# Patient Record
Sex: Male | Born: 1950 | Race: White | Hispanic: No | Marital: Married | Smoking: Former smoker
Health system: Southern US, Community
[De-identification: ages and names within clinical notes are randomized; demographics above are authoritative.]

## PROBLEM LIST (undated history)

## (undated) DIAGNOSIS — Z9289 Personal history of other medical treatment: Secondary | ICD-10-CM

## (undated) DIAGNOSIS — I739 Peripheral vascular disease, unspecified: Secondary | ICD-10-CM

## (undated) DIAGNOSIS — E785 Hyperlipidemia, unspecified: Secondary | ICD-10-CM

## (undated) DIAGNOSIS — R0602 Shortness of breath: Secondary | ICD-10-CM

## (undated) DIAGNOSIS — L89519 Pressure ulcer of right ankle, unspecified stage: Secondary | ICD-10-CM

## (undated) DIAGNOSIS — N189 Chronic kidney disease, unspecified: Secondary | ICD-10-CM

## (undated) DIAGNOSIS — I2510 Atherosclerotic heart disease of native coronary artery without angina pectoris: Secondary | ICD-10-CM

## (undated) DIAGNOSIS — M79606 Pain in leg, unspecified: Secondary | ICD-10-CM

## (undated) DIAGNOSIS — I509 Heart failure, unspecified: Secondary | ICD-10-CM

## (undated) DIAGNOSIS — M069 Rheumatoid arthritis, unspecified: Secondary | ICD-10-CM

## (undated) DIAGNOSIS — I10 Essential (primary) hypertension: Secondary | ICD-10-CM

## (undated) DIAGNOSIS — A419 Sepsis, unspecified organism: Secondary | ICD-10-CM

## (undated) DIAGNOSIS — K219 Gastro-esophageal reflux disease without esophagitis: Secondary | ICD-10-CM

## (undated) DIAGNOSIS — I219 Acute myocardial infarction, unspecified: Secondary | ICD-10-CM

## (undated) DIAGNOSIS — Z95810 Presence of automatic (implantable) cardiac defibrillator: Secondary | ICD-10-CM

## (undated) HISTORY — DX: Acute myocardial infarction, unspecified: I21.9

## (undated) HISTORY — PX: KNEE SURGERY: SHX244

## (undated) HISTORY — PX: CARPAL TUNNEL RELEASE: SHX101

## (undated) HISTORY — DX: Heart failure, unspecified: I50.9

## (undated) HISTORY — DX: Pain in leg, unspecified: M79.606

## (undated) HISTORY — PX: HERNIA REPAIR: SHX51

## (undated) HISTORY — DX: Hyperlipidemia, unspecified: E78.5

## (undated) HISTORY — PX: COLONOSCOPY: SHX174

## (undated) HISTORY — DX: Chronic kidney disease, unspecified: N18.9

## (undated) HISTORY — DX: Essential (primary) hypertension: I10

## (undated) HISTORY — DX: Gastro-esophageal reflux disease without esophagitis: K21.9

## (undated) HISTORY — DX: Atherosclerotic heart disease of native coronary artery without angina pectoris: I25.10

## (undated) HISTORY — PX: OTHER SURGICAL HISTORY: SHX169

---

## 1998-12-17 ENCOUNTER — Encounter (HOSPITAL_COMMUNITY): Admission: RE | Admit: 1998-12-17 | Discharge: 1999-03-17 | Payer: Self-pay

## 1999-03-27 ENCOUNTER — Encounter (HOSPITAL_COMMUNITY): Admission: RE | Admit: 1999-03-27 | Discharge: 1999-06-25 | Payer: Self-pay

## 1999-07-21 ENCOUNTER — Encounter (HOSPITAL_COMMUNITY): Admission: RE | Admit: 1999-07-21 | Discharge: 1999-10-19 | Payer: Self-pay

## 1999-11-07 ENCOUNTER — Encounter (HOSPITAL_COMMUNITY): Admission: RE | Admit: 1999-11-07 | Discharge: 2000-02-05 | Payer: Self-pay

## 2000-02-27 ENCOUNTER — Encounter (HOSPITAL_COMMUNITY): Admission: RE | Admit: 2000-02-27 | Discharge: 2000-05-27 | Payer: Self-pay

## 2000-07-02 ENCOUNTER — Encounter (HOSPITAL_COMMUNITY): Admission: RE | Admit: 2000-07-02 | Discharge: 2000-09-30 | Payer: Self-pay

## 2000-10-22 ENCOUNTER — Encounter (HOSPITAL_COMMUNITY): Admission: RE | Admit: 2000-10-22 | Discharge: 2001-01-20 | Payer: Self-pay

## 2001-02-18 ENCOUNTER — Encounter (HOSPITAL_COMMUNITY): Admission: RE | Admit: 2001-02-18 | Discharge: 2001-05-19 | Payer: Self-pay

## 2001-06-10 ENCOUNTER — Encounter (HOSPITAL_COMMUNITY): Admission: RE | Admit: 2001-06-10 | Discharge: 2001-09-08 | Payer: Self-pay

## 2002-03-30 HISTORY — PX: OTHER SURGICAL HISTORY: SHX169

## 2004-03-30 HISTORY — PX: CORONARY ARTERY BYPASS GRAFT: SHX141

## 2006-11-10 ENCOUNTER — Ambulatory Visit (HOSPITAL_COMMUNITY): Admission: RE | Admit: 2006-11-10 | Discharge: 2006-11-10 | Payer: Self-pay | Admitting: *Deleted

## 2009-03-30 HISTORY — PX: INSERTION OF ILIAC STENT: SHX6256

## 2010-01-15 ENCOUNTER — Ambulatory Visit: Payer: Self-pay | Admitting: Vascular Surgery

## 2010-01-20 ENCOUNTER — Ambulatory Visit: Payer: Self-pay | Admitting: Vascular Surgery

## 2010-01-20 ENCOUNTER — Ambulatory Visit (HOSPITAL_COMMUNITY): Admission: RE | Admit: 2010-01-20 | Discharge: 2010-01-20 | Payer: Self-pay | Admitting: Vascular Surgery

## 2010-02-26 ENCOUNTER — Ambulatory Visit: Payer: Self-pay | Admitting: Vascular Surgery

## 2010-06-11 LAB — POCT I-STAT, CHEM 8
BUN: 19 mg/dL (ref 6–23)
Calcium, Ion: 1.19 mmol/L (ref 1.12–1.32)
HCT: 40 % (ref 39.0–52.0)
Sodium: 137 mEq/L (ref 135–145)
TCO2: 25 mmol/L (ref 0–100)

## 2010-06-11 LAB — GLUCOSE, CAPILLARY
Glucose-Capillary: 106 mg/dL — ABNORMAL HIGH (ref 70–99)
Glucose-Capillary: 119 mg/dL — ABNORMAL HIGH (ref 70–99)

## 2010-08-12 NOTE — Procedures (Signed)
OPERATIVE REPORT   Dionisio, Kipton E  DOB:  [birth year]                                       01/15/2010  [id number]#:[id number]   Operative report on Julienne Kass.  Medical record number [id number].  Date of procedure, date of dictation is 01/20/2010.   PREOPERATIVE DIAGNOSIS:  Multilevel arterial occlusive disease.   POSTOPERATIVE DIAGNOSIS:  Multilevel arterial occlusive disease.   PROCEDURE:  1. Ultrasound guided access to the right common femoral artery.  2. Aortogram with bilateral iliac arteriogram.  3. Ultrasound guided access to the left common femoral artery.  4. PTA of the left external iliac artery with a Powerflex 5 x 4      balloon.  5. Placement of SMART stent 7 x 100 mm in the left external iliac      artery.  6. PTA of the left external iliac artery with a 7 x 3 Powerflex      balloon and PTA and stent of the left external iliac arm with a 5 x      4 Powerflex balloon.  7. Right lower extremity runoff.  8. Left lower extremity runoff.   I need to see him in 2-3 week or 3-4 weeks with ABIs either way; 95%  stenosis of the left external iliac artery  which was successfully  ballooned and stented.  Next, short segment left superficial femoral  artery occlusion.   INDICATIONS:  This is a 61 year old gentleman who I saw in consultation  on 01/15/2010 with progressive ischemic pain in the left foot.  He had  developed rest pain.  He had evidence of multilevel arterial occlusive  disease in the left and was brought in for arteriography.   TECHNIQUE:  The patient was taken to the [facility] lab.    DICTATION ENDS HERE   [name], M.D.  Electronically Signed   CSD/MEDQ  D:  01/20/2010  T:  01/20/2010  Job:  [id number]   cc:   Earvin Hansen, MD

## 2010-08-12 NOTE — Assessment & Plan Note (Signed)
OFFICE VISIT   [name], [name]  DOB:  17-Oct-1950                                       02/26/2010  [id number]#:[id number]   I saw the patient in the office today for follow-up after his recent  left external iliac artery PTA and stent.  I had seen him in  consultation on 01/15/2010 with left leg pain.  He had been referred by  Dr. Dierdre Forth.  He was having claudication bilaterally but at a very short  distance in the left leg and also was having rest pain in the left foot.  He had evidence of multilevel arterial occlusive disease.  He underwent  an arteriogram on January 20, 2010, and had successful PTA and stenting  of a 95% left external iliac artery stenosis.  He had placement of a 7  mm x 10-cm SMART Stent with postdilatation with a 7-mm Powerflex  balloon.  He had an excellent result.  Below this he does have a  superficial femoral artery occlusion with reconstitution of the  popliteal artery and 3-vessel runoff on the left.   He states that his rest pain in the left foot has resolved.  He does  continue to have some calf claudication bilaterally but this has  improved on the left side since his intervention.  Symptoms are brought  on by ambulation and relieved with rest.  There are no other aggravating  or alleviating factors.  He has had no history of nonhealing wounds.   REVIEW OF SYSTEMS:  CARDIAC:  He has had no chest pain, chest pressure,  palpitations or arrhythmias.   PHYSICAL EXAMINATION:  This is a pleasant 60 year old gentleman who  appears his stated age.  Blood pressure is 146/84, heart rate is 75,  temperature is 98.1.  Lungs are clear bilaterally to auscultation.  He  has palpable femoral pulses bilaterally.  I cannot palpate pedal pulses;  however, both feet are warm and well-perfused without ischemic ulcers.  The groins look fine without evidence of hematoma.   I did independently interpret his arterial Doppler study today which  shows an ABI of 58% on the left, with his increased from 32% compared to  prior to his angioplasty.  On the right side he has an ABI of 77%.  He  has monophasic Doppler signals in both feet.   Overall, I am pleased his progress.  I have ordered a follow-up duplex  of his stent in 6 months.  I will plan on seeing him back in 1 year  unless there is any problem noted on his return follow-up for his  ultrasound.  I have encouraged him to try to do as much walking as  possible.  Fortunately, he is not a smoker.  Of note, he is on Plavix,  which he had been on prior to the procedure.     [name], M.D.  Electronically Signed   CSD/MEDQ  D:  02/26/2010  T:  02/27/2010  Job:  [id number]   cc:   [name] Hansen, MD

## 2010-08-12 NOTE — Op Note (Signed)
NAMESUHEYB, RAUCCI           ACCOUNT NO.:  [id number]   MEDICAL RECORD NO.:  [id number]          PATIENT TYPE:  AMB   LOCATION:  DAY                          FACILITY:  [facility]   PHYSICIAN:  Alfonse Ras, MD   DATE OF BIRTH:  [birth year]   DATE OF PROCEDURE:  11/10/2006  DATE OF DISCHARGE:                               OPERATIVE REPORT   PREOPERATIVE DIAGNOSIS:  Umbilical hernia.   POSTOPERATIVE DIAGNOSIS:  Umbilical hernia.   PROCEDURE:  Umbilical hernia repair.   SURGEON:  Baruch Merl, M.D.   ANESTHESIA:  General.   DESCRIPTION:  The patient was taken to the operating room, placed in a  supine position.  After adequate general anesthesia was induced using  laryngeal mask, abdomen was prepped and draped in normal sterile  fashion.  Using a transverse supraumbilical incision, I dissected down  onto the umbilical sac.  The hernia sac was mobilized off the fascia and  reduced into the abdominal cavity.  It had a mostly fat and omental  contents.  The defect itself was very small measuring only about 1 cm.  This was closed with two interrupted 0 Surgilon sutures in a figure-of-  eight fashion.  Adequate hemostasis was ensured and the skin was closed  with subcuticular 3-0 Monocryl.  Steri-Strips and dressings were  applied.  The patient tolerated the procedure well went to PACU in good  condition.      Alfonse Ras, MD  Electronically Signed     KRE/MEDQ  D:  11/10/2006  T:  11/11/2006  Job:  [id number]

## 2010-08-12 NOTE — Consult Note (Signed)
NEW PATIENT CONSULTATION   [name] [name], [name] [name]  DOB:  Mar 16, 1951                                       01/15/2010  EAVWU#:[id number]   I saw the patient in the office today in consultation concerning his  left leg pain.  He is referred by Dr. Dierdre Forth.  This is a pleasant 60-  year-old gentleman who states that in August he noted the gradual onset  of pain in the left leg that was brought on by ambulation and relieved  with rest.  He has calf claudication at short distance on the left side.  There are no other aggravating or alleviating factors.  He has minimal  claudication symptoms on the right in right calf.  The pain progressed  to the point where he is developing rest pain on the left which began  gradually in August.  These symptoms have persisted.  He had no history  of nonhealing wounds.   PAST MEDICAL HISTORY:  Significant for type 2 diabetes, hypertension,  hypercholesterolemia, and rheumatoid arthritis.  He also has a history  of coronary artery disease and had a myocardial infarction in 2000, also  2006.  He underwent coronary revascularization in [place], [place],  with vein taken from the left leg.  He denies any history of congestive  heart failure or history of emphysema.   SOCIAL HISTORY:  Married, he has 3 children.  He is on disability.  He  quit tobacco in 1999.   FAMILY HISTORY:  He is unaware of any history of premature  cardiovascular disease.   REVIEW OF SYSTEMS:  GENERAL:  He is 5 feet 9 inches, weight 123 pounds.  He had no recent weight loss, weight gain about his appetite.  CARDIOVASCULAR:  He had no chest pain, chest pressure, palpitations or  arrhythmias.  He does admit to dyspnea on exertion.  He denies any  history of stroke, TIAs, expressive or receptive aphasia or amaurosis  fugax.  He has had no history of DVT or phlebitis.  GI:  He does have a history of reflux.  PULMONARY:  He has a history of wheezing.  MUSCULOSKELETAL:  Does have a history of rheumatoid arthritis and has  significant joint pain and muscle pain.  NEUROLOGIC, HEMATOLOGIC, GU,  ENT, PSYCHIATRIC, INTEGUMENTARY:  Unremarkable as documented on the  medical history form in his chart.   PHYSICAL EXAMINATION:  This is a pleasant 60 year old gentleman who  appears his stated age.  Blood pressure 157/82, heart rate is 76,  respiratory rate 20.  I calculated his BMI at 33.  He is moderately  obese.  HEENT:  Unremarkable.  Lungs:  Clear bilaterally to auscultation  without rales, rhonchi or wheezing.  Cardiovascular:  I do not detect  any carotid bruits.  He has a regular rate and rhythm.  He has a  palpable right femoral pulse.  I cannot palpate a left femoral pulse.  I  cannot palpate popliteal or pedal pulses on either side.  Abdomen:  Soft  and nontender with normal pitched bowel sounds.  It is somewhat  difficult to assess for aneurysm due to his size.  Musculoskeletal:  No  major deformities or cyanosis.  Neurologic:  He has no focal weakness or  paresthesias.  Skin:  There are no ulcers or rashes.   I have reviewed his arterial Doppler study  from [facility]  in [place].  This showed an ABI of 32% on the right and 64% on the  left.  This was on December 20, 2009.  Wave form analysis suggests  iliac disease on the left but I suspect he has multilevel arterial  occlusive disease.   I have also reviewed his labs which would done on 12/16/2009, which  shows a white blood cell count 9, H and H 13 and 40,  creatinine is 1.0.   I did perform an arterial Doppler study myself and obtained brisk  monophasic Doppler signals in dorsalis pedis and posterior tibial  positions on the right with dampened monophasic signals in the dorsalis  pedis and posterior tibial positions on the left.   Based on his exam, he has evidence of multilevel arterial occlusive  disease on the left and now has rest pain.  This could  clearly become a  limb threatening problem.  I have recommend we proceed with  arteriography to evaluate his options for revascularization.  If he had  an iliac lesion amenable to angioplasty, this could potentially be  addressed at the same time.  We have discussed the indications for  arteriography and the potential complications including, but not limited  to, bleeding, arterial injury and renal insufficiency.  In addition, we  have discussed potential complications of iliac angioplasty including  but not limited to bleeding, arterial injury or arterial thrombosis.  All his questions were answered and he is agreeable to proceed.  We will  make further recommendations pending the results.  Currently he is on  Plavix.   Of note, if he does require revascularization, his options may be  limited as he had vein taken from the left leg for his previous open  heart surgery.  In addition, he would need preoperative cardiac  evaluation given his heart history.  He does not have a cardiologist  currently.     [name], M.D.  Electronically Signed   CSD/MEDQ  D:  01/15/2010  T:  01/16/2010  Job:  [id number]   cc:   Earvin Hansen, MD

## 2010-08-27 ENCOUNTER — Encounter (INDEPENDENT_AMBULATORY_CARE_PROVIDER_SITE_OTHER): Payer: Medicare Other

## 2010-08-27 DIAGNOSIS — I739 Peripheral vascular disease, unspecified: Secondary | ICD-10-CM

## 2010-08-27 DIAGNOSIS — Z48812 Encounter for surgical aftercare following surgery on the circulatory system: Secondary | ICD-10-CM

## 2010-09-01 NOTE — Procedures (Unsigned)
LOWER EXTREMITY ARTERIAL DUPLEX  INDICATION:  Left EIA stent placed 01/20/2010, now with left lower extremity ulceration.  HISTORY: Diabetes:  Yes. Cardiac:  Yes. Hypertension:  Yes. Smoking:  Previous. Previous Surgery:  See above.  SINGLE LEVEL ARTERIAL EXAM                         RIGHT                LEFT Brachial:               151                  156 Anterior tibial:        120                  88 Posterior tibial:       112                  73 Peroneal:                    Ankle/Brachial Index:   0.77  0.56  Previous ABI date 02/26/2010:  Right 0.77, left 0.58.  LOWER EXTREMITY ARTERIAL DUPLEX EXAM  DUPLEX:  Stable ABIs compared to prior exam.  Widely patent left EIA stent with triphasic waveforms observed in the left common iliac, the EIA stent, and the native EIA outflow.  IMPRESSION:  Patent left EIA stent without evidence of hyperplasia or restenosis.  ___________________________________________ [name], M.D.  LT/MEDQ  D:  08/27/2010  T:  08/27/2010  Job:  [id number]

## 2011-01-12 LAB — URINALYSIS, ROUTINE W REFLEX MICROSCOPIC
Bilirubin Urine: NEGATIVE
Hgb urine dipstick: NEGATIVE
Ketones, ur: NEGATIVE
Nitrite: NEGATIVE
Protein, ur: NEGATIVE
Specific Gravity, Urine: 1.014
Urobilinogen, UA: 0.2

## 2011-01-12 LAB — BASIC METABOLIC PANEL
BUN: 13
CO2: 26
Calcium: 9.9
Creatinine, Ser: 0.85
GFR calc non Af Amer: >60
Glucose, Bld: 207 — ABNORMAL HIGH

## 2011-02-10 ENCOUNTER — Encounter: Payer: Self-pay | Admitting: Vascular Surgery

## 2011-03-03 ENCOUNTER — Encounter: Payer: Self-pay | Admitting: Vascular Surgery

## 2011-03-04 ENCOUNTER — Encounter: Payer: Self-pay | Admitting: Vascular Surgery

## 2011-03-04 ENCOUNTER — Encounter (INDEPENDENT_AMBULATORY_CARE_PROVIDER_SITE_OTHER): Payer: Medicare Other | Admitting: *Deleted

## 2011-03-04 ENCOUNTER — Ambulatory Visit (INDEPENDENT_AMBULATORY_CARE_PROVIDER_SITE_OTHER): Payer: Medicare Other | Admitting: Vascular Surgery

## 2011-03-04 VITALS — BP 151/94 | HR 70 | Resp 16 | Ht 69.0 in | Wt 222.0 lb

## 2011-03-04 DIAGNOSIS — I70229 Atherosclerosis of native arteries of extremities with rest pain, unspecified extremity: Secondary | ICD-10-CM

## 2011-03-04 DIAGNOSIS — Z48812 Encounter for surgical aftercare following surgery on the circulatory system: Secondary | ICD-10-CM

## 2011-03-04 DIAGNOSIS — I745 Embolism and thrombosis of iliac artery: Secondary | ICD-10-CM

## 2011-03-04 NOTE — Progress Notes (Signed)
[facility] [place]  Patient name: [name] [name] MRN: [id number] DOB: Sep 05, 1950 Sex: male  CC: Follow up of peripheral vascular disease.  HPI: [name] [name] is a 60 y.o. male with a history of multilevel arterial occlusive disease. He underwent PTA and stenting of a left external iliac artery stenosis on 01/20/2010. He comes in for a routine follow up visit. He continues to have some claudication in both calves which is brought on by ambulation and relieved with rest. He symptoms have remained stable over the last 6 months. There are no other aggravating or alleviating factors. He occasionally gets some paresthesias in his left foot but has had no rest pain.  The patient does complain of some left arm pain which is aggravated when he sleeps on it. Sounds like he could potentially have a rotator cuff problem.  Past Medical History  Diagnosis Date  . Diabetes mellitus   . Hypertension   . Hyperlipidemia   . Arthritis   . CAD (coronary artery disease)   . Myocardial infarction 2000 & 2006  . GERD (gastroesophageal reflux disease)   . Leg pain     Family History  Problem Relation Age of Onset  . Hyperlipidemia Mother   . Diabetes Father   . Hypertension Father   . Heart disease Father     SOCIAL HISTORY: History  Substance Use Topics  . Smoking status: Former Smoker    Types: Cigarettes    Quit date: 03/30/1997  . Smokeless tobacco: Not on file  . Alcohol Use: 4.2 oz/week    7 Glasses of wine per week    Allergies  Allergen Reactions  . Methotrexate Derivatives   . Remicade (Infliximab)   . Zantac     Current Outpatient Prescriptions  Medication Sig Dispense Refill  . adalimumab (HUMIRA) 40 MG/0.8ML injection Inject 40 mg into the skin once.        Marland Kitchen aspirin EC 81 MG tablet Take 81 mg by mouth daily.        . Calcium Carb-Cholecalciferol (CALCIUM 1000 + D PO) Take 1 tablet by mouth daily.        . calcium carbonate (TUMS - DOSED IN  MG ELEMENTAL CALCIUM) 500 MG chewable tablet Chew 1 tablet by mouth as needed.        . clopidogrel (PLAVIX) 75 MG tablet Take 75 mg by mouth daily.        Marland Kitchen ezetimibe (ZETIA) 10 MG tablet Take 10 mg by mouth daily.        . ferrous gluconate (FERGON) 325 MG tablet Take 325 mg by mouth daily with breakfast.        . fish oil-omega-3 fatty acids 1000 MG capsule Take 2 g by mouth daily.        Marland Kitchen glyBURIDE-metformin (GLUCOVANCE) 1.25-250 MG per tablet Take 1 tablet by mouth 2 (two) times daily with a meal.        . glyBURIDE-metformin (GLUCOVANCE) 5-500 MG per tablet Take 1 tablet by mouth 2 (two) times daily with a meal.        . HYDROcodone-acetaminophen (LORCET) 10-650 MG per tablet Take 1 tablet by mouth every 6 (six) hours as needed.        . Ibuprofen (ADVIL PO) Take 6 tablets by mouth daily.        . metoprolol (TOPROL-XL) 50 MG 24 hr tablet Take 50 mg by mouth 2 (two) times daily.        Marland Kitchen  pantoprazole (PROTONIX) 40 MG tablet Take 80 mg by mouth daily.        . potassium chloride SA (K-DUR,KLOR-CON) 20 MEQ tablet Take 20 mEq by mouth 2 (two) times daily.        . sitaGLIPtin (JANUVIA) 100 MG tablet Take 100 mg by mouth daily.        . valsartan-hydrochlorothiazide (DIOVAN-HCT) 320-12.5 MG per tablet Take 1 tablet by mouth daily.          REVIEW OF SYSTEMS: Arly.Keller ] denotes positive finding; [  ] denotes negative finding CARDIOVASCULAR:  [ ]  chest pain   [ ]  chest pressure   [ ]  palpitations   [ ]  orthopnea   [ ]  dyspnea on exertion   Arly.Keller ] claudication   [ ]  rest pain   [ ]  DVT   [ ]  phlebitis PULMONARY:   [ ]  productive cough   [ ]  asthma   [ ]  wheezing NEUROLOGIC:   [ ]  weakness  [ ]  paresthesias  [ ]  aphasia  [ ]  amaurosis  [ ]  dizziness HEMATOLOGIC:   [ ]  bleeding problems   [ ]  clotting disorders MUSCULOSKELETAL:  Arly.Keller ] joint pain left shoulder  [ ]  joint swelling [ ]  leg swelling GASTROINTESTINAL: [ ]   blood in stool  [ ]   hematemesis GENITOURINARY:  [ ]   dysuria  [ ]    hematuria PSYCHIATRIC:  [ ]  history of major depression INTEGUMENTARY:  [ ]  rashes  [ ]  ulcers CONSTITUTIONAL:  [ ]  fever   [ ]  chills  PHYSICAL EXAM: Filed Vitals:   03/04/11 1049  BP: 151/94  Pulse: 70  Resp: 16  Height: 5\' 9"  (1.753 m)  Weight: 222 lb (100.699 kg)  SpO2: 97%   Body mass index is 32.78 kg/(m^2). GENERAL: The patient is a well-nourished male, in no acute distress. The vital signs are documented above. CARDIOVASCULAR: There is a regular rate and rhythm without significant murmur appreciated. I do not detect any carotid bruits. He has palpable femoral pulses bilaterally. I cannot palpate popliteal or pedal pulses. PULMONARY: There is good air exchange bilaterally without wheezing or rales. ABDOMEN: Soft and non-tender with normal pitched bowel sounds. I cannot palpate an abdominal aortic aneurysm. MUSCULOSKELETAL: There are no major deformities or cyanosis. NEUROLOGIC: No focal weakness or paresthesias are detected. SKIN: There are no ulcers or rashes noted. PSYCHIATRIC: The patient has a normal affect.  DATA:  Have independently interpreted his arterial Doppler study today. This shows an ABI of 76% on the right and 62% on the left. These are stable compared with study from back in may.  Also independently interpreted his duplex of his stent which shows that the stent is patent without evidence of increased velocities within the stent.  I also reviewed his previous arteriogram which shows some mild disease in his right distal extrailiac artery. He has a short segment superficial femoral artery occlusion on the left also.  MEDICAL ISSUES: His vascular status remained stable. Fortunately he is not a smoker. I have encouraged him to stay as active as possible. He remains on aspirin and Plavix. I've ordered a follow up Doppler study in 6 months. I'll plan on seeing him back in one year and last is been some changes Doppler study in 6 months. With respect to his left arm  pain he has seen in orthopedic surgeon before in [place] and I've encouraged him to figure out to that was arrange follow up to evaluate his left shoulder.  Dvante Hands S [facility] [place] Office: [phone]

## 2011-03-11 NOTE — Procedures (Unsigned)
AORTA-ILIAC DUPLEX EVALUATION  INDICATION:  Left external iliac artery stent.  HISTORY: Diabetes:  Yes. Cardiac:  CAD. Hypertension:  No. Smoking:  Previous. Previous Surgery:  Left external iliac artery PTA/stent on 01/20/2010.              SINGLE LEVEL ARTERIAL EXAM                             RIGHT                  LEFT Brachial: Anterior tibial: Posterior tibial: Peroneal: Ankle/brachial index:        0.76                   0.62 Previous ABI/date: 08/27/2010                        0.77 0.56  AORTA-ILIAC DUPLEX EXAM Aorta - Proximal     Not visualized Aorta - Mid          Not visualized Aorta - Distal       Not visualized  RIGHT                                          LEFT                      CIA-PROXIMAL              Not visualized                      CIA-DISTAL                Not visualized                      HYPOGASTRIC               Not visualized                      EIA-PROXIMAL              159 cm/s                      EIA-MID                   149 cm/s                      EIA-DISTAL                131 cm/s  IMPRESSION: 1. Biphasic Doppler waveforms noted throughout the left external iliac     artery stent with no increased velocities. 2. The abdominal aorta and left common iliac arteries were not     adequately visualized due to overlying bowel gas patterns and     patient body habitus. 3. Bilateral ankle brachial indices are noted on a separate report.  ___________________________________________ [name], M.D.  CH/MEDQ  D:  03/04/2011  T:  03/04/2011  Job:  [id number]

## 2011-09-02 ENCOUNTER — Encounter: Payer: Self-pay | Admitting: Neurosurgery

## 2011-09-03 ENCOUNTER — Encounter: Payer: Self-pay | Admitting: Neurosurgery

## 2011-09-03 ENCOUNTER — Ambulatory Visit (INDEPENDENT_AMBULATORY_CARE_PROVIDER_SITE_OTHER): Payer: Medicare Other | Admitting: Vascular Surgery

## 2011-09-03 ENCOUNTER — Ambulatory Visit (INDEPENDENT_AMBULATORY_CARE_PROVIDER_SITE_OTHER): Payer: Medicare Other | Admitting: Neurosurgery

## 2011-09-03 VITALS — BP 162/92 | HR 72 | Resp 16 | Ht 69.0 in | Wt 220.3 lb

## 2011-09-03 DIAGNOSIS — I745 Embolism and thrombosis of iliac artery: Secondary | ICD-10-CM

## 2011-09-03 DIAGNOSIS — I739 Peripheral vascular disease, unspecified: Secondary | ICD-10-CM

## 2011-09-03 DIAGNOSIS — Z48812 Encounter for surgical aftercare following surgery on the circulatory system: Secondary | ICD-10-CM

## 2011-09-03 NOTE — Progress Notes (Signed)
[facility]  HISTORY AND PHYSICAL   CC: Annual followup for aortoiliac duplex history of left EIA stent October 2011 Referring Physician: Edilia Bo  History of Present Illness: 61 year old patient of Dr. Edilia Bo followed status post left TIA stent October 2011. Patient denies claudication. He has no rest pain. The patient does state that he is having radicular type symptoms right lower extremity after having been in a bent position for some time and upon arising had exquisite back pain and since that time has had this right posterior radicular pain.  Past Medical History  Diagnosis Date  . Diabetes mellitus   . Hypertension   . Hyperlipidemia   . Arthritis   . CAD (coronary artery disease)   . Myocardial infarction 2000 & 2006  . GERD (gastroesophageal reflux disease)   . Leg pain     ROS: [x]  Positive   [ ]  Denies    General: [ ]  Weight loss, [ ]  Fever, [ ]  chills Neurologic: [ ]  Dizziness, [ ]  Blackouts, [ ]  Seizure [ ]  Stroke, [ ]  "Mini stroke", [ ]  Slurred speech, [ ]  Temporary blindness; [x ] weakness in arms or legs, [ ]  Hoarseness Cardiac: [ ]  Chest pain/pressure, [ ]  Shortness of breath at rest [x ] Shortness of breath with exertion, [ ]  Atrial fibrillation or irregular heartbeat Vascular: [ ]  Pain in legs with walking, [ ]  Pain in legs at rest, [ ]  Pain in legs at night,  [ ]  Non-healing ulcer, [ ]  Blood clot in vein/DVT,   Pulmonary: [ ]  Home oxygen, [ ]  Productive cough, [ ]  Coughing up blood, [ ]  Asthma,  [ ]  Wheezing Musculoskeletal:  [ ]  Arthritis, [x ] Low back pain, with posterior radicular symptoms on the right [ ]  Joint pain Hematologic: [ ]  Easy Bruising, [ ]  Anemia; [ ]  Hepatitis Gastrointestinal: [ ]  Blood in stool, [ ]  Gastroesophageal Reflux/heartburn, [ ]  Trouble swallowing Urinary: [ ]  chronic Kidney disease, [ ]  on HD - [ ]  MWF or [ ]  TTHS, [ ]  Burning with urination, [ ]  Difficulty urinating Skin: [ ]  Rashes, [ ]   Wounds Psychological: [ ]  Anxiety, [ ]  Depression   Social History History  Substance Use Topics  . Smoking status: Former Smoker    Types: Cigarettes    Quit date: 03/30/1997  . Smokeless tobacco: Not on file  . Alcohol Use: 4.2 oz/week    7 Glasses of wine per week    Family History Family History  Problem Relation Age of Onset  . Hyperlipidemia Mother   . Diabetes Father   . Hypertension Father   . Heart disease Father     Allergies  Allergen Reactions  . Methotrexate Derivatives   . Ranitidine Hcl   . Remicade (Infliximab)     Current Outpatient Prescriptions  Medication Sig Dispense Refill  . adalimumab (HUMIRA) 40 MG/0.8ML injection Inject 40 mg into the skin once.        Marland Kitchen aspirin EC 81 MG tablet Take 81 mg by mouth daily.        . Calcium Carb-Cholecalciferol (CALCIUM 1000 + D PO) Take 1 tablet by mouth daily.        . calcium carbonate (TUMS - DOSED IN MG ELEMENTAL CALCIUM) 500 MG chewable tablet Chew 1 tablet by mouth as needed.        . clopidogrel (PLAVIX) 75 MG tablet Take 75 mg by mouth daily.        Marland Kitchen ezetimibe (  ZETIA) 10 MG tablet Take 10 mg by mouth daily.        . ferrous gluconate (FERGON) 325 MG tablet Take 325 mg by mouth daily with breakfast.        . fish oil-omega-3 fatty acids 1000 MG capsule Take 2 g by mouth daily.        Marland Kitchen glyBURIDE-metformin (GLUCOVANCE) 1.25-250 MG per tablet Take 1 tablet by mouth 2 (two) times daily with a meal.        . glyBURIDE-metformin (GLUCOVANCE) 5-500 MG per tablet Take 1 tablet by mouth 2 (two) times daily with a meal.        . HYDROcodone-acetaminophen (LORCET) 10-650 MG per tablet Take 1 tablet by mouth every 6 (six) hours as needed.        . Ibuprofen (ADVIL PO) Take 6 tablets by mouth daily.        . metoprolol (LOPRESSOR) 50 MG tablet Take 50 mg by mouth Twice daily.      . metoprolol (TOPROL-XL) 50 MG 24 hr tablet Take 50 mg by mouth 2 (two) times daily.        . pantoprazole (PROTONIX) 40 MG tablet Take 80  mg by mouth daily.        . potassium chloride SA (K-DUR,KLOR-CON) 20 MEQ tablet Take 20 mEq by mouth 2 (two) times daily.        . predniSONE (DELTASONE) 5 MG tablet Take 5 mg by mouth Daily.      . sitaGLIPtin (JANUVIA) 100 MG tablet Take 100 mg by mouth daily.        . valsartan-hydrochlorothiazide (DIOVAN-HCT) 320-12.5 MG per tablet Take 1 tablet by mouth daily.          Physical Examination  Filed Vitals:   09/03/11 1116  BP: 162/92  Pulse: 72  Resp: 16    Body mass index is 32.53 kg/(m^2).  General:  WDWN in NAD Gait: Normal HEENT: WNL Eyes: Pupils equal Pulmonary: normal non-labored breathing , without Rales, rhonchi,  wheezing Cardiac: RRR, without  Murmurs, rubs or gallops; No carotid bruits Abdomen: soft, NT, no masses Skin: no rashes, ulcers noted Vascular Exam/Pulses: Patient has palpable femoral pulses bilaterally dampened PT and DP pulses, 2+ radial pulses bilaterally  Extremities without ischemic changes, no Gangrene , no cellulitis; no open wounds;  Musculoskeletal: no muscle wasting or atrophy  Neurologic: A&O X 3; Appropriate Affect ; SENSATION: normal; MOTOR FUNCTION:  moving all extremities equally. Speech is fluent/normal  Non-Invasive Vascular Imaging: ABIs today show 0.82 on the right 0.67 on the left which is a slight increase from previous exam. Toe pressures 0.39 on the right 0.22 on the left, proximal aorta is 108 mid aorta is 104 distal aorta is 0.74 CIA proximal is 72 on the right 113 on the left, CIA distal 116 on the right 170 left, CIA proximal to 45 over left 241 on the right, TIA mid 565 on the right 303 on the left and EIA distal is 109 on the right 195 on the left  ASSESSMENT/PLAN: I discussed the above findings with Dr. Darrick Penna who states since the patient does not have signs of claudication he can return in 6 months for repeat studies and to be seen by Dr. Edilia Bo. The patient and his wife are in agreement with this, their questions were  encouraged and answered. The patient and his wife did ask about referral to a neurosurgeon about his back and deferred that to his primary care.  [facility] ANP  Clinic M.D.: [place]

## 2011-09-03 NOTE — Progress Notes (Signed)
ABI performed @ [facility] 09/03/2011 

## 2011-09-10 NOTE — Procedures (Unsigned)
AORTA-ILIAC DUPLEX EVALUATION  INDICATION:  Peripheral vascular disease.  HISTORY: Diabetes:  Yes. Cardiac:  MI, CABG, CAD. Hypertension:  Yes. Smoking:  Previous. Previous Surgery:  Left external iliac artery stent, 01/20/2010.              SINGLE LEVEL ARTERIAL EXAM                             RIGHT                  LEFT Brachial:                  0.82                   0.67 Anterior tibial: Posterior tibial: Peroneal: Ankle/brachial index: Previous ABI/date: 03/04/2011                     0.76 0.62  TOE BRACHIAL INDEX:  Right: 0.39,  Left: 0.22  AORTA-ILIAC DUPLEX EXAM Aorta - Proximal     108 cm/s Aorta - Mid          104 cm/s Aorta - Distal       74 cm/s  RIGHT                                   LEFT 72 cm/s           CIA-PROXIMAL          113 cm/s 116 cm/s          CIA-DISTAL            170 cm/s Not visualized    HYPOGASTRIC           Not visualized 241 cm/s          EIA-PROXIMAL          245 cm/s 565 cm/s          EIA-MID               303 cm/s 109 cm/s          EIA-DISTAL            195 cm/s   IMPRESSION: 1. Technically difficult and limited study due to excessive bowel gas     and body habitus, study performed in left and right lateral     decubitus. 2. Patent abdominal aorta when and where visualized. 3. Bilateral common iliac arteries appear patent. 4. Mid right external iliac artery presents with elevated velocities,     suggesting greater than 75% stenosis. 5. Left external iliac artery presents with elevated velocities which     may be normal post stent placement, no post-stenotic turbulence is     identified. Stent could not be visualized due to above mentioned. 6. Stable ankle-brachial indices since previous study on 03/04/2011. 7. Abnormal bilateral toe-brachial indices.  ___________________________________________ [name], M.D.  SH/MEDQ  D:  09/03/2011  T:  09/03/2011  Job:  [id number]

## 2012-03-08 ENCOUNTER — Encounter: Payer: Self-pay | Admitting: Vascular Surgery

## 2012-03-09 ENCOUNTER — Encounter: Payer: Self-pay | Admitting: Vascular Surgery

## 2012-03-09 ENCOUNTER — Encounter (INDEPENDENT_AMBULATORY_CARE_PROVIDER_SITE_OTHER): Payer: Medicare Other | Admitting: *Deleted

## 2012-03-09 ENCOUNTER — Ambulatory Visit (INDEPENDENT_AMBULATORY_CARE_PROVIDER_SITE_OTHER): Payer: Medicare Other | Admitting: Vascular Surgery

## 2012-03-09 ENCOUNTER — Other Ambulatory Visit (INDEPENDENT_AMBULATORY_CARE_PROVIDER_SITE_OTHER): Payer: Medicare Other | Admitting: *Deleted

## 2012-03-09 VITALS — BP 156/68 | HR 85 | Resp 16 | Ht 69.0 in | Wt 216.0 lb

## 2012-03-09 DIAGNOSIS — Z48812 Encounter for surgical aftercare following surgery on the circulatory system: Secondary | ICD-10-CM

## 2012-03-09 DIAGNOSIS — I739 Peripheral vascular disease, unspecified: Secondary | ICD-10-CM

## 2012-03-09 NOTE — Assessment & Plan Note (Addendum)
This patient's left external iliac artery stent is patent. He does have some mildly elevated velocities in the mid external iliac artery. However his ABIs and actually improved from his previous study in June. These may be falsely elevated however because of his diabetes and calcific disease. However I do not get any history of rest pain or nonhealing ulcers. He does have stable claudication. I have recommended a follow up duplex in 6 months with ABIs and I'll see him back at that time. He knows to call sooner if he has problems. If his velocities in the left external iliac artery stent progressed significantly we could consider arteriography to further evaluate this.

## 2012-03-09 NOTE — Progress Notes (Signed)
[facility] [place]  Patient name: [name] [name] MRN: [id number] DOB: 1950-11-18 Sex: male  REASON FOR VISIT: follow up of peripheral vascular disease  HPI: [name] [name] is a 61 y.o. male who had a left external iliac artery stent performed on 01/20/2010. He was seen back in June with stable claudication and some mildly elevated velocities in his external iliac artery with an ABI of 76% on the right and 62% on the left. He comes in for a follow up visit. His main complaint has been pain in his right hip which seems to be related to certain positions. I do not get any history to suggest hip claudication on the right. He does have claudication of both calves at 1-2 blocks. His symptoms are brought on by ambulation and relieved with rest. He denies rest pain or nonhealing ulcers. Does have some pain in his toes likely consistent with neuropathy.  Past Medical History  Diagnosis Date  . Diabetes mellitus   . Hypertension   . Hyperlipidemia   . Arthritis   . CAD (coronary artery disease)   . Myocardial infarction 2000 & 2006  . GERD (gastroesophageal reflux disease)   . Leg pain     Family History  Problem Relation Age of Onset  . Hyperlipidemia Mother   . Diabetes Father   . Hypertension Father   . Heart disease Father     SOCIAL HISTORY: History  Substance Use Topics  . Smoking status: Former Smoker    Types: Cigarettes    Quit date: 03/30/1997  . Smokeless tobacco: Not on file  . Alcohol Use: 4.2 oz/week    7 Glasses of wine per week    Allergies  Allergen Reactions  . Methotrexate Derivatives   . Ranitidine Hcl   . Remicade (Infliximab)     Current Outpatient Prescriptions  Medication Sig Dispense Refill  . aspirin EC 81 MG tablet Take 81 mg by mouth daily.        . Calcium Carb-Cholecalciferol (CALCIUM 1000 + D PO) Take 1 tablet by mouth daily.        . calcium carbonate (TUMS - DOSED IN MG ELEMENTAL CALCIUM) 500 MG chewable tablet  Chew 1 tablet by mouth as needed.        . clopidogrel (PLAVIX) 75 MG tablet Take 75 mg by mouth daily.        Elgie Collard SURECLICK 50 MG/ML injection Inject 50 mg into the skin once a week.      . ezetimibe (ZETIA) 10 MG tablet Take 10 mg by mouth daily.        . ferrous gluconate (FERGON) 325 MG tablet Take 325 mg by mouth daily with breakfast.        . fish oil-omega-3 fatty acids 1000 MG capsule Take 2 g by mouth daily.        Marland Kitchen glyBURIDE-metformin (GLUCOVANCE) 1.25-250 MG per tablet Take 1 tablet by mouth 2 (two) times daily with a meal.        . glyBURIDE-metformin (GLUCOVANCE) 5-500 MG per tablet Take 1 tablet by mouth 2 (two) times daily with a meal.        . HYDROcodone-acetaminophen (LORCET) 10-650 MG per tablet Take 1 tablet by mouth every 6 (six) hours as needed.        . Ibuprofen (ADVIL PO) Take 6 tablets by mouth daily.        Marland Kitchen leflunomide (ARAVA) 20 MG tablet Take 1 tablet by mouth daily.      Marland Kitchen  metoprolol (LOPRESSOR) 50 MG tablet Take 50 mg by mouth Twice daily.      . metoprolol (TOPROL-XL) 50 MG 24 hr tablet Take 50 mg by mouth 2 (two) times daily.        . pantoprazole (PROTONIX) 40 MG tablet Take 80 mg by mouth daily.        . potassium chloride SA (K-DUR,KLOR-CON) 20 MEQ tablet Take 20 mEq by mouth 2 (two) times daily.        . predniSONE (DELTASONE) 5 MG tablet Take 5 mg by mouth Daily.      . sitaGLIPtin (JANUVIA) 100 MG tablet Take 100 mg by mouth daily.        . valsartan-hydrochlorothiazide (DIOVAN-HCT) 320-12.5 MG per tablet Take 1 tablet by mouth daily.        Marland Kitchen adalimumab (HUMIRA) 40 MG/0.8ML injection Inject 40 mg into the skin once.          REVIEW OF SYSTEMS: Arly.Keller ] denotes positive finding; [  ] denotes negative finding  CARDIOVASCULAR:  [X]  chest pain   [ ]  chest pressure   [ ]  palpitations   [ ]  orthopnea   [ ]  dyspnea on exertion   Arly.Keller ] claudication   [ ]  rest pain   Arly.Keller ] DVT   [ ]  phlebitis PULMONARY:   [ ]  productive cough   [ ]  asthma   [ ]   wheezing NEUROLOGIC:   Arly.Keller ] weakness  Arly.Keller ] paresthesias  [ ]  aphasia  [ ]  amaurosis  [ ]  dizziness HEMATOLOGIC:   [ ]  bleeding problems   [ ]  clotting disorders MUSCULOSKELETAL:  [ ]  joint pain   [ ]  joint swelling [ ]  leg swelling GASTROINTESTINAL: [ ]   blood in stool  [ ]   hematemesis GENITOURINARY:  [ ]   dysuria  [ ]   hematuria PSYCHIATRIC:  [ ]  history of major depression INTEGUMENTARY:  [ ]  rashes  [ ]  ulcers CONSTITUTIONAL:  [ ]  fever   [ ]  chills  PHYSICAL EXAM: Filed Vitals:   03/09/12 1027  BP: 156/68  Pulse: 85  Resp: 16  Height: 5\' 9"  (1.753 m)  Weight: 216 lb (97.977 kg)  SpO2: 99%   Body mass index is 31.90 kg/(m^2). GENERAL: The patient is a well-nourished male, in no acute distress. The vital signs are documented above. CARDIOVASCULAR: There is a regular rate and rhythm. I do not detect carotid bruits. He has palpable femoral pulses. The right femoral pulse is slightly diminished. I cannot palpate pedal pulses. There is no significant lower extremity swelling. PULMONARY: There is good air exchange bilaterally without wheezing or rales. ABDOMEN: Soft and non-tender with normal pitched bowel sounds.  MUSCULOSKELETAL: There are no major deformities or cyanosis. NEUROLOGIC: No focal weakness or paresthesias are detected. SKIN: There are no ulcers or rashes noted. PSYCHIATRIC: The patient has a normal affect.  DATA:  I have independently interpreted his arterial Doppler study which shows some mildly elevated velocities in the mid left external iliac artery with a peak systolic velocity of 273 cm/s.  ABIs greater than 100% bilaterally with a toe brachial index of 0.34 on the right and 0.31 on the left. He has biphasic Doppler signals in both common iliac arteries with biphasic signals in the left external iliac artery and monophasic signals in the right external  Iliac artery.  MEDICAL ISSUES: Peripheral vascular disease, unspecified This patient's left external iliac  artery stent is patent. He does have some mildly elevated velocities in  the mid external iliac artery. However his ABIs and actually improved from his previous study in June. These may be falsely elevated however because of his diabetes and calcific disease. However I do not get any history of rest pain or nonhealing ulcers. He does have stable claudication. I have recommended a follow up duplex in 6 months with ABIs and I'll see him back at that time. He knows to call sooner if he has problems. If his velocities in the left external iliac artery stent progressed significantly we could consider arteriography to further evaluate this.   DICKSON,CHRISTOPHER S [facility] [place] [facility]: [phone]

## 2012-03-09 NOTE — Addendum Note (Signed)
Addended by: Sharee Pimple on: 03/09/2012 03:11 PM   Modules accepted: Orders

## 2012-04-06 DIAGNOSIS — Z95810 Presence of automatic (implantable) cardiac defibrillator: Secondary | ICD-10-CM

## 2012-04-06 HISTORY — DX: Presence of automatic (implantable) cardiac defibrillator: Z95.810

## 2012-04-08 HISTORY — PX: CARDIAC DEFIBRILLATOR PLACEMENT: SHX171

## 2012-09-06 ENCOUNTER — Encounter: Payer: Self-pay | Admitting: Vascular Surgery

## 2012-09-07 ENCOUNTER — Encounter: Payer: Self-pay | Admitting: Vascular Surgery

## 2012-09-07 ENCOUNTER — Other Ambulatory Visit: Payer: Medicare Other

## 2012-09-07 ENCOUNTER — Ambulatory Visit: Payer: Medicare Other | Admitting: Vascular Surgery

## 2012-09-07 ENCOUNTER — Other Ambulatory Visit (INDEPENDENT_AMBULATORY_CARE_PROVIDER_SITE_OTHER): Payer: Medicare Other | Admitting: *Deleted

## 2012-09-07 ENCOUNTER — Encounter (INDEPENDENT_AMBULATORY_CARE_PROVIDER_SITE_OTHER): Payer: Medicare Other | Admitting: *Deleted

## 2012-09-07 ENCOUNTER — Ambulatory Visit (INDEPENDENT_AMBULATORY_CARE_PROVIDER_SITE_OTHER): Payer: Medicare Other | Admitting: Vascular Surgery

## 2012-09-07 VITALS — BP 188/79 | HR 75 | Ht 69.0 in | Wt 206.0 lb

## 2012-09-07 DIAGNOSIS — I739 Peripheral vascular disease, unspecified: Secondary | ICD-10-CM

## 2012-09-07 DIAGNOSIS — Z48812 Encounter for surgical aftercare following surgery on the circulatory system: Secondary | ICD-10-CM

## 2012-09-07 NOTE — Addendum Note (Signed)
Addended by: Adria Dill L on: 09/07/2012 03:41 PM   Modules accepted: Orders

## 2012-09-07 NOTE — Progress Notes (Signed)
[facility] [place]  Patient name: [name] [name] MRN: [id number] DOB: Jan 14, 1951 Sex: male  REASON FOR VISIT: follow up of peripheral vascular disease.  HPI: [name] [name] is a 62 y.o. male with a history of multilevel arterial occlusive disease. In 2011 he underwent arteriogram and underwent PTA and stenting of the left external iliac artery. A 7 mm x 100 mm Smart stent was placed in ballooned up to 7 mm. He also has a known short segment SFA occlusion on the left. He had moderate disease of the distal extraoral iliac artery on the right at that time. He comes in for a routine follow up visit.  He has been having burning pain and paresthesias in both feet. He does have a history of diabetes and neuropathy. In addition, he has degenerative disc disease of his back and has been having paresthesias down his left leg when he is sitting area is trying to make arrangements to be evaluated for this. He does also describes some bilateral calf claudication which is more significant on the right side. I do not get any history of rest pain or history of nonhealing ulcers. He is not a smoker. He is on aspirin and Plavix.  Past Medical History  Diagnosis Date  . Diabetes mellitus   . Hypertension   . Hyperlipidemia   . Arthritis   . CAD (coronary artery disease)   . Myocardial infarction 2000 & 2006  . GERD (gastroesophageal reflux disease)   . Leg pain   . CHF (congestive heart failure)   . Chronic kidney disease    Family History  Problem Relation Age of Onset  . Hyperlipidemia Mother   . Diabetes Father   . Hypertension Father   . Heart disease Father    SOCIAL HISTORY: History  Substance Use Topics  . Smoking status: Former Smoker    Types: Cigarettes    Quit date: 03/30/1997  . Smokeless tobacco: Not on file  . Alcohol Use: 0.6 oz/week    1 Glasses of wine per week   Allergies  Allergen Reactions  . Methotrexate Derivatives   . Ranitidine Hcl   .  Remicade (Infliximab)    Current Outpatient Prescriptions  Medication Sig Dispense Refill  . Abatacept (ORENCIA [place]) Inject into the skin once a week.      . carvedilol (COREG) 25 MG tablet Take 1 tablet by mouth 2 (two) times daily.      . Cholecalciferol (VITAMIN D-3 PO) Take 1 tablet by mouth daily.      . clopidogrel (PLAVIX) 75 MG tablet Take 75 mg by mouth daily.        Marland Kitchen ezetimibe (ZETIA) 10 MG tablet Take 10 mg by mouth daily.        . ferrous gluconate (FERGON) 325 MG tablet Take 325 mg by mouth daily with breakfast.        . fish oil-omega-3 fatty acids 1000 MG capsule Take 2 g by mouth daily.        Marland Kitchen gabapentin (NEURONTIN) 600 MG tablet Take 1 tablet by mouth 2 (two) times daily.      Marland Kitchen glyBURIDE-metformin (GLUCOVANCE) 2.5-500 MG per tablet Take by mouth. 2 tablets q am, 1 tablet q hs      . hydrALAZINE (APRESOLINE) 25 MG tablet Take 1 tablet by mouth 2 (two) times daily.      Marland Kitchen HYDROcodone-acetaminophen (LORCET) 10-650 MG per tablet Take 1 tablet by mouth every 6 (six) hours as needed.        Marland Kitchen  Ibuprofen (ADVIL PO) Take 6 tablets by mouth daily.        . isosorbide mononitrate (IMDUR) 60 MG 24 hr tablet Take 1 tablet by mouth as directed.      . leflunomide (ARAVA) 20 MG tablet Take 1 tablet by mouth daily.      Marland Kitchen ORENCIA 125 MG/ML SOLN Inject into the skin once a week.      . pantoprazole (PROTONIX) 40 MG tablet Take 80 mg by mouth 2 (two) times daily.       . predniSONE (DELTASONE) 5 MG tablet Take 5 mg by mouth 2 (two) times daily.       . sitaGLIPtin (JANUVIA) 100 MG tablet Take 100 mg by mouth daily.        Marland Kitchen adalimumab (HUMIRA) 40 MG/0.8ML injection Inject 40 mg into the skin once.        Marland Kitchen aspirin EC 81 MG tablet Take 81 mg by mouth daily.        . Calcium Carb-Cholecalciferol (CALCIUM 1000 + D PO) Take 1 tablet by mouth daily.        . calcium carbonate (TUMS - DOSED IN MG ELEMENTAL CALCIUM) 500 MG chewable tablet Chew 1 tablet by mouth as needed.        Elgie Collard  SURECLICK 50 MG/ML injection Inject 50 mg into the skin once a week.      . glyBURIDE-metformin (GLUCOVANCE) 1.25-250 MG per tablet Take 1 tablet by mouth 2 (two) times daily with a meal.        . glyBURIDE-metformin (GLUCOVANCE) 5-500 MG per tablet Take 1 tablet by mouth 2 (two) times daily with a meal.        . metoprolol (LOPRESSOR) 50 MG tablet Take 50 mg by mouth Twice daily.      . metoprolol (TOPROL-XL) 50 MG 24 hr tablet Take 50 mg by mouth 2 (two) times daily.        . potassium chloride SA (K-DUR,KLOR-CON) 20 MEQ tablet Take 20 mEq by mouth 2 (two) times daily.        . valsartan-hydrochlorothiazide (DIOVAN-HCT) 320-12.5 MG per tablet Take 1 tablet by mouth daily.         No current facility-administered medications for this visit.   REVIEW OF SYSTEMS: Arly.Keller ] denotes positive finding; [  ] denotes negative finding  CARDIOVASCULAR:  [ ]  chest pain   [ ]  chest pressure   [ ]  palpitations   [ ]  orthopnea   [ ]  dyspnea on exertion   Arly.Keller ] claudication   [ ]  rest pain   [ ]  DVT   [ ]  phlebitis PULMONARY:   [ ]  productive cough   [ ]  asthma   [ ]  wheezing NEUROLOGIC:   Arly.Keller ] weakness  Arly.Keller ] paresthesias  [ ]  aphasia  [ ]  amaurosis  [ ]  dizziness HEMATOLOGIC:   [ ]  bleeding problems   [ ]  clotting disorders MUSCULOSKELETAL:  [ ]  joint pain   [ ]  joint swelling [ ]  leg swelling GASTROINTESTINAL: [ ]   blood in stool  [ ]   hematemesis GENITOURINARY:  [ ]   dysuria  [ ]   hematuria PSYCHIATRIC:  [ ]  history of major depression INTEGUMENTARY:  [ ]  rashes  [ ]  ulcers CONSTITUTIONAL:  [ ]  fever   [ ]  chills  PHYSICAL EXAM: Filed Vitals:   09/07/12 1018  BP: 188/79  Pulse: 75  Height: 5\' 9"  (1.753 m)  Weight: 206 lb (93.441 kg)  SpO2: 100%   Body mass index is 30.41 kg/(m^2). GENERAL: The patient is a well-nourished male, in no acute distress. The vital signs are documented above. CARDIOVASCULAR: There is a regular rate and rhythm. I do not detect carotid bruits. He has a diminished right  femoral pulse with a normal left femoral pulse. I cannot palpate popliteal or pedal pulses. Both feet appear adequately perfused. He has mild bilateral lower extremity swelling. PULMONARY: There is good air exchange bilaterally without wheezing or rales. ABDOMEN: Soft and non-tender with normal pitched bowel sounds.  MUSCULOSKELETAL: There are no major deformities or cyanosis. NEUROLOGIC: No focal weakness or paresthesias are detected. SKIN: There are no ulcers or rashes noted. PSYCHIATRIC: The patient has a normal affect.  DATA:  I have independently interpreted duplex of the iliac system on the left which shows some elevated velocities at the distal stent with a peak systolic velocity of 337 cm/s. ABIs could not be obtained because his arteries were noncompressible but the toe brachial index has dropped from 0.31 on 03/09/2012 to 0.24 today.  Arterial Doppler study shows monophasic Doppler signals in the dorsalis pedis and posterior tibial positions bilaterally.  MEDICAL ISSUES: This patient has multilevel arterial occlusive disease. He has likely develop some progression of his iliac artery disease on the right and also some intimal hyperplasia the distal stent on the left. We have discussed proceeding with arteriography to further evaluate this however he feels that he would prefer to wait and follow up in 6 months to determine if this has progressed. I think his symptoms in his legs may largely be related to his diabetic neuropathy and degenerative disc disease of his back, however I do think we need to follow his iliac disease closely as continued progression could result in an iliac occlusion and acute onset of worsening symptoms. However currently he does not wish to proceed with arteriography. I'll see him back in 6 months and have ordered a follow duplex and ABIs at that time. Past the duplex both his right and left iliac systems at that time. He does know to continue taking his aspirin and  Plavix.  Idona Stach S [facility] [place] [facility]: [phone]

## 2013-03-07 ENCOUNTER — Encounter: Payer: Self-pay | Admitting: Vascular Surgery

## 2013-03-08 ENCOUNTER — Ambulatory Visit (INDEPENDENT_AMBULATORY_CARE_PROVIDER_SITE_OTHER)
Admission: RE | Admit: 2013-03-08 | Discharge: 2013-03-08 | Disposition: A | Discharge status: Home / Self Care | Payer: Medicare Other | Source: Ambulatory Visit | Attending: Vascular Surgery | Admitting: Vascular Surgery

## 2013-03-08 ENCOUNTER — Ambulatory Visit (INDEPENDENT_AMBULATORY_CARE_PROVIDER_SITE_OTHER): Payer: Medicare Other | Admitting: Vascular Surgery

## 2013-03-08 ENCOUNTER — Other Ambulatory Visit (HOSPITAL_COMMUNITY): Payer: Medicare Other

## 2013-03-08 ENCOUNTER — Encounter: Payer: Self-pay | Admitting: Vascular Surgery

## 2013-03-08 ENCOUNTER — Other Ambulatory Visit: Payer: Self-pay | Admitting: *Deleted

## 2013-03-08 ENCOUNTER — Encounter (HOSPITAL_COMMUNITY): Payer: Medicare Other

## 2013-03-08 ENCOUNTER — Other Ambulatory Visit: Payer: Self-pay

## 2013-03-08 ENCOUNTER — Ambulatory Visit (HOSPITAL_COMMUNITY)
Admission: RE | Admit: 2013-03-08 | Discharge: 2013-03-08 | Disposition: A | Discharge status: Home / Self Care | Payer: Medicare Other | Source: Ambulatory Visit | Attending: Vascular Surgery | Admitting: Vascular Surgery

## 2013-03-08 VITALS — BP 137/78 | HR 136 | Ht 69.0 in | Wt 216.0 lb

## 2013-03-08 DIAGNOSIS — Z48812 Encounter for surgical aftercare following surgery on the circulatory system: Secondary | ICD-10-CM | POA: Insufficient documentation

## 2013-03-08 DIAGNOSIS — M7989 Other specified soft tissue disorders: Secondary | ICD-10-CM

## 2013-03-08 DIAGNOSIS — I739 Peripheral vascular disease, unspecified: Secondary | ICD-10-CM

## 2013-03-08 NOTE — Assessment & Plan Note (Signed)
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 I have recommended that we proceed with arteriography.I have reviewed with the patient the indications for arteriography. In addition, I have reviewed the potential complications of arteriography including but not limited to: Bleeding, arterial injury, arterial thrombosis, dye action, renal insufficiency, or other unpredictable medical problems. I have explained to the patient that if we find disease amenable to angioplasty we could potentially address this at the same time. I have discussed the potential complications of angioplasty and stenting, including but not limited to: Bleeding, arterial thrombosis, arterial injury, dissection, or the need for surgical intervention. I've explained that if we do the dressing any iliac disease with angioplasty and I do not think this will relieve his symptoms completely as there certainly a component of pain related to his degenerative disc disease of his back. He does have small calcified arteries and I've explained that this is associated with increased risk of complications. However, given the wound on the right foot I think this could quickly become a limb threatening situation. I will make further recommendations pending the results of his arteriogram.

## 2013-03-08 NOTE — Progress Notes (Signed)
[facility] [place]  Patient name: [name] [name] MRN: [id number] DOB: [birth year] Sex: male  REASON FOR VISIT: 6 month follow up visit.  HPI: [name] [name] is a 62 y.o. male who I last saw on 09/07/2012. He has a history of multilevel arterial occlusive disease. In 2011, he underwent PT and stenting of the left external iliac artery stenosis. He has a known short segment SFA occlusion on the left. At the time of his last follow up visit, ABIs could not be obtained because his arteries were noncompressible. I felt that he likely develop some progression of his iliac disease on the right and perhaps of intimal hyperplasia in the distal stent on the left. We consider proceeding with arteriography. He elected to continue with conservative treatment and comes in for a 6 month follow up visit. He was on aspirin and Plavix.   saw him last, he's had progressive pain in both lower extrmities, but more significantly on the right side. He experiences pain in his thighs hips and calves bilaterally which is brought on by ambulation and relieved somewhat with sitting. However, he also experiences symptoms with simply standing and has known significant degenerative disc disease of his back. He also has pain at rest. Some of this may be related to his back but I think he does have a component of rest pain on the right. He's also developed a small wound adjacent to his right lateral malleolus.  His only other complaint is bilateral lower extremity swelling. This came on gradually and both lower extremities.  Past Medical History  Diagnosis Date  . Diabetes mellitus   . Hypertension   . Hyperlipidemia   . Arthritis   . CAD (coronary artery disease)   . Myocardial infarction 2000 & 2006  . GERD (gastroesophageal reflux disease)   . Leg pain   . CHF (congestive heart failure)   . Chronic kidney disease    Family History  Problem Relation Age of Onset  . Hyperlipidemia  Mother   . Diabetes Father   . Hypertension Father   . Heart disease Father   . Heart attack Father    SOCIAL HISTORY: History  Substance Use Topics  . Smoking status: Former Smoker    Types: Cigarettes    Quit date: 03/30/1997  . Smokeless tobacco: Not on file  . Alcohol Use: 0.6 oz/week    1 Glasses of wine per week   Allergies  Allergen Reactions  . Methotrexate Derivatives   . Ranitidine Hcl   . Remicade [Infliximab]    Current Outpatient Prescriptions  Medication Sig Dispense Refill  . aspirin EC 81 MG tablet Take 81 mg by mouth daily.        . Calcium Carb-Cholecalciferol (CALCIUM 1000 + D PO) Take 1 tablet by mouth daily.        . carvedilol (COREG) 25 MG tablet Take 1 tablet by mouth 2 (two) times daily.      . clopidogrel (PLAVIX) 75 MG tablet Take 75 mg by mouth daily.        Marland Kitchen ezetimibe (ZETIA) 10 MG tablet Take 10 mg by mouth daily.        . ferrous gluconate (FERGON) 325 MG tablet Take 325 mg by mouth daily with breakfast.        . fish oil-omega-3 fatty acids 1000 MG capsule Take 2 g by mouth daily.        Marland Kitchen gabapentin (NEURONTIN) 600 MG tablet Take 1  tablet by mouth 2 (two) times daily.      Marland Kitchen glyBURIDE-metformin (GLUCOVANCE) 2.5-500 MG per tablet Take by mouth. 2 tablets q am, 1 tablet q hs      . hydrALAZINE (APRESOLINE) 25 MG tablet Take 1 tablet by mouth 2 (two) times daily.      Marland Kitchen HYDROcodone-acetaminophen (LORCET) 10-650 MG per tablet Take 1 tablet by mouth every 6 (six) hours as needed.        . isosorbide mononitrate (IMDUR) 60 MG 24 hr tablet Take 1 tablet by mouth as directed.      . leflunomide (ARAVA) 20 MG tablet Take 1 tablet by mouth daily.      . pantoprazole (PROTONIX) 40 MG tablet Take 80 mg by mouth 2 (two) times daily.       . predniSONE (DELTASONE) 5 MG tablet Take 5 mg by mouth 2 (two) times daily.       . sitaGLIPtin (JANUVIA) 100 MG tablet Take 100 mg by mouth daily.        . Abatacept (ORENCIA [place]) Inject into the skin once a week.        Marland Kitchen adalimumab (HUMIRA) 40 MG/0.8ML injection Inject 40 mg into the skin once.        . calcium carbonate (TUMS - DOSED IN MG ELEMENTAL CALCIUM) 500 MG chewable tablet Chew 1 tablet by mouth as needed.        . Cholecalciferol (VITAMIN D-3 PO) Take 1 tablet by mouth daily.      Elgie Collard SURECLICK 50 MG/ML injection Inject 50 mg into the skin once a week.      . glyBURIDE-metformin (GLUCOVANCE) 1.25-250 MG per tablet Take 1 tablet by mouth 2 (two) times daily with a meal.        . glyBURIDE-metformin (GLUCOVANCE) 5-500 MG per tablet Take 1 tablet by mouth 2 (two) times daily with a meal.        . Ibuprofen (ADVIL PO) Take 6 tablets by mouth daily.        . metoprolol (LOPRESSOR) 50 MG tablet Take 50 mg by mouth Twice daily.      . metoprolol (TOPROL-XL) 50 MG 24 hr tablet Take 50 mg by mouth 2 (two) times daily.        Marland Kitchen ORENCIA 125 MG/ML SOLN Inject into the skin once a week.      . potassium chloride SA (K-DUR,KLOR-CON) 20 MEQ tablet Take 20 mEq by mouth 2 (two) times daily.        . valsartan-hydrochlorothiazide (DIOVAN-HCT) 320-12.5 MG per tablet Take 1 tablet by mouth daily.        . valsartan-hydrochlorothiazide (DIOVAN-HCT) 80-12.5 MG per tablet Take 1 tablet by mouth daily.       No current facility-administered medications for this visit.   REVIEW OF SYSTEMS: Arly.Keller ] denotes positive finding; [  ] denotes negative finding  CARDIOVASCULAR:  [ ]  chest pain   [ ]  chest pressure   [ ]  palpitations   [ ]  orthopnea   [ ]  dyspnea on exertion   Arly.Keller ] claudication   Arly.Keller ] rest pain   [ ]  DVT   [ ]  phlebitis PULMONARY:   [ ]  productive cough   [ ]  asthma   [ ]  wheezing NEUROLOGIC:   [ ]  weakness  [ ]  paresthesias  [ ]  aphasia  [ ]  amaurosis  [ ]  dizziness HEMATOLOGIC:   [ ]  bleeding problems   [ ]  clotting disorders MUSCULOSKELETAL:  [ ]   joint pain   [ ]  joint swelling Arly.Keller ] leg swelling GASTROINTESTINAL: [ ]   blood in stool  [ ]   hematemesis GENITOURINARY:  [ ]   dysuria  [ ]   hematuria PSYCHIATRIC:   [ ]  history of major depression INTEGUMENTARY:  [ ]  rashes  Arly.Keller ] ulcers CONSTITUTIONAL:  [ ]  fever   [ ]  chills  PHYSICAL EXAM: Filed Vitals:   03/08/13 1005  BP: 137/78  Pulse: 136  Height: 5\' 9"  (1.753 m)  Weight: 216 lb (97.977 kg)  SpO2: 99%   Body mass index is 31.88 kg/(m^2). GENERAL: The patient is a well-nourished male, in no acute distress. The vital signs are documented above. CARDIOVASCULAR: There is a regular rate and rhythm. I do not detect carotid bruits. I cannot palpate a right femoral pulse. He has a slightly diminished left femoral pulse. I cannot palpate popliteal or pedal pulses. He has bilateral lower extremity swelling. PULMONARY: There is good air exchange bilaterally without wheezing or rales. ABDOMEN: Soft and non-tender with normal pitched bowel sounds.  MUSCULOSKELETAL: There are no major deformities or cyanosis. NEUROLOGIC: No focal weakness or paresthesias are detected. SKIN: He has a small ulcer adjacent to his right lateral malleolus that is approximately 1 cm in diameter. PSYCHIATRIC: The patient has a normal affect.  DATA:  I have independently interpreted his arterial Doppler study today. His left external iliac artery stent is patent with suggestion of a greater than 50% stenosis at the mid external iliac artery. Visualization was somewhat limited. He also appears to have significant disease of his right common iliac artery.  His arteries were noncompressible and ABIs cannot be obtained. However his toe brachial index on the right is 0.21. His toe brachial index on the left is 0.35. These are relatively stable.  Bilateral lower extremity venous duplex scan: this shows no evidence of DVT in the right lower extremity or left lower extremity.  MEDICAL ISSUES:  Peripheral vascular disease, unspecified This patient has known multilevel arterial occlusive disease bilaterally. 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 I have recommended that we proceed with arteriography.I have reviewed with the patient the indications for arteriography. In addition, I have reviewed the potential complications of arteriography including but not limited to: Bleeding, arterial injury, arterial thrombosis, dye action, renal insufficiency, or other unpredictable medical problems. I have explained to the patient that if we find disease amenable to angioplasty we could potentially address this at the same time. I have discussed the potential complications of angioplasty and stenting, including but not limited to: Bleeding, arterial thrombosis, arterial injury, dissection, or the need for surgical intervention. I've explained that if we do the dressing any iliac disease with angioplasty and I do not think this will relieve his symptoms completely as there certainly a component of pain related to his degenerative disc disease of his back. He does have small calcified arteries and I've explained that this is associated with increased risk of complications. However, given the wound on the right foot I think this could quickly become a limb threatening situation. I will make further recommendations pending the results of his arteriogram.   BILATERAL LOWER EXTREMITY SWELLING: Venous duplex scan showed no evidence of DVT.   DICKSON,CHRISTOPHER S [facility] [place]: [phone]

## 2013-03-13 ENCOUNTER — Encounter (HOSPITAL_COMMUNITY)
Admission: RE | Disposition: A | Discharge status: Home / Self Care | Payer: Self-pay | Source: Ambulatory Visit | Attending: Vascular Surgery

## 2013-03-13 ENCOUNTER — Encounter (HOSPITAL_COMMUNITY): Payer: Self-pay | Admitting: Cardiology

## 2013-03-13 ENCOUNTER — Encounter (HOSPITAL_COMMUNITY): Payer: Self-pay | Admitting: Pharmacy Technician

## 2013-03-13 ENCOUNTER — Inpatient Hospital Stay (HOSPITAL_COMMUNITY)
Admission: RE | Admit: 2013-03-13 | Discharge: 2013-03-25 | DRG: 252 | Disposition: A | Discharge status: Home / Self Care | Payer: Medicare Other | Source: Ambulatory Visit | Attending: Internal Medicine | Admitting: Internal Medicine

## 2013-03-13 ENCOUNTER — Inpatient Hospital Stay (HOSPITAL_COMMUNITY): Payer: Medicare Other

## 2013-03-13 DIAGNOSIS — E1159 Type 2 diabetes mellitus with other circulatory complications: Secondary | ICD-10-CM

## 2013-03-13 DIAGNOSIS — D638 Anemia in other chronic diseases classified elsewhere: Secondary | ICD-10-CM | POA: Diagnosis present

## 2013-03-13 DIAGNOSIS — I4892 Unspecified atrial flutter: Secondary | ICD-10-CM

## 2013-03-13 DIAGNOSIS — K560 Paralytic ileus: Secondary | ICD-10-CM | POA: Diagnosis not present

## 2013-03-13 DIAGNOSIS — N179 Acute kidney failure, unspecified: Secondary | ICD-10-CM | POA: Diagnosis not present

## 2013-03-13 DIAGNOSIS — Z951 Presence of aortocoronary bypass graft: Secondary | ICD-10-CM

## 2013-03-13 DIAGNOSIS — R092 Respiratory arrest: Secondary | ICD-10-CM | POA: Diagnosis not present

## 2013-03-13 DIAGNOSIS — E871 Hypo-osmolality and hyponatremia: Secondary | ICD-10-CM | POA: Diagnosis not present

## 2013-03-13 DIAGNOSIS — I5023 Acute on chronic systolic (congestive) heart failure: Secondary | ICD-10-CM | POA: Diagnosis present

## 2013-03-13 DIAGNOSIS — I252 Old myocardial infarction: Secondary | ICD-10-CM

## 2013-03-13 DIAGNOSIS — J9600 Acute respiratory failure, unspecified whether with hypoxia or hypercapnia: Secondary | ICD-10-CM

## 2013-03-13 DIAGNOSIS — Z95810 Presence of automatic (implantable) cardiac defibrillator: Secondary | ICD-10-CM

## 2013-03-13 DIAGNOSIS — M069 Rheumatoid arthritis, unspecified: Secondary | ICD-10-CM | POA: Diagnosis present

## 2013-03-13 DIAGNOSIS — I2510 Atherosclerotic heart disease of native coronary artery without angina pectoris: Secondary | ICD-10-CM | POA: Diagnosis present

## 2013-03-13 DIAGNOSIS — Z794 Long term (current) use of insulin: Secondary | ICD-10-CM

## 2013-03-13 DIAGNOSIS — Z7901 Long term (current) use of anticoagulants: Secondary | ICD-10-CM

## 2013-03-13 DIAGNOSIS — K219 Gastro-esophageal reflux disease without esophagitis: Secondary | ICD-10-CM | POA: Diagnosis present

## 2013-03-13 DIAGNOSIS — I739 Peripheral vascular disease, unspecified: Secondary | ICD-10-CM | POA: Diagnosis present

## 2013-03-13 DIAGNOSIS — Z87891 Personal history of nicotine dependence: Secondary | ICD-10-CM

## 2013-03-13 DIAGNOSIS — IMO0001 Reserved for inherently not codable concepts without codable children: Secondary | ICD-10-CM | POA: Diagnosis present

## 2013-03-13 DIAGNOSIS — E1151 Type 2 diabetes mellitus with diabetic peripheral angiopathy without gangrene: Secondary | ICD-10-CM | POA: Diagnosis present

## 2013-03-13 DIAGNOSIS — I059 Rheumatic mitral valve disease, unspecified: Secondary | ICD-10-CM

## 2013-03-13 DIAGNOSIS — I745 Embolism and thrombosis of iliac artery: Secondary | ICD-10-CM | POA: Diagnosis present

## 2013-03-13 DIAGNOSIS — R001 Bradycardia, unspecified: Secondary | ICD-10-CM

## 2013-03-13 DIAGNOSIS — I509 Heart failure, unspecified: Secondary | ICD-10-CM | POA: Diagnosis present

## 2013-03-13 DIAGNOSIS — Z9911 Dependence on respirator [ventilator] status: Secondary | ICD-10-CM

## 2013-03-13 DIAGNOSIS — G8929 Other chronic pain: Secondary | ICD-10-CM | POA: Diagnosis present

## 2013-03-13 DIAGNOSIS — I129 Hypertensive chronic kidney disease with stage 1 through stage 4 chronic kidney disease, or unspecified chronic kidney disease: Secondary | ICD-10-CM | POA: Diagnosis present

## 2013-03-13 DIAGNOSIS — E785 Hyperlipidemia, unspecified: Secondary | ICD-10-CM | POA: Diagnosis present

## 2013-03-13 DIAGNOSIS — L97309 Non-pressure chronic ulcer of unspecified ankle with unspecified severity: Secondary | ICD-10-CM | POA: Diagnosis present

## 2013-03-13 DIAGNOSIS — E875 Hyperkalemia: Secondary | ICD-10-CM | POA: Diagnosis not present

## 2013-03-13 DIAGNOSIS — M549 Dorsalgia, unspecified: Secondary | ICD-10-CM | POA: Diagnosis present

## 2013-03-13 DIAGNOSIS — I959 Hypotension, unspecified: Secondary | ICD-10-CM | POA: Diagnosis not present

## 2013-03-13 DIAGNOSIS — Z7982 Long term (current) use of aspirin: Secondary | ICD-10-CM

## 2013-03-13 DIAGNOSIS — I4891 Unspecified atrial fibrillation: Principal | ICD-10-CM | POA: Diagnosis present

## 2013-03-13 DIAGNOSIS — I498 Other specified cardiac arrhythmias: Secondary | ICD-10-CM | POA: Diagnosis present

## 2013-03-13 DIAGNOSIS — IMO0002 Reserved for concepts with insufficient information to code with codable children: Secondary | ICD-10-CM

## 2013-03-13 DIAGNOSIS — I998 Other disorder of circulatory system: Secondary | ICD-10-CM | POA: Diagnosis present

## 2013-03-13 DIAGNOSIS — E876 Hypokalemia: Secondary | ICD-10-CM | POA: Diagnosis not present

## 2013-03-13 DIAGNOSIS — I743 Embolism and thrombosis of arteries of the lower extremities: Secondary | ICD-10-CM | POA: Diagnosis present

## 2013-03-13 DIAGNOSIS — R579 Shock, unspecified: Secondary | ICD-10-CM

## 2013-03-13 DIAGNOSIS — Z79899 Other long term (current) drug therapy: Secondary | ICD-10-CM

## 2013-03-13 DIAGNOSIS — I255 Ischemic cardiomyopathy: Secondary | ICD-10-CM | POA: Diagnosis present

## 2013-03-13 DIAGNOSIS — I2589 Other forms of chronic ischemic heart disease: Secondary | ICD-10-CM | POA: Diagnosis present

## 2013-03-13 DIAGNOSIS — N189 Chronic kidney disease, unspecified: Secondary | ICD-10-CM | POA: Diagnosis present

## 2013-03-13 HISTORY — DX: Presence of automatic (implantable) cardiac defibrillator: Z95.810

## 2013-03-13 HISTORY — DX: Rheumatoid arthritis, unspecified: M06.9

## 2013-03-13 HISTORY — PX: ABDOMINAL AORTAGRAM: SHX5454

## 2013-03-13 HISTORY — DX: Pressure ulcer of right ankle, unspecified stage: L89.519

## 2013-03-13 HISTORY — DX: Peripheral vascular disease, unspecified: I73.9

## 2013-03-13 LAB — COMPREHENSIVE METABOLIC PANEL
ALT: 16 U/L (ref 0–53)
AST: 8 U/L (ref 0–37)
Albumin: 3.2 g/dL — ABNORMAL LOW (ref 3.5–5.2)
Alkaline Phosphatase: 67 U/L (ref 39–117)
CO2: 26 mEq/L (ref 19–32)
Calcium: 9.8 mg/dL (ref 8.4–10.5)
Creatinine, Ser: 0.68 mg/dL (ref 0.50–1.35)
GFR calc Af Amer: >90 mL/min (ref >90)
GFR calc non Af Amer: >90 mL/min (ref >90)
Glucose, Bld: 203 mg/dL — ABNORMAL HIGH (ref 70–99)
Potassium: 3.6 mEq/L (ref 3.5–5.1)
Sodium: 139 mEq/L (ref 135–145)
Total Protein: 6.4 g/dL (ref 6.0–8.3)

## 2013-03-13 LAB — PROTIME-INR
INR: 1.06 (ref 0.00–1.49)
Prothrombin Time: 13.6 seconds (ref 11.6–15.2)

## 2013-03-13 LAB — MRSA PCR SCREENING: MRSA by PCR: NEGATIVE

## 2013-03-13 LAB — POCT I-STAT, CHEM 8
BUN: 15 mg/dL (ref 6–23)
Calcium, Ion: 1.1 mmol/L — ABNORMAL LOW (ref 1.13–1.30)
Creatinine, Ser: 0.9 mg/dL (ref 0.50–1.35)
Hemoglobin: 12.2 g/dL — ABNORMAL LOW (ref 13.0–17.0)
TCO2: 23 mmol/L (ref 0–100)

## 2013-03-13 LAB — CBC
MCH: 26.2 pg (ref 26.0–34.0)
MCHC: 30.8 g/dL (ref 30.0–36.0)
Platelets: 186 10*3/uL (ref 150–400)
RDW: 16.7 % — ABNORMAL HIGH (ref 11.5–15.5)

## 2013-03-13 LAB — GLUCOSE, CAPILLARY
Glucose-Capillary: 239 mg/dL — ABNORMAL HIGH (ref 70–99)
Glucose-Capillary: 178 mg/dL — ABNORMAL HIGH (ref 70–99)
Glucose-Capillary: 293 mg/dL — ABNORMAL HIGH (ref 70–99)
Glucose-Capillary: 336 mg/dL — ABNORMAL HIGH (ref 70–99)

## 2013-03-13 LAB — TSH: TSH: 0.744 u[IU]/mL (ref 0.350–4.500)

## 2013-03-13 LAB — HEMOGLOBIN A1C
Hgb A1c MFr Bld: 9 % — ABNORMAL HIGH (ref <5.7)
Mean Plasma Glucose: 212 mg/dL — ABNORMAL HIGH (ref <117)

## 2013-03-13 LAB — APTT: aPTT: 24 seconds (ref 24–37)

## 2013-03-13 SURGERY — ABDOMINAL AORTAGRAM
Anesthesia: LOCAL

## 2013-03-13 MED ORDER — ACETAMINOPHEN 325 MG PO TABS: 650.0000 mg | ORAL_TABLET | ORAL | Status: DC | PRN

## 2013-03-13 MED ORDER — PREDNISONE 5 MG PO TABS
5.0000 mg | ORAL_TABLET | Freq: Two times a day (BID) | ORAL | Status: DC
  Administered 2013-03-13 – 2013-03-14 (×3): 5 mg via ORAL
  Filled 2013-03-13 (×6): qty 1

## 2013-03-13 MED ORDER — GABAPENTIN 600 MG PO TABS
600.0000 mg | ORAL_TABLET | Freq: Two times a day (BID) | ORAL | Status: DC
  Filled 2013-03-13: qty 2

## 2013-03-13 MED ORDER — ASPIRIN EC 81 MG PO TBEC
81.0000 mg | DELAYED_RELEASE_TABLET | Freq: Every day | ORAL | Status: DC
  Administered 2013-03-14 – 2013-03-16 (×2): 81 mg via ORAL
  Filled 2013-03-13 (×4): qty 1

## 2013-03-13 MED ORDER — HYDROCODONE-ACETAMINOPHEN 10-325 MG PO TABS
1.0000 | ORAL_TABLET | Freq: Every day | ORAL | Status: DC | PRN
  Administered 2013-03-13 – 2013-03-14 (×2): 1 via ORAL
  Filled 2013-03-13 (×2): qty 1

## 2013-03-13 MED ORDER — SODIUM CHLORIDE 0.9 % IJ SOLN
3.0000 mL | Freq: Two times a day (BID) | INTRAMUSCULAR | Status: DC
  Administered 2013-03-13 – 2013-03-14 (×2): via INTRAVENOUS
  Administered 2013-03-15 – 2013-03-23 (×12): 3 mL via INTRAVENOUS

## 2013-03-13 MED ORDER — CLOPIDOGREL BISULFATE 75 MG PO TABS
75.0000 mg | ORAL_TABLET | Freq: Every day | ORAL | Status: DC
  Administered 2013-03-14: 75 mg via ORAL
  Filled 2013-03-13 (×3): qty 1

## 2013-03-13 MED ORDER — OMEGA-3 FATTY ACIDS 1000 MG PO CAPS: 2.0000 g | ORAL_CAPSULE | Freq: Every day | ORAL | Status: DC

## 2013-03-13 MED ORDER — IRBESARTAN 75 MG PO TABS
75.0000 mg | ORAL_TABLET | Freq: Every day | ORAL | Status: DC
  Administered 2013-03-14: 75 mg via ORAL
  Filled 2013-03-13 (×2): qty 1

## 2013-03-13 MED ORDER — SODIUM CHLORIDE 0.9 % IJ SOLN: 3.0000 mL | INTRAMUSCULAR | Status: DC | PRN

## 2013-03-13 MED ORDER — LINAGLIPTIN 5 MG PO TABS
5.0000 mg | ORAL_TABLET | Freq: Every day | ORAL | Status: DC
  Administered 2013-03-13 – 2013-03-16 (×4): 5 mg via ORAL
  Filled 2013-03-13 (×5): qty 1

## 2013-03-13 MED ORDER — ONDANSETRON HCL 4 MG/2ML IJ SOLN
4.0000 mg | Freq: Four times a day (QID) | INTRAMUSCULAR | Status: DC | PRN
  Administered 2013-03-14 – 2013-03-20 (×5): 4 mg via INTRAVENOUS
  Filled 2013-03-13 (×5): qty 2

## 2013-03-13 MED ORDER — INSULIN ASPART 100 UNIT/ML ~~LOC~~ SOLN
0.0000 [IU] | Freq: Every day | SUBCUTANEOUS | Status: DC
Start: 2013-03-13 — End: 2013-03-15
  Administered 2013-03-13: 4 [IU] via SUBCUTANEOUS

## 2013-03-13 MED ORDER — CALCIUM CARBONATE-VITAMIN D 500-200 MG-UNIT PO TABS
2.0000 | ORAL_TABLET | Freq: Every day | ORAL | Status: DC
  Administered 2013-03-14 – 2013-03-21 (×7): 2 via ORAL
  Filled 2013-03-13 (×10): qty 2

## 2013-03-13 MED ORDER — GABAPENTIN 400 MG PO CAPS
1200.0000 mg | ORAL_CAPSULE | Freq: Every day | ORAL | Status: DC
  Administered 2013-03-13 – 2013-03-17 (×4): 1200 mg via ORAL
  Filled 2013-03-13 (×7): qty 3

## 2013-03-13 MED ORDER — SODIUM CHLORIDE 0.9 % IV SOLN
INTRAVENOUS | Status: DC
  Administered 2013-03-13: 07:00:00 via INTRAVENOUS

## 2013-03-13 MED ORDER — HYDROCHLOROTHIAZIDE 12.5 MG PO CAPS: 12.5000 mg | ORAL_CAPSULE | Freq: Every day | ORAL | Status: DC

## 2013-03-13 MED ORDER — FUROSEMIDE 10 MG/ML IJ SOLN
40.0000 mg | Freq: Two times a day (BID) | INTRAMUSCULAR | Status: DC
  Administered 2013-03-13 – 2013-03-14 (×3): 40 mg via INTRAVENOUS
  Filled 2013-03-13 (×4): qty 4

## 2013-03-13 MED ORDER — FERROUS GLUCONATE 324 (38 FE) MG PO TABS
325.0000 mg | ORAL_TABLET | Freq: Every day | ORAL | Status: DC
  Filled 2013-03-13 (×2): qty 1

## 2013-03-13 MED ORDER — HYDRALAZINE HCL 25 MG PO TABS
25.0000 mg | ORAL_TABLET | Freq: Two times a day (BID) | ORAL | Status: DC
  Administered 2013-03-13 – 2013-03-14 (×2): 25 mg via ORAL
  Filled 2013-03-13 (×7): qty 1

## 2013-03-13 MED ORDER — CLOBETASOL PROPIONATE 0.05 % EX CREA
1.0000 "application " | TOPICAL_CREAM | Freq: Two times a day (BID) | CUTANEOUS | Status: DC
  Administered 2013-03-13 – 2013-03-25 (×21): 1 via TOPICAL
  Filled 2013-03-13 (×3): qty 15

## 2013-03-13 MED ORDER — INSULIN ASPART 100 UNIT/ML ~~LOC~~ SOLN
0.0000 [IU] | Freq: Three times a day (TID) | SUBCUTANEOUS | Status: DC
Start: 2013-03-14 — End: 2013-03-15
  Administered 2013-03-14: 3 [IU] via SUBCUTANEOUS
  Administered 2013-03-14 (×2): 5 [IU] via SUBCUTANEOUS

## 2013-03-13 MED ORDER — POTASSIUM CHLORIDE CRYS ER 20 MEQ PO TBCR
40.0000 meq | EXTENDED_RELEASE_TABLET | Freq: Every day | ORAL | Status: DC
  Administered 2013-03-13 – 2013-03-16 (×3): 40 meq via ORAL
  Filled 2013-03-13 (×3): qty 2

## 2013-03-13 MED ORDER — CARVEDILOL 25 MG PO TABS
25.0000 mg | ORAL_TABLET | Freq: Two times a day (BID) | ORAL | Status: DC
  Administered 2013-03-13 – 2013-03-14 (×2): 25 mg via ORAL
  Filled 2013-03-13 (×4): qty 1

## 2013-03-13 MED ORDER — LEFLUNOMIDE 20 MG PO TABS
20.0000 mg | ORAL_TABLET | Freq: Every day | ORAL | Status: DC
  Administered 2013-03-14 – 2013-03-18 (×4): 20 mg via ORAL
  Filled 2013-03-13 (×5): qty 1

## 2013-03-13 MED ORDER — ISOSORBIDE MONONITRATE ER 60 MG PO TB24
60.0000 mg | ORAL_TABLET | Freq: Every day | ORAL | Status: DC
  Administered 2013-03-14: 60 mg via ORAL
  Filled 2013-03-13 (×3): qty 1

## 2013-03-13 MED ORDER — ALPRAZOLAM 0.25 MG PO TABS
0.2500 mg | ORAL_TABLET | Freq: Two times a day (BID) | ORAL | Status: DC | PRN
  Filled 2013-03-13: qty 1

## 2013-03-13 MED ORDER — GABAPENTIN 300 MG PO CAPS
600.0000 mg | ORAL_CAPSULE | Freq: Every day | ORAL | Status: DC
  Administered 2013-03-14 – 2013-03-18 (×4): 600 mg via ORAL
  Filled 2013-03-13 (×5): qty 2

## 2013-03-13 MED ORDER — HEPARIN (PORCINE) IN NACL 100-0.45 UNIT/ML-% IJ SOLN
1250.0000 [IU]/kg/h | INTRAMUSCULAR | Status: DC
  Administered 2013-03-13: 1250 [IU]/kg/h via INTRAVENOUS
  Filled 2013-03-13 (×37): qty 250

## 2013-03-13 MED ORDER — NITROGLYCERIN 0.4 MG SL SUBL: 0.4000 mg | SUBLINGUAL_TABLET | SUBLINGUAL | Status: DC | PRN

## 2013-03-13 MED ORDER — CALCIUM CARB-CHOLECALCIFEROL 1000-800 MG-UNIT PO TABS
1.0000 | ORAL_TABLET | Freq: Every day | ORAL | Status: DC
Start: 2013-03-13 — End: 2013-03-13

## 2013-03-13 MED ORDER — HEPARIN (PORCINE) IN NACL 100-0.45 UNIT/ML-% IJ SOLN
1550.0000 [IU]/h | INTRAMUSCULAR | Status: DC
  Administered 2013-03-14 (×2): 1550 [IU]/h via INTRAVENOUS
  Filled 2013-03-13 (×4): qty 250

## 2013-03-13 MED ORDER — OMEGA-3-ACID ETHYL ESTERS 1 G PO CAPS
2.0000 g | ORAL_CAPSULE | Freq: Every day | ORAL | Status: DC
  Administered 2013-03-14 – 2013-03-18 (×4): 2 g via ORAL
  Filled 2013-03-13 (×5): qty 2

## 2013-03-13 MED ORDER — ATORVASTATIN CALCIUM 10 MG PO TABS
10.0000 mg | ORAL_TABLET | Freq: Every day | ORAL | Status: DC
  Administered 2013-03-13 – 2013-03-14 (×2): 10 mg via ORAL
  Filled 2013-03-13 (×3): qty 1

## 2013-03-13 MED ORDER — ZOLPIDEM TARTRATE 5 MG PO TABS: 5.0000 mg | ORAL_TABLET | Freq: Every evening | ORAL | Status: DC | PRN

## 2013-03-13 MED ORDER — PANTOPRAZOLE SODIUM 40 MG PO TBEC
80.0000 mg | DELAYED_RELEASE_TABLET | Freq: Two times a day (BID) | ORAL | Status: DC
  Administered 2013-03-13 – 2013-03-14 (×3): 80 mg via ORAL
  Filled 2013-03-13 (×3): qty 2

## 2013-03-13 MED ORDER — DILTIAZEM LOAD VIA INFUSION
10.0000 mg | Freq: Once | INTRAVENOUS | Status: AC
Start: 2013-03-13 — End: 2013-03-13
  Filled 2013-03-13: qty 10

## 2013-03-13 MED ORDER — EZETIMIBE 10 MG PO TABS
10.0000 mg | ORAL_TABLET | Freq: Every day | ORAL | Status: DC
  Administered 2013-03-13 – 2013-03-14 (×2): 10 mg via ORAL
  Filled 2013-03-13 (×3): qty 1

## 2013-03-13 MED ORDER — SODIUM CHLORIDE 0.9 % IV SOLN: 250.0000 mL | INTRAVENOUS | Status: DC | PRN

## 2013-03-13 MED ORDER — HEPARIN BOLUS VIA INFUSION
4000.0000 [IU] | Freq: Once | INTRAVENOUS | Status: AC
  Administered 2013-03-13: 4000 [IU] via INTRAVENOUS
  Filled 2013-03-13: qty 4000

## 2013-03-13 MED ORDER — HYDROCODONE-ACETAMINOPHEN 5-325 MG PO TABS: 1.0000 | ORAL_TABLET | Freq: Four times a day (QID) | ORAL | Status: DC | PRN

## 2013-03-13 MED ORDER — VALSARTAN-HYDROCHLOROTHIAZIDE 80-12.5 MG PO TABS: 1.0000 | ORAL_TABLET | Freq: Every day | ORAL | Status: DC

## 2013-03-13 MED ORDER — DILTIAZEM HCL 100 MG IV SOLR
5.0000 mg/h | INTRAVENOUS | Status: DC
  Administered 2013-03-13 (×2): 10 mg/h via INTRAVENOUS
  Administered 2013-03-13 – 2013-03-14 (×2): 5 mg/h via INTRAVENOUS
  Filled 2013-03-13: qty 100

## 2013-03-13 NOTE — Progress Notes (Signed)
ANTICOAGULATION CONSULT NOTE - Initial Consult  Pharmacy Consult for heparin Indication: atrial fibrillation  Allergies  Allergen Reactions  . Lodine [Etodolac] Nausea And Vomiting  . Methotrexate Derivatives Nausea And Vomiting  . Ranitidine Hcl Nausea Only  . Remicade [Infliximab] Other (See Comments)    Chills and shakes     Patient Measurements: Height: 5\' 9"  (175.3 cm) Weight: 216 lb (97.977 kg) IBW/kg (Calculated) : 70.7 Heparin Dosing Weight: 82 kg  Vital Signs: Temp: 97.6 F (36.4 C) (12/15 1346) Temp src: Oral (12/15 1346) BP: 122/73 mmHg (12/15 0701) Pulse Rate: 136 (12/15 0701)  Labs:  Recent Labs  03/13/13 0718 03/13/13 1110  HGB 12.2*  --   HCT 36.0*  --   APTT  --  24  LABPROT  --  13.6  INR  --  1.06  CREATININE 0.90 0.68    Estimated Creatinine Clearance: 113.3 ml/min (by C-G formula based on Cr of 0.68).   Medical History: Past Medical History  Diagnosis Date  . Diabetes mellitus   . Hypertension   . Hyperlipidemia   . Rheumatoid arthritis   . CAD (coronary artery disease)   . Myocardial infarction 2000 & 2006  . GERD (gastroesophageal reflux disease)   . Leg pain   . CHF (congestive heart failure)   . Chronic kidney disease   . PAD (peripheral artery disease)   . Decubitus ulcer of right ankle   . 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 Pacemaker     Medications:  Prescriptions prior to admission  Medication Sig Dispense Refill  . Abatacept (ORENCIA [place]) Inject into the skin once a week.      Marland Kitchen aspirin EC 81 MG tablet Take 81 mg by mouth daily.        . Calcium Carb-Cholecalciferol (CALCIUM 1000 + D PO) Take 1 tablet by mouth daily.        . calcium carbonate (TUMS - DOSED IN MG ELEMENTAL CALCIUM) 500 MG chewable tablet Chew 1 tablet by mouth as needed for indigestion.       . carvedilol (COREG) 25 MG tablet Take 1 tablet by mouth 2 (two) times daily.      . clobetasol cream (TEMOVATE) 0.05 % Apply 1  application topically 2 (two) times daily.      . clopidogrel (PLAVIX) 75 MG tablet Take 75 mg by mouth daily.        Marland Kitchen ezetimibe (ZETIA) 10 MG tablet Take 10 mg by mouth daily.        . ferrous gluconate (FERGON) 325 MG tablet Take 325 mg by mouth daily with breakfast.        . fish oil-omega-3 fatty acids 1000 MG capsule Take 2 g by mouth daily.        Marland Kitchen gabapentin (NEURONTIN) 600 MG tablet Take 1-2 tablets by mouth 2 (two) times daily. 600 mg in am and 1200 mg at night.      . glyBURIDE-metformin (GLUCOVANCE) 5-500 MG per tablet Take 1-2 tablets by mouth 2 (two) times daily with a meal. Takes 2 in the am and 1 tab in the pm.      . hydrALAZINE (APRESOLINE) 25 MG tablet Take 1 tablet by mouth 2 (two) times daily.      Marland Kitchen HYDROcodone-acetaminophen ([place]) 10-325 MG per tablet Take 1 tablet by mouth 5 (five) times daily.      . Ibuprofen (ADVIL PO) Take 600 mg elemental calcium/kg/hr by mouth 2 (two) times daily.       Marland Kitchen  isosorbide mononitrate (IMDUR) 60 MG 24 hr tablet Take 1 tablet by mouth daily.       Marland Kitchen leflunomide (ARAVA) 20 MG tablet Take 1 tablet by mouth daily.      . pantoprazole (PROTONIX) 40 MG tablet Take 80 mg by mouth 2 (two) times daily.       . predniSONE (DELTASONE) 5 MG tablet Take 5 mg by mouth 2 (two) times daily.       . sitaGLIPtin (JANUVIA) 100 MG tablet Take 100 mg by mouth daily.        . valsartan-hydrochlorothiazide (DIOVAN-HCT) 80-12.5 MG per tablet Take 1 tablet by mouth daily.        Assessment: 62 yo M to start heparin for afib with RVR.   Patient presented today for a peripheral arteriogram. He was found to be in atrial fibrillation with a rapid ventricular response.  Arteriogram postponed until rate controlled. Wt 98 kg.   Baseline aPTT 24 sec, baseline INR 1.06, H/H 12.2/36.     Goal of Therapy:  Heparin level 0.3-0.7 units/ml Monitor platelets by anticoagulation protocol: Yes   Plan:  Give 4000 units bolus x 1 Start heparin infusion at 1250  units/hr Check anti-Xa level in 6 hours and daily while on heparin Continue to monitor H&H and platelets F/u for long-term anticoag plans after his ateriogram for PAD. Herby Abraham, Pharm.D. [phone] 03/13/2013 2:25 PM

## 2013-03-13 NOTE — Progress Notes (Signed)
  Echocardiogram 2D Echocardiogram has been performed.  [place] 03/13/2013, 6:07 PM

## 2013-03-13 NOTE — H&P (Signed)
Physician History and Physical    Patient ID: [name] [name] MRN: [id number] DOB/AGE: 1950-05-07 62 y.o. Admit date: 03/13/2013  Primary Care Physician: Gaspar Skeeters MD Primary Cardiologist: Phillip Heal MD- [place], [place]  HPI:  [name] [name] is seen at the request of Dr. Edilia Bo for evaluation and treatment of atrial fibrillation with a rapid ventricular response. Patient is a 62 year old white male with extensive cardiac history. His primary cardiologist is Dr. Nedra Hai in [place]. Patient has a history of myocardial infarction in 2000. He presented with acute coronary syndrome in 2006 and subsequently underwent coronary bypass surgery x5 in [facility]. He has a history of congestive heart failure and is status post ICD in January 2014. He denies any prior history of atrial fibrillation. Patient reports over the past 2 weeks he has had some upper respiratory symptoms including cough productive of phlegm. He complains of increased chest tightness particularly in the early morning that seems to resolve once he has coughed up some phlegm. He does complain of increasing edema, weight gain, and increased abdominal girth. He takes Lasix on a when necessary basis. He took one dose at the end of last week but did not get a good response. Patient presented today for a peripheral arteriogram. He was found to be in atrial fibrillation with a rapid ventricular response. He was also noted on his office visit on December 10 and his pulse rate was 136 beats per minute. Patient is unaware of any palpitations or chest pain other than as noted above.  Patient does have a significant history of per arterial disease. He's had previous stenting of the left external iliac artery in 2011. He also has SFA occlusion on the left. He presents with resting claudication involving his right leg and a nonhealing ulcer on the right ankle. He has been on chronic aspirin and Plavix.  Additional medical  problems include diabetes mellitus, hypertension, hyperlipidemia, congestive heart failure, chronic kidney disease, and rheumatoid arthritis. He also has chronic back pain and is being evaluated for possible back surgery.  Review of systems complete and found to be negative unless listed above  Past Medical History  Diagnosis Date  . Diabetes mellitus   . Hypertension   . Hyperlipidemia   . Arthritis   . CAD (coronary artery disease)   . Myocardial infarction 2000 & 2006  . GERD (gastroesophageal reflux disease)   . Leg pain   . CHF (congestive heart failure)   . Chronic kidney disease     Allergies: Methotrexate, Lodine, Zantac, Remicade   Family History  Problem Relation Age of Onset  . Hyperlipidemia Mother   . Diabetes Father   . Hypertension Father   . Heart disease Father   . Heart attack Father     History   Social History  . Marital Status: Married    Spouse Name: N/A    Number of Children: N/A  . Years of Education: N/A   Occupational History  . Not on file.   Social History Main Topics  . Smoking status: Former Smoker    Types: Cigarettes    Quit date: 03/30/1997  . Smokeless tobacco: Not on file  . Alcohol Use: 0.6 oz/week    1 Glasses of wine per week  . Drug Use: No  . Sexual Activity: Not on file   Other Topics Concern  . Not on file   Social History Narrative  . No narrative on file    Past Surgical History  Procedure Laterality  Date  . Hernia repair    . Coronary artery bypass graft    . Carpal tunnel release    . Knee surgery    . Coronary stent placement    . Pacemaker placement    . Difibulator       Prescriptions prior to admission  Medication Sig Dispense Refill  . Abatacept (ORENCIA [place]) Inject into the skin once a week.      Marland Kitchen aspirin EC 81 MG tablet Take 81 mg by mouth daily.        . Calcium Carb-Cholecalciferol (CALCIUM 1000 + D PO) Take 1 tablet by mouth daily.        . calcium carbonate (TUMS - DOSED IN MG ELEMENTAL  CALCIUM) 500 MG chewable tablet Chew 1 tablet by mouth as needed for indigestion.       . carvedilol (COREG) 25 MG tablet Take 1 tablet by mouth 2 (two) times daily.      . Cholecalciferol (VITAMIN D-3 PO) Take 1 tablet by mouth daily.      . clopidogrel (PLAVIX) 75 MG tablet Take 75 mg by mouth daily.        Marland Kitchen ezetimibe (ZETIA) 10 MG tablet Take 10 mg by mouth daily.        . ferrous gluconate (FERGON) 325 MG tablet Take 325 mg by mouth daily with breakfast.        . fish oil-omega-3 fatty acids 1000 MG capsule Take 2 g by mouth daily.        Marland Kitchen gabapentin (NEURONTIN) 600 MG tablet Take 1-2 tablets by mouth 2 (two) times daily. 600 mg in am and 1200 mg at night.      . glyBURIDE-metformin (GLUCOVANCE) 5-500 MG per tablet Take 1-2 tablets by mouth 2 (two) times daily with a meal. Takes 2 in the am and 1 tab in the pm.      . hydrALAZINE (APRESOLINE) 25 MG tablet Take 1 tablet by mouth 2 (two) times daily.      Marland Kitchen HYDROcodone-acetaminophen (NORCO) 10-325 MG per tablet Take 1 tablet by mouth 5 (five) times daily.      . Ibuprofen (ADVIL PO) Take 600 mg elemental calcium/kg/hr by mouth 2 (two) times daily.       . isosorbide mononitrate (IMDUR) 60 MG 24 hr tablet Take 1 tablet by mouth daily.       Marland Kitchen leflunomide (ARAVA) 20 MG tablet Take 1 tablet by mouth daily.              . pantoprazole (PROTONIX) 40 MG tablet Take 80 mg by mouth 2 (two) times daily.       . predniSONE (DELTASONE) 5 MG tablet Take 5 mg by mouth 2 (two) times daily.       . sitaGLIPtin (JANUVIA) 100 MG tablet Take 100 mg by mouth daily.        . valsartan-hydrochlorothiazide (DIOVAN-HCT) 80-12.5 MG per tablet Take 1 tablet by mouth daily.        Physical Exam: Blood pressure 122/73, pulse 136, temperature 97.5 F (36.4 C), temperature source Oral, resp. rate 20, height 5\' 9"  (1.753 m), weight 216 lb (97.977 kg), SpO2 97.00%.  The patient is a pleasant white male in no acute distress. HEENT: Nose, atraumatic. Pupils equal round  reactive to light accommodation. Sclera clear. Oropharynx is clear. Dentition is in good repair. Neck: Supple without carotid bruits, adenopathy, or thyromegaly. He has JVD to 8 cm. Lungs: Mildly diminished breath sounds in the  bases. Cardiac: Irregular rate and rhythm. Tachycardic. No gallop or murmur. Abdomen: Obese, soft and distended. Bowel sounds are positive. No obvious fluid wave. No hepatosplenomegaly. No masses. Extremities: Pedal pulses are nonpalpable. He has an ulcer on the lateral aspect of his right ankle. There is 2+ bilateral edema. Neuro: Alert and oriented x3. Cranial nerves II through XII are intact. No focal findings. Mood: Appropriate Skin: Warm and dry  Labs:   Lab Results  Component Value Date   HGB 12.2* 03/13/2013   HCT 36.0* 03/13/2013     Recent Labs Lab 03/13/13 0718  NA 138  K 3.3*  CL 101  BUN 15  CREATININE 0.90  GLUCOSE 262*   No results found for this basename: CKTOTAL,  CKMB,  CKMBINDEX,  TROPONINI    No results found for this basename: CHOL   No results found for this basename: HDL   No results found for this basename: LDLCALC   No results found for this basename: TRIG   No results found for this basename: CHOLHDL   No results found for this basename: LDLDIRECT      Radiology: Pending  EKG: Atrial fibrillation with a rapid ventricular response. Rate is 118 beats per minute. He has a right bundle branch block.  ASSESSMENT AND PLAN:  1. Atrial fibrillation with a rapid ventricular response. Duration is unknown but this has been present for at least 5 days. I suspect it has been present for at least 2 weeks. We will interrogate his ICD to evaluate. I recommended admission for IV diltiazem for rate control. We will continue his current dose of carvedilol. He will be heparinized with IV heparin. Patient has a [place] Vasc score of 4 and should be on long term anticoagulation. Will await oral anticoagulation pending his arteriogram and  decision concerning treatment of his PAD. We will check a TSH. 2. Acute on chronic systolic CHF. This is probably exacerbated by his atrial fibrillation with rapid ventricular response. He has significant weight gain, edema, and increased abdominal girth. We will check a chest x-ray. We will continue with carvedilol and ARB therapy. He'll be diuresed with IV Lasix. We'll check BNP level and echocardiogram. 3. PAD with resting claudication and nonhealing ulcer of the right ankle. I anticipate we will be able to proceed with arteriogram once we have controlled his heart rate and tuned up his congestive heart failure. Once arteriogram is complete we can make plans for long-term anticoagulation and/or TEE/DC cardioversion. For now we'll continue aspirin and Plavix. 4. Coronary disease with remote myocardial infarction. Status post CABG in 2006. 5. Chronic kidney disease. Patient's creatinine is 0.9. Because of his congestive heart failure and need for anticoagulation he should avoid nonsteroidal anti-inflammatory drugs. 6. Status post prophylactic ICD. Will interrogate device this admission. 7. Diabetes mellitus type 2. We'll hold metformin during his hospitalization. Continue Januvia and glyburide. Cover with sliding scale insulin. 8. Hypertension 9. Rheumatoid arthritis. 10. GERD. Continue Protonix therapy. 11. Hyperlipidemia. Will check fasting lipid panel. With his extensive vascular history he should be on statin therapy. Will start lipitor 10 mg daily.  SignedTheron Arista [facility] 03/13/2013, 10:26 AM

## 2013-03-13 NOTE — Plan of Care (Signed)
Problem: Phase I Progression Outcomes Goal: Anticoagulation Therapy per MD order Outcome: Progressing Heparin gtt ordered per pharmacy protocol

## 2013-03-14 ENCOUNTER — Encounter (HOSPITAL_COMMUNITY): Payer: Self-pay | Admitting: Physician Assistant

## 2013-03-14 ENCOUNTER — Inpatient Hospital Stay (HOSPITAL_COMMUNITY): Payer: Medicare Other | Admitting: Anesthesiology

## 2013-03-14 ENCOUNTER — Encounter (HOSPITAL_COMMUNITY)
Admission: RE | Disposition: A | Discharge status: Home / Self Care | Payer: Self-pay | Source: Ambulatory Visit | Attending: Vascular Surgery

## 2013-03-14 ENCOUNTER — Encounter (HOSPITAL_COMMUNITY): Payer: Medicare Other | Admitting: Anesthesiology

## 2013-03-14 DIAGNOSIS — I498 Other specified cardiac arrhythmias: Secondary | ICD-10-CM

## 2013-03-14 DIAGNOSIS — R001 Bradycardia, unspecified: Secondary | ICD-10-CM | POA: Diagnosis not present

## 2013-03-14 DIAGNOSIS — Z951 Presence of aortocoronary bypass graft: Secondary | ICD-10-CM

## 2013-03-14 DIAGNOSIS — I959 Hypotension, unspecified: Secondary | ICD-10-CM | POA: Diagnosis not present

## 2013-03-14 HISTORY — PX: INSERTION OF ILIAC STENT: SHX6256

## 2013-03-14 HISTORY — PX: ABDOMINAL AORTAGRAM: SHX5454

## 2013-03-14 HISTORY — PX: PATCH ANGIOPLASTY: SHX6230

## 2013-03-14 LAB — POCT I-STAT 7, (LYTES, BLD GAS, ICA,H+H)
Acid-Base Excess: 2 mmol/L (ref 0.0–2.0)
Bicarbonate: 29.6 mEq/L — ABNORMAL HIGH (ref 20.0–24.0)
Calcium, Ion: 1.01 mmol/L — ABNORMAL LOW (ref 1.13–1.30)
HCT: 35 % — ABNORMAL LOW (ref 39.0–52.0)
Hemoglobin: 11.9 g/dL — ABNORMAL LOW (ref 13.0–17.0)
Potassium: 5 mEq/L (ref 3.5–5.1)
Sodium: 134 mEq/L — ABNORMAL LOW (ref 135–145)
TCO2: 31 mmol/L (ref 0–100)
pCO2 arterial: 55.7 mmHg — ABNORMAL HIGH (ref 35.0–45.0)
pH, Arterial: 7.331 — ABNORMAL LOW (ref 7.350–7.450)

## 2013-03-14 LAB — GLUCOSE, CAPILLARY
Glucose-Capillary: 202 mg/dL — ABNORMAL HIGH (ref 70–99)
Glucose-Capillary: 268 mg/dL — ABNORMAL HIGH (ref 70–99)
Glucose-Capillary: 380 mg/dL — ABNORMAL HIGH (ref 70–99)

## 2013-03-14 LAB — BASIC METABOLIC PANEL
BUN: 12 mg/dL (ref 6–23)
CO2: 28 mEq/L (ref 19–32)
Calcium: 9.4 mg/dL (ref 8.4–10.5)
Creatinine, Ser: 0.68 mg/dL (ref 0.50–1.35)
GFR calc Af Amer: >90 mL/min (ref >90)

## 2013-03-14 LAB — CBC
MCHC: 31.2 g/dL (ref 30.0–36.0)
MCV: 85.1 fL (ref 78.0–100.0)
Platelets: 181 10*3/uL (ref 150–400)
RDW: 16.8 % — ABNORMAL HIGH (ref 11.5–15.5)
WBC: 9.4 10*3/uL (ref 4.0–10.5)

## 2013-03-14 LAB — ABO/RH: ABO/RH(D): A POS

## 2013-03-14 LAB — HEPARIN LEVEL (UNFRACTIONATED): Heparin Unfractionated: 0.13 IU/mL — ABNORMAL LOW (ref 0.30–0.70)

## 2013-03-14 LAB — PRO B NATRIURETIC PEPTIDE: Pro B Natriuretic peptide (BNP): 4169 pg/mL — ABNORMAL HIGH (ref 0–125)

## 2013-03-14 SURGERY — ANGIOPLASTY, USING PATCH GRAFT
Anesthesia: General | Site: Leg Upper | Laterality: Right

## 2013-03-14 MED ORDER — PHENYLEPHRINE HCL 10 MG/ML IJ SOLN
10.0000 mg | INTRAVENOUS | Status: DC | PRN
  Administered 2013-03-14: 25 ug/min via INTRAVENOUS

## 2013-03-14 MED ORDER — GLYBURIDE 5 MG PO TABS
5.0000 mg | ORAL_TABLET | ORAL | Status: AC
  Administered 2013-03-14 (×2): 5 mg via ORAL
  Filled 2013-03-14 (×2): qty 1

## 2013-03-14 MED ORDER — FERROUS GLUCONATE 324 (38 FE) MG PO TABS
324.0000 mg | ORAL_TABLET | Freq: Every day | ORAL | Status: DC
  Administered 2013-03-14 – 2013-03-25 (×11): 324 mg via ORAL
  Filled 2013-03-14 (×15): qty 1

## 2013-03-14 MED ORDER — FERROUS GLUCONATE 324 (38 FE) MG PO TABS
324.0000 mg | ORAL_TABLET | Freq: Every day | ORAL | Status: DC
  Filled 2013-03-14: qty 1

## 2013-03-14 MED ORDER — ARTIFICIAL TEARS OP OINT
TOPICAL_OINTMENT | OPHTHALMIC | Status: DC | PRN
  Administered 2013-03-14: 1 via OPHTHALMIC

## 2013-03-14 MED ORDER — ROCURONIUM BROMIDE 100 MG/10ML IV SOLN
INTRAVENOUS | Status: DC | PRN
  Administered 2013-03-14 (×2): 50 mg via INTRAVENOUS
  Administered 2013-03-15: 20 mg via INTRAVENOUS
  Administered 2013-03-15: 30 mg via INTRAVENOUS

## 2013-03-14 MED ORDER — ETOMIDATE 2 MG/ML IV SOLN
INTRAVENOUS | Status: DC | PRN
  Administered 2013-03-14: 14 mg via INTRAVENOUS

## 2013-03-14 MED ORDER — MIDAZOLAM HCL 5 MG/5ML IJ SOLN
INTRAMUSCULAR | Status: DC | PRN
  Administered 2013-03-14 – 2013-03-15 (×2): 2 mg via INTRAVENOUS

## 2013-03-14 MED ORDER — MORPHINE SULFATE 2 MG/ML IJ SOLN
2.0000 mg | Freq: Once | INTRAMUSCULAR | Status: AC
  Administered 2013-03-14: 2 mg via INTRAVENOUS

## 2013-03-14 MED ORDER — LACTATED RINGERS IV SOLN
INTRAVENOUS | Status: DC | PRN
  Administered 2013-03-14: 22:00:00 via INTRAVENOUS

## 2013-03-14 MED ORDER — DEXTROSE 5 % IV SOLN
1.5000 g | INTRAVENOUS | Status: AC
  Administered 2013-03-14: 1.5 g via INTRAVENOUS
  Filled 2013-03-14: qty 1.5

## 2013-03-14 MED ORDER — GLYBURIDE 5 MG PO TABS
10.0000 mg | ORAL_TABLET | Freq: Every day | ORAL | Status: DC
  Administered 2013-03-16 – 2013-03-17 (×2): 10 mg via ORAL
  Filled 2013-03-14 (×4): qty 2

## 2013-03-14 MED ORDER — DOPAMINE-DEXTROSE 3.2-5 MG/ML-% IV SOLN
2.0000 ug/kg/min | INTRAVENOUS | Status: DC
  Administered 2013-03-14: 8 ug/kg/min via INTRAVENOUS

## 2013-03-14 MED ORDER — SUCCINYLCHOLINE CHLORIDE 20 MG/ML IJ SOLN
INTRAMUSCULAR | Status: DC | PRN
  Administered 2013-03-14: 120 mg via INTRAVENOUS

## 2013-03-14 MED ORDER — HYDROCODONE-ACETAMINOPHEN 10-325 MG PO TABS
1.0000 | ORAL_TABLET | Freq: Every day | ORAL | Status: DC | PRN
  Administered 2013-03-14 (×2): 1 via ORAL
  Administered 2013-03-15 – 2013-03-16 (×2): 2 via ORAL
  Filled 2013-03-14 (×3): qty 2

## 2013-03-14 MED ORDER — LIDOCAINE HCL (CARDIAC) 20 MG/ML IV SOLN
INTRAVENOUS | Status: DC | PRN
  Administered 2013-03-14: 50 mg via INTRAVENOUS

## 2013-03-14 MED ORDER — DOPAMINE-DEXTROSE 3.2-5 MG/ML-% IV SOLN
INTRAVENOUS | Status: AC
  Administered 2013-03-14: 8 ug/kg/min
  Filled 2013-03-14: qty 250

## 2013-03-14 MED ORDER — MORPHINE SULFATE 2 MG/ML IJ SOLN
INTRAMUSCULAR | Status: AC
  Administered 2013-03-14: 2 mg via INTRAVENOUS
  Filled 2013-03-14: qty 1

## 2013-03-14 MED ORDER — LACTATED RINGERS IV SOLN
INTRAVENOUS | Status: DC | PRN
  Administered 2013-03-14 – 2013-03-15 (×2): via INTRAVENOUS

## 2013-03-14 MED ORDER — DILTIAZEM HCL 60 MG PO TABS
60.0000 mg | ORAL_TABLET | Freq: Four times a day (QID) | ORAL | Status: DC
  Administered 2013-03-14 (×2): 60 mg via ORAL
  Filled 2013-03-14 (×8): qty 1

## 2013-03-14 MED ORDER — SODIUM CHLORIDE 0.9 % IV SOLN
INTRAVENOUS | Status: DC | PRN
  Administered 2013-03-14: 23:00:00 via INTRAVENOUS

## 2013-03-14 MED ORDER — FENTANYL CITRATE 0.05 MG/ML IJ SOLN
INTRAMUSCULAR | Status: DC | PRN
  Administered 2013-03-14: 50 ug via INTRAVENOUS
  Administered 2013-03-14 (×2): 25 ug via INTRAVENOUS
  Administered 2013-03-15 (×3): 50 ug via INTRAVENOUS
  Administered 2013-03-15 (×2): 100 ug via INTRAVENOUS
  Administered 2013-03-15: 50 ug via INTRAVENOUS

## 2013-03-14 MED ORDER — COLLAGENASE 250 UNIT/GM EX OINT
TOPICAL_OINTMENT | Freq: Every day | CUTANEOUS | Status: DC
  Administered 2013-03-14 – 2013-03-15 (×2): via TOPICAL
  Administered 2013-03-16: 1 via TOPICAL
  Administered 2013-03-17 – 2013-03-21 (×5): via TOPICAL
  Administered 2013-03-22: 1 via TOPICAL
  Administered 2013-03-23 – 2013-03-25 (×3): via TOPICAL
  Filled 2013-03-14 (×2): qty 30

## 2013-03-14 MED ORDER — ALBUMIN HUMAN 5 % IV SOLN
INTRAVENOUS | Status: DC | PRN
  Administered 2013-03-14 – 2013-03-15 (×2): via INTRAVENOUS

## 2013-03-14 MED ORDER — ATROPINE SULFATE 0.1 MG/ML IJ SOLN
INTRAMUSCULAR | Status: AC
  Filled 2013-03-14: qty 10

## 2013-03-14 MED ORDER — HEPARIN SODIUM (PORCINE) 1000 UNIT/ML IJ SOLN
INTRAMUSCULAR | Status: DC | PRN
  Administered 2013-03-14: 6000 [IU] via INTRAVENOUS
  Administered 2013-03-15 (×2): 2000 [IU] via INTRAVENOUS

## 2013-03-14 MED ORDER — HEPARIN BOLUS VIA INFUSION
2000.0000 [IU] | Freq: Once | INTRAVENOUS | Status: AC
  Administered 2013-03-14: 2000 [IU] via INTRAVENOUS
  Filled 2013-03-14: qty 2000

## 2013-03-14 MED ORDER — CARVEDILOL 25 MG PO TABS
37.5000 mg | ORAL_TABLET | Freq: Two times a day (BID) | ORAL | Status: DC
  Administered 2013-03-14: 37.5 mg via ORAL
  Filled 2013-03-14 (×4): qty 1

## 2013-03-14 SURGICAL SUPPLY — 77 items
ADH SKN CLS APL DERMABOND .7 (GAUZE/BANDAGES/DRESSINGS) ×3
BAG BANDED W/RUBBER/TAPE 36X54 (MISCELLANEOUS) ×2 IMPLANT
BAG EQP BAND 135X91 W/RBR TAPE (MISCELLANEOUS) ×3
BAG SNAP BAND KOVER 36X36 (MISCELLANEOUS) ×4 IMPLANT
CANISTER SUCTION 2500CC (MISCELLANEOUS) ×4 IMPLANT
CATH BEACON 5.038 65CM KMP-01 (CATHETERS) ×2 IMPLANT
CATH EMB 4FR 80CM (CATHETERS) ×2 IMPLANT
CLIP TI MEDIUM 24 (CLIP) ×4 IMPLANT
CLIP TI WIDE RED SMALL 24 (CLIP) ×4 IMPLANT
CONT SPEC 4OZ CLIKSEAL STRL BL (MISCELLANEOUS) ×2 IMPLANT
COVER DOME SNAP 22 D (MISCELLANEOUS) ×4 IMPLANT
COVER SURGICAL LIGHT HANDLE (MISCELLANEOUS) ×4 IMPLANT
DERMABOND ADVANCED (GAUZE/BANDAGES/DRESSINGS) ×1
DERMABOND ADVANCED .7 DNX12 (GAUZE/BANDAGES/DRESSINGS) ×3 IMPLANT
DRAIN SNY 10X20 3/4 PERF (WOUND CARE) IMPLANT
DRAPE INCISE IOBAN 66X45 STRL (DRAPES) ×7 IMPLANT
DRAPE WARM FLUID 44X44 (DRAPE) ×4 IMPLANT
DRAPE X-RAY CASS 24X20 (DRAPES) IMPLANT
DRSG COVADERM 4X10 (GAUZE/BANDAGES/DRESSINGS) IMPLANT
DRSG COVADERM 4X8 (GAUZE/BANDAGES/DRESSINGS) IMPLANT
ELECT REM PT RETURN 9FT ADLT (ELECTROSURGICAL) ×4
ELECTRODE REM PT RTRN 9FT ADLT (ELECTROSURGICAL) ×3 IMPLANT
EVACUATOR SILICONE 100CC (DRAIN) IMPLANT
GAUZE SPONGE 4X4 16PLY XRAY LF (GAUZE/BANDAGES/DRESSINGS) IMPLANT
GLOVE BIOGEL PI IND STRL 6.5 (GLOVE) ×6 IMPLANT
GLOVE BIOGEL PI IND STRL 7.5 (GLOVE) ×8 IMPLANT
GLOVE BIOGEL PI INDICATOR 6.5 (GLOVE) ×6
GLOVE BIOGEL PI INDICATOR 7.5 (GLOVE) ×6
GLOVE ECLIPSE 6.5 STRL STRAW (GLOVE) ×4 IMPLANT
GLOVE SURG SS PI 7.5 STRL IVOR (GLOVE) ×14 IMPLANT
GOWN PREVENTION PLUS LG XLONG (DISPOSABLE) ×4 IMPLANT
GOWN PREVENTION PLUS XXLARGE (GOWN DISPOSABLE) ×8 IMPLANT
GOWN STRL NON-REIN LRG LVL3 (GOWN DISPOSABLE) ×10 IMPLANT
HEMOSTAT SNOW SURGICEL 2X4 (HEMOSTASIS) ×2 IMPLANT
INSERT FOGARTY SM (MISCELLANEOUS) IMPLANT
KIT BASIN OR (CUSTOM PROCEDURE TRAY) ×4 IMPLANT
KIT ENCORE 26 ADVANTAGE (KITS) ×3 IMPLANT
KIT ROOM TURNOVER OR (KITS) ×4 IMPLANT
NDL PERC 18GX7CM (NEEDLE) IMPLANT
NEEDLE PERC 18GX7CM (NEEDLE) ×4 IMPLANT
NS IRRIG 1000ML POUR BTL (IV SOLUTION) ×11 IMPLANT
PACK PERIPHERAL VASCULAR (CUSTOM PROCEDURE TRAY) ×4 IMPLANT
PAD ARMBOARD 7.5X6 YLW CONV (MISCELLANEOUS) ×8 IMPLANT
PATCH VASCULAR VASCU GUARD 1X6 (Vascular Products) ×2 IMPLANT
SET COLLECT BLD 21X3/4 12 (NEEDLE) IMPLANT
SET MICROPUNCTURE 5F STIFF (MISCELLANEOUS) ×2 IMPLANT
SHEATH BRITE TIP 7FR 35CM (SHEATH) ×2 IMPLANT
SLEEVE SURGEON STRL (DRAPES) ×2 IMPLANT
STENT PROTEGE 7X120X120 (Stent) ×3 IMPLANT
STOPCOCK 4 WAY LG BORE MALE ST (IV SETS) IMPLANT
STOPCOCK MORSE 400PSI 3WAY (MISCELLANEOUS) ×2 IMPLANT
SUT GORETEX 5 0 TT13 24 (SUTURE) IMPLANT
SUT GORETEX 6.0 TT9 (SUTURE) IMPLANT
SUT PROLENE 5 0 C 1 24 (SUTURE) ×6 IMPLANT
SUT PROLENE 5 0 C 1 36 (SUTURE) ×4 IMPLANT
SUT PROLENE 6 0 BV (SUTURE) ×6 IMPLANT
SUT PROLENE 6 0 CC (SUTURE) IMPLANT
SUT PROLENE 7 0 BV1 MDA (SUTURE) ×4 IMPLANT
SUT SILK 2 0 SH (SUTURE) IMPLANT
SUT VIC AB 2-0 CT1 27 (SUTURE) ×12
SUT VIC AB 2-0 CT1 TAPERPNT 27 (SUTURE) ×7 IMPLANT
SUT VIC AB 3-0 SH 27 (SUTURE) ×16
SUT VIC AB 3-0 SH 27X BRD (SUTURE) ×8 IMPLANT
SUT VICRYL 4-0 PS2 18IN ABS (SUTURE) ×6 IMPLANT
SYR 3ML LL SCALE MARK (SYRINGE) ×2 IMPLANT
SYR MEDRAD MARK V 150ML (SYRINGE) ×2 IMPLANT
SYRINGE 10CC LL (SYRINGE) ×9 IMPLANT
TAPE UMBILICAL COTTON 1/8X30 (MISCELLANEOUS) IMPLANT
TOWEL OR 17X24 6PK STRL BLUE (TOWEL DISPOSABLE) ×8 IMPLANT
TOWEL OR 17X26 10 PK STRL BLUE (TOWEL DISPOSABLE) ×4 IMPLANT
TRAY FOLEY CATH 16FRSI W/METER (SET/KITS/TRAYS/PACK) ×4 IMPLANT
TUBING ART PRESS 48 MALE/FEM (TUBING) ×2 IMPLANT
TUBING EXTENTION W/L.L. (IV SETS) ×2 IMPLANT
TUBING HIGH PRESSURE 120CM (CONNECTOR) ×2 IMPLANT
WATER STERILE IRR 1000ML POUR (IV SOLUTION) ×4 IMPLANT
WIRE BENTSON .035X145CM (WIRE) ×4 IMPLANT
WIRE HI TORQ VERSACORE J 260CM (WIRE) ×6 IMPLANT

## 2013-03-14 NOTE — Anesthesia Preprocedure Evaluation (Signed)
Anesthesia Evaluation  Patient identified by MRN, date of birth, ID band Patient awake    Reviewed: Allergy & Precautions, H&P , NPO status , Patient's Chart, lab work & pertinent test results, reviewed documented beta blocker date and time   Airway Mallampati: II TM Distance: >3 FB Neck ROM: full    Dental   Pulmonary neg pulmonary ROS, former smoker,  breath sounds clear to auscultation        Cardiovascular hypertension, On Medications and On Home Beta Blockers + CAD, + Past MI, + CABG, + Peripheral Vascular Disease and +CHF negative cardio ROS  + dysrhythmias Atrial Fibrillation + pacemaker + Cardiac Defibrillator Rhythm:regular     Neuro/Psych negative neurological ROS  negative psych ROS   GI/Hepatic Neg liver ROS, GERD-  Medicated and Controlled,  Endo/Other  diabetes, Insulin Dependent  Renal/GU Renal InsufficiencyRenal disease  negative genitourinary   Musculoskeletal   Abdominal   Peds  Hematology negative hematology ROS (+)   Anesthesia Other Findings See surgeon's H&P   Reproductive/Obstetrics negative OB ROS                           Anesthesia Physical Anesthesia Plan  ASA: IV and emergent  Anesthesia Plan: General   Post-op Pain Management:    Induction: Intravenous  Airway Management Planned: Oral ETT  Additional Equipment: Arterial line  Intra-op Plan:   Post-operative Plan: Post-operative intubation/ventilation  Informed Consent: I have reviewed the patients History and Physical, chart, labs and discussed the procedure including the risks, benefits and alternatives for the proposed anesthesia with the patient or authorized representative who has indicated his/her understanding and acceptance.   Dental Advisory Given  Plan Discussed with: CRNA and Surgeon  Anesthesia Plan Comments:         Anesthesia Quick Evaluation

## 2013-03-14 NOTE — Consult Note (Addendum)
WOC wound consult note Reason for Consult: Consult requested for right ankle wounds.  Pt states he has decreased perfusion and is scheduled to receive revascularization to right leg.  Chronic wounds to left leg healed after he had this procedure last year.   Wound type: Full thickness arterial wounds to right outer ankle.  They have declined in the past several weeks. Measurement: .2X.2cm and .3X.3cm Wound bed: Both sites 100% yellow slough, mod amt drainage, red partial thickness patchy areas of satellite lesions surrounding which pt states are the result of a reaction to some adherent dressings he tried. Drainage (amount, consistency, odor) Mod amt yellow drainage, no odor. Periwound:intact skin surrounding Dressing procedure/placement/frequency: Santyl ointment to chemically debride non-viable tissue.  Non adherent dressing and kerlex to absorb drainage and avoid irritation to skin surrounding wound. Described wound care to pt after he is discharged and he appeared to understand. Please re-consult if further assistance is needed.  Thank-you,  Cammie Mcgee MSN, RN, CWOCN, [place], CNS [phone]

## 2013-03-14 NOTE — Progress Notes (Signed)
    Subjective  -  The patient was brought in for angiography for a right leg ulcer however this was canceled secondary to the patient being found to be in atrial fibrillation with rapid ventricular response.  He was admitted to the cardiology service.  Approximately 8:00 this evening he became bradycardic and hypotensive with blood pressures in the 70s and heart rate in the 40s.  This was associated with diaphoresis and nausea.  He began complaining of severe right groin pain.  His hemodynamics improved with the initiation of dopamine.  His right leg was initially warm, however this progressed to a mottled appearance.  He continued to complain of right groin pain which extended down into the area behind the knee.  He developed cyanosis of his toes as well.   Physical Exam:  The patient is somewhat uncomfortable in bed. His abdomen is soft. I cannot palpate or Doppler signals in the right femoral, popliteal, or pedal vessels. The right leg is and model with a cyanosis from the inguinal crease down to the toes.  I do palpate a right radial and left femoral pulse.       Assessment/Plan:  Acute ischemia, right leg  The patient has developed acute ischemia to the right leg.  At this time I am unsure of the etiology of his ischemia..  He has been on heparin.  His last heparin level was therapeutic.  This is either an embolic event from his atrial fibrillation or progressive stenosis leading to occlusion from native vascular disease.  Regardless, the patient has a limb threatening condition.  I discussed this with his wife who is at home and over a one-hour a way.  I feel that he needs to go emergently to the operating room for revascularization.  I told the patient I am not exactly sure what the best option for revascularization will be, this will depend on the operative findings.  I discussed proceeding with possible axillary femoral bypass vs. a femoral-femoral bypass vs. iliac stenting.  He also  will potentially require thrombectomy.  He is in agreement and wants to proceed.  Analie Katzman IV, VAnner Crete 03/14/2013 9:31 PM --  Ceasar Mons Vitals:   03/14/13 2100  BP:   Pulse: 68  Temp:   Resp: 13    Intake/Output Summary (Last 24 hours) at 03/14/13 2131 Last data filed at 03/14/13 2030  Gross per 24 hour  Intake 1548.96 ml  Output   3300 ml  Net -1751.04 ml     Laboratory CBC    Component Value Date/Time   WBC 9.4 03/14/2013 0820   HGB 11.6* 03/14/2013 0820   HCT 37.2* 03/14/2013 0820   PLT 181 03/14/2013 0820    BMET    Component Value Date/Time   NA 140 03/14/2013 0820   K 3.5 03/14/2013 0820   CL 100 03/14/2013 0820   CO2 28 03/14/2013 0820   GLUCOSE 199* 03/14/2013 0820   BUN 12 03/14/2013 0820   CREATININE 0.68 03/14/2013 0820   CALCIUM 9.4 03/14/2013 0820   GFRNONAA >90 03/14/2013 0820   GFRAA >90 03/14/2013 0820    COAG Lab Results  Component Value Date   INR 1.06 03/13/2013   No results found for this basename: PTT    Antibiotics Anti-infectives   None       V. Charlena Cross, M.D. [facility] Office: [phone] Pager:  [phone]

## 2013-03-14 NOTE — Progress Notes (Signed)
ANTICOAGULATION CONSULT NOTE  Pharmacy Consult for heparin Indication: atrial fibrillation  Allergies  Allergen Reactions  . Lodine [Etodolac] Nausea And Vomiting  . Methotrexate Derivatives Nausea And Vomiting  . Ranitidine Hcl Nausea Only  . Remicade [Infliximab] Other (See Comments)    Chills and shakes     Patient Measurements: Height: 5\' 9"  (175.3 cm) Weight: 207 lb 7.3 oz (94.1 kg) IBW/kg (Calculated) : 70.7 Heparin Dosing Weight: 82 kg  Vital Signs: Temp: 98 F (36.7 C) (12/16 0759) Temp src: Oral (12/16 0759) BP: 133/70 mmHg (12/16 1004) Pulse Rate: 89 (12/16 0759)  Labs:  Recent Labs  03/13/13 0718 03/13/13 1110 03/13/13 2115 03/13/13 2327 03/14/13 0820  HGB 12.2*  --  10.7*  --  11.6*  HCT 36.0*  --  34.7*  --  37.2*  PLT  --   --  186  --  181  APTT  --  24  --   --   --   LABPROT  --  13.6  --   --   --   INR  --  1.06  --   --   --   HEPARINUNFRC  --   --   --  0.13* 0.65  CREATININE 0.90 0.68  --   --  0.68    Estimated Creatinine Clearance: 111.3 ml/min (by C-G formula based on Cr of 0.68).  Assessment: 62 yo M admitted 03/13/2013 for a peripheral arteriogram. He was found to be in atrial fibrillation with a rapid ventricular response. Arteriogram postponed until rate controlled. Pharmacy consulted to dose heparin.  PMH: CHF, afib, CAD s/p CABG, DM, PVD  Coag: Afib,  Heparin at goal on 1550 units/hr, H/H stable, no bleeding noted  CV:  ASA, atorva, coreg, zetia, furo 40 IV bid, hydralazine, IMDUR, Lovaza, K 40 daily, dilt ggt HR 80s, SBP 130-160s  Renal/FEN: CrCl ~110 ml/min, Ca+D, Fe   Goal of Therapy:  Heparin level 0.3-0.7 units/ml Monitor platelets by anticoagulation protocol: Yes   Plan:  Continue heparin 1550 units/hr Follow-up am labs. Follow up plan for chronic anticoagulation   Thank you for allowing pharmacy to be a part of this patients care team.  Lovenia Kim Pharm.D., BCPS Clinical Pharmacist 03/14/2013 10:34  AM Pager: [phone] Phone: [phone]

## 2013-03-14 NOTE — Progress Notes (Signed)
Pt transported from SDU to ICU. Pt noted to be on 2L [place] O2 sats 82-85. Pt placed on 6L [place], and O2 sats noted to be 96. Pt c/o pain 10 out of 10 in RLE. RLE noted to be mottled, no papalable pedal or tibial pulse noted. Unable to get pulse using doppler. Pt given PRN Zofran for N/V, and dopamine because of decreased BP. Mayford Knife, MD and Myra Gianotti, MD at bedside. Pt administered 2 mg Morphine per Brabham, MD. Pt transferred to OR.

## 2013-03-14 NOTE — Progress Notes (Signed)
Inpatient Diabetes Program Recommendations  AACE/ADA: New Consensus Statement on Inpatient Glycemic Control (2013)  Target Ranges:  Prepandial:   less than 140 mg/dL      Peak postprandial:   less than 180 mg/dL (1-2 hours)      Critically ill patients:  140 - 180 mg/dL  Results for NIKOLAJ, GERAGHTY (MRN [id number]) as of 03/14/2013 09:39  Ref. Range 03/13/2013 06:44 03/13/2013 12:01 03/13/2013 16:36 03/13/2013 21:46 03/14/2013 08:04  Glucose-Capillary Latest Range: 70-99 mg/dL 308 (H) 657 (H) 846 (H) 336 (H) 202 (H)    Inpatient Diabetes Program Recommendations Insulin - Basal: consider adding basal Lantus or Levemir 15 units at HS A1C=9  Will need follow up with primary MD after discharge for glycemic management Thank you  Piedad Climes BSN, RN,CDE Inpatient Diabetes Coordinator [phone] (team pager)

## 2013-03-14 NOTE — Progress Notes (Signed)
   VASCULAR PROGRESS NOTE  SUBJECTIVE: No specific complaints.  PHYSICAL EXAM: Filed Vitals:   03/14/13 1004 03/14/13 1200 03/14/13 1319 03/14/13 1600  BP: 133/70 121/58 129/47 135/50  Pulse:      Temp:  98.2 F (36.8 C)  97.8 F (36.6 C)  TempSrc:  Oral  Oral  Resp:  13    Height:      Weight:      SpO2:  94%  95%   No change in wounds on the lateral aspect of right ankle.  LABS: Lab Results  Component Value Date   WBC 9.4 03/14/2013   HGB 11.6* 03/14/2013   HCT 37.2* 03/14/2013   MCV 85.1 03/14/2013   PLT 181 03/14/2013   Lab Results  Component Value Date   CREATININE 0.68 03/14/2013   Lab Results  Component Value Date   INR 1.06 03/13/2013   CBG (last 3)   Recent Labs  03/13/13 2146 03/14/13 0804 03/14/13 1232  GLUCAP 336* 202* 234*    Principal Problem:   Atrial fibrillation Active Problems:   Embolism and thrombosis of iliac artery   Peripheral vascular disease, unspecified   Acute on chronic systolic CHF (congestive heart failure)   Diabetes mellitus type 2 with peripheral artery disease   CAD (coronary artery disease)   S/P CABG x 5   Atrial fibrillation, rapid  ASSESSMENT AND PLAN:  The patient was admitted yesterday by cardiology with atrial fibrillation with a rapid ventricular response. He had been brought in for an arteriogram for multilevel arterial occlusive disease in the wound adjacent to his right lateral malleolus. His arteriogram was canceled when he was found to be in atrial fibrillation with a rapid ventricular response. He is on diltiazem for rate control and is also being treated for acute on chronic systolic congestive heart failure. If ready from a medical standpoint we could potentially proceed with his arteriogram later in the week. Will try to schedule for Thursday.   Cari Caraway Beeper: [phone] 03/14/2013

## 2013-03-14 NOTE — Progress Notes (Addendum)
CTSP secondary to acute bradycardia, hypotension and right groin pain.  He was on IV Cardizem earlier for rapid afib but that was stopped around noon.  This evening he became bradycardic in afib into the 40's with hypotension with HR in the 70's.  He became diaphoretic and nauseated with dry heaves.  He complained of severe right groin pain.  He was started on Dopamine gtt with improvement in HR to th 60's.  He continues to have severe right groin pain.  His exam shows clear lungs and heart is irregularly irregular with controlled HR.  He is very tender to palpation in his right groin with no palpable pulse.  His RLE is warm to touch.  Will hold hydralazine, carvedilol, Imdur, avapro and dilt for now.  Give 250cc IV bolus NS since lungs are clear.  Zofran for nausea. Transfer to CCU. After transfer to CCU, RLE became cool to touch with mottled appearance and cyanotic appearing toes.  Dr. Myra Gianotti was notified and is now here to evaluate.  A total of 90 minutes of critical care time was spent with patient.

## 2013-03-14 NOTE — Progress Notes (Addendum)
Patient Name: [name] [name] Date of Encounter: 03/14/2013  Principal Problem:   Atrial fibrillation - rapid Active Problems:   Embolism and thrombosis of iliac artery   Peripheral vascular disease, unspecified   Acute on chronic systolic CHF (congestive heart failure)   Diabetes mellitus type 2 with peripheral artery disease   CAD (coronary artery disease)   S/P CABG x 5    SUBJECTIVE: No chest pain, SOB better. Pain in right foot keeping him awake.  OBJECTIVE Filed Vitals:   03/14/13 0200 03/14/13 0300 03/14/13 0400 03/14/13 0444  BP: 153/80 143/75 150/72   Pulse:      Temp:   97.7 F (36.5 C)   TempSrc:   Oral   Resp: 19 15 15    Height:      Weight:    207 lb 7.3 oz (94.1 kg)  SpO2: 94% 97% 95%     Intake/Output Summary (Last 24 hours) at 03/14/13 0744 Last data filed at 03/14/13 0600  Gross per 24 hour  Intake 1086.28 ml  Output   3375 ml  Net -2288.72 ml   Filed Weights   03/13/13 0701 03/13/13 1345 03/14/13 0444  Weight: 216 lb (97.977 kg) 212 lb 11.9 oz (96.5 kg) 207 lb 7.3 oz (94.1 kg)    PHYSICAL EXAM General: Well developed, well nourished, male in no acute distress. Head: Normocephalic, atraumatic.  Neck: Supple without bruits, JVDat 9 cm. Lungs:  Resp regular and unlabored, rales bilateral bases. Heart: Irregular R&R, S1, S2, no S3, S4, or murmur; no rub. Abdomen: Soft, non-tender, non-distended, BS + x 4.  Extremities: No clubbing, cyanosis, no edema.  Neuro: Alert and oriented X 3. Moves all extremities spontaneously. Psych: Normal affect.  LABS: BNP, BMET, CBC today pending CBC: Recent Labs  03/13/13 0718 03/13/13 2115  WBC  --  9.6  HGB 12.2* 10.7*  HCT 36.0* 34.7*  MCV  --  85.0  PLT  --  186   INR: Recent Labs  03/13/13 1110  INR 1.06   Basic Metabolic Panel: Recent Labs  03/13/13 0718 03/13/13 1110  NA 138 139  K 3.3* 3.6  CL 101 101  CO2  --  26  GLUCOSE 262* 203*  BUN 15 13  CREATININE 0.90 0.68    CALCIUM  --  9.8   Liver Function Tests: Recent Labs  03/13/13 1110  AST 8  ALT 16  ALKPHOS 67  BILITOT 0.3  PROT 6.4  ALBUMIN 3.2*   Cardiac Enzymes:  Hemoglobin A1C: Recent Labs  03/13/13 1110  HGBA1C 9.0*   Thyroid Function Tests: Recent Labs  03/13/13 1110  TSH 0.744   TELE:  Atrial flutter/fibrillation, RVR much of the time, especially with any activity   ECG: Atrial flutter, rate 94  Radiology/Studies: Dg Chest Port 1 View 03/13/2013   CLINICAL DATA:  CHF.  EXAM: PORTABLE CHEST - 1 VIEW  COMPARISON:  11/09/2006.  FINDINGS: Pacemaker enters from the left with leads in the region of the right ventricle. Battery pack limits evaluation of the left upper lung zone.  Post CABG.  Cardiomegaly.  Remote rib fractures.  No gross pneumothorax.  No segmental consolidation or pulmonary edema.  The patient would eventually benefit from two view chest.  IMPRESSION: Pacemaker enters from the left with leads in the region of the right ventricle. Battery pack limits evaluation of the left upper lung zone.  Post CABG.  Cardiomegaly.  No segmental consolidation or pulmonary edema.   Electronically Signed  By: Bridgett Larsson M.D.   On: 03/13/2013 16:42     Current Medications:  . aspirin EC  81 mg Oral Daily  . atorvastatin  10 mg Oral q1800  . calcium-vitamin D  2 tablet Oral Q breakfast  . carvedilol  25 mg Oral BID WC  . clobetasol cream  1 application Topical BID  . clopidogrel  75 mg Oral Q breakfast  . ezetimibe  10 mg Oral Daily  . ferrous gluconate  325 mg Oral Q breakfast  . furosemide  40 mg Intravenous BID  . gabapentin  1,200 mg Oral QHS  . gabapentin  600 mg Oral Daily  . hydrALAZINE  25 mg Oral BID  . insulin aspart  0-15 Units Subcutaneous TID WC  . insulin aspart  0-5 Units Subcutaneous QHS  . irbesartan  75 mg Oral Daily  . isosorbide mononitrate  60 mg Oral Daily  . leflunomide  20 mg Oral Daily  . linagliptin  5 mg Oral QAC supper  . omega-3 acid ethyl  esters  2 g Oral Daily  . pantoprazole  80 mg Oral BID WC  . potassium chloride  40 mEq Oral Daily  . predniSONE  5 mg Oral BID WC  . sodium chloride  3 mL Intravenous Q12H   . diltiazem (CARDIZEM) infusion 5 mg/hr (03/14/13 0351)  . heparin 1,550 Units/hr (03/14/13 0447)    ASSESSMENT AND PLAN: Principal Problem:   Atrial fibrillation - RVR most of the time, continue Coreg, titrate Cardizem for better rate control. [employer] ICD interrogated yest, Single lead device, ventricular rates have been elevated much of the time for > 1 month.  Active Problems:   Embolism and thrombosis of iliac artery - per Dr. Edilia Bo   Peripheral vascular disease, unspecified - per Dr. Edilia Bo    Acute on chronic systolic CHF (congestive heart failure) - continue diuresis, get echo read today.    Diabetes mellitus type 2 with peripheral artery disease - A1c is 9.0, on Glucovance (glucophage held, will add back glyburide) and Januvia, encourage diet compliance, SSI now, med changes per MD.    CAD (coronary artery disease)/S/P CABG x 5/ICM - on ASA/BB, NOT on ACE/ARB because of ARF with ARB and hyperkalemia (per Dr's notes faxed over).   Signed, Theodore Demark , PA-C 7:44 AM 03/14/2013 Patient seen and examined and history reviewed. Agree with above findings and plan. Excellent diuresis. Weight down 9 lbs. I/O negative over 2 liters. Edema is down. Echo shows EF 25-30%. Moderate MR. Moderate biatrial enlargement. AFib rate control is better but HR still increases with activity. Will transition diltiazem to po. Increase Coreg dose. Continue IV diuresis. Patient should be stable for peripheral angiography tomorrow or Thursday. Cannot start oral anticoagulants or consider DCCV until his PV situation is resolved. Renal function is normal now. Will watch closely with ARB and diuretics. I think prior renal issues may have been related to Enbrel. Avoid NSAIDs.  Theron Arista [facility] 03/14/2013 10:56 AM

## 2013-03-14 NOTE — Anesthesia Procedure Notes (Signed)
Procedure Name: Intubation Date/Time: 03/14/2013 10:52 PM Performed by: Luster Landsberg Pre-anesthesia Checklist: Patient identified, Emergency Drugs available, Suction available, Patient being monitored and Timeout performed Patient Re-evaluated:Patient Re-evaluated prior to inductionOxygen Delivery Method: Circle system utilized Preoxygenation: Pre-oxygenation with 100% oxygen Intubation Type: IV induction, Rapid sequence and Cricoid Pressure applied Laryngoscope Size: Mac and 3 Grade View: Grade I Tube type: Subglottic suction tube Tube size: 7.5 mm Number of attempts: 1 Airway Equipment and Method: Stylet Placement Confirmation: ETT inserted through vocal cords under direct vision,  positive ETCO2,  CO2 detector and breath sounds checked- equal and bilateral Secured at: 23 cm Tube secured with: Tape Dental Injury: Teeth and Oropharynx as per pre-operative assessment

## 2013-03-14 NOTE — Progress Notes (Signed)
ANTICOAGULATION CONSULT NOTE  Pharmacy Consult for heparin Indication: atrial fibrillation  Allergies  Allergen Reactions  . Lodine [Etodolac] Nausea And Vomiting  . Methotrexate Derivatives Nausea And Vomiting  . Ranitidine Hcl Nausea Only  . Remicade [Infliximab] Other (See Comments)    Chills and shakes     Patient Measurements: Height: 5\' 9"  (175.3 cm) Weight: 212 lb 11.9 oz (96.5 kg) IBW/kg (Calculated) : 70.7 Heparin Dosing Weight: 82 kg  Vital Signs: Temp: 97.6 F (36.4 C) (12/15 2300) Temp src: Oral (12/15 2300) BP: 149/61 mmHg (12/15 2200) Pulse Rate: 102 (12/15 1634)  Labs:  Recent Labs  03/13/13 0718 03/13/13 1110 03/13/13 2115 03/13/13 2327  HGB 12.2*  --  10.7*  --   HCT 36.0*  --  34.7*  --   PLT  --   --  186  --   APTT  --  24  --   --   LABPROT  --  13.6  --   --   INR  --  1.06  --   --   HEPARINUNFRC  --   --   --  0.13*  CREATININE 0.90 0.68  --   --     Estimated Creatinine Clearance: 112.5 ml/min (by C-G formula based on Cr of 0.68).  Assessment: 62 yo Male with Afib for heparin  Goal of Therapy:  Heparin level 0.3-0.7 units/ml Monitor platelets by anticoagulation protocol: Yes   Plan:  Heparin 2000 units IV bolus, then increase heparin 1550 units/hr Follow-up am labs.  Geannie Risen, PharmD, BCPS  03/14/2013 12:33 AM

## 2013-03-15 ENCOUNTER — Inpatient Hospital Stay (HOSPITAL_COMMUNITY): Payer: Medicare Other

## 2013-03-15 DIAGNOSIS — Z9911 Dependence on respirator [ventilator] status: Secondary | ICD-10-CM

## 2013-03-15 DIAGNOSIS — I70229 Atherosclerosis of native arteries of extremities with rest pain, unspecified extremity: Secondary | ICD-10-CM

## 2013-03-15 DIAGNOSIS — I745 Embolism and thrombosis of iliac artery: Secondary | ICD-10-CM

## 2013-03-15 DIAGNOSIS — J9600 Acute respiratory failure, unspecified whether with hypoxia or hypercapnia: Secondary | ICD-10-CM

## 2013-03-15 LAB — GLUCOSE, CAPILLARY
Glucose-Capillary: 308 mg/dL — ABNORMAL HIGH (ref 70–99)
Glucose-Capillary: 159 mg/dL — ABNORMAL HIGH (ref 70–99)
Glucose-Capillary: 80 mg/dL (ref 70–99)
Glucose-Capillary: 97 mg/dL (ref 70–99)
Glucose-Capillary: 105 mg/dL — ABNORMAL HIGH (ref 70–99)
Glucose-Capillary: 136 mg/dL — ABNORMAL HIGH (ref 70–99)
Glucose-Capillary: 203 mg/dL — ABNORMAL HIGH (ref 70–99)

## 2013-03-15 LAB — CBC
HCT: 31.4 % — ABNORMAL LOW (ref 39.0–52.0)
Hemoglobin: 9.9 g/dL — ABNORMAL LOW (ref 13.0–17.0)
MCH: 26.5 pg (ref 26.0–34.0)
MCV: 84.2 fL (ref 78.0–100.0)
Platelets: 165 10*3/uL (ref 150–400)
RBC: 3.73 MIL/uL — ABNORMAL LOW (ref 4.22–5.81)
WBC: 8.8 10*3/uL (ref 4.0–10.5)

## 2013-03-15 LAB — MRSA PCR SCREENING: MRSA by PCR: NEGATIVE

## 2013-03-15 LAB — POCT I-STAT 7, (LYTES, BLD GAS, ICA,H+H)
Acid-Base Excess: 3 mmol/L — ABNORMAL HIGH (ref 0.0–2.0)
Calcium, Ion: 0.98 mmol/L — ABNORMAL LOW (ref 1.13–1.30)
HCT: 32 % — ABNORMAL LOW (ref 39.0–52.0)
O2 Saturation: 100 %
Potassium: 3.3 mEq/L — ABNORMAL LOW (ref 3.5–5.1)
Sodium: 134 mEq/L — ABNORMAL LOW (ref 135–145)
TCO2: 28 mmol/L (ref 0–100)
pO2, Arterial: 380 mmHg — ABNORMAL HIGH (ref 80.0–100.0)

## 2013-03-15 LAB — POCT I-STAT 3, ART BLOOD GAS (G3+)
Patient temperature: 97.5
TCO2: 30 mmol/L (ref 0–100)
pH, Arterial: 7.402 (ref 7.350–7.450)
pO2, Arterial: 100 mmHg (ref 80.0–100.0)

## 2013-03-15 LAB — BASIC METABOLIC PANEL
CO2: 31 mEq/L (ref 19–32)
Calcium: 8.4 mg/dL (ref 8.4–10.5)
Chloride: 100 mEq/L (ref 96–112)
Glucose, Bld: 169 mg/dL — ABNORMAL HIGH (ref 70–99)
Sodium: 140 mEq/L (ref 135–145)

## 2013-03-15 LAB — HEPARIN LEVEL (UNFRACTIONATED): Heparin Unfractionated: 0.33 IU/mL (ref 0.30–0.70)

## 2013-03-15 LAB — POCT I-STAT GLUCOSE: Operator id: 324421

## 2013-03-15 MED ORDER — SODIUM CHLORIDE 0.9 % IV SOLN
INTRAVENOUS | Status: DC
  Administered 2013-03-15: 11.1 [IU]/h via INTRAVENOUS
  Filled 2013-03-15: qty 1

## 2013-03-15 MED ORDER — IODIXANOL 320 MG/ML IV SOLN
INTRAVENOUS | Status: DC | PRN
  Administered 2013-03-15: 25 mL via INTRA_ARTERIAL

## 2013-03-15 MED ORDER — SODIUM CHLORIDE 0.9 % IV SOLN: INTRAVENOUS | Status: DC

## 2013-03-15 MED ORDER — PHENYLEPHRINE HCL 10 MG/ML IJ SOLN
30.0000 ug/min | INTRAVENOUS | Status: DC
  Administered 2013-03-15: 40 ug/min via INTRAVENOUS
  Filled 2013-03-15: qty 1

## 2013-03-15 MED ORDER — PHENOL 1.4 % MT LIQD: 1.0000 | OROMUCOSAL | Status: DC | PRN

## 2013-03-15 MED ORDER — IPRATROPIUM-ALBUTEROL 20-100 MCG/ACT IN AERS
6.0000 | INHALATION_SPRAY | RESPIRATORY_TRACT | Status: DC | PRN
  Filled 2013-03-15: qty 4

## 2013-03-15 MED ORDER — SODIUM BICARBONATE 8.4 % IV SOLN
INTRAVENOUS | Status: DC | PRN
  Administered 2013-03-15: 25 meq via INTRAVENOUS

## 2013-03-15 MED ORDER — PREDNISONE 5 MG PO TABS
5.0000 mg | ORAL_TABLET | Freq: Two times a day (BID) | ORAL | Status: DC
  Administered 2013-03-15 – 2013-03-19 (×9): 5 mg
  Filled 2013-03-15 (×12): qty 1

## 2013-03-15 MED ORDER — HEMOSTATIC AGENTS (NO CHARGE) OPTIME
TOPICAL | Status: DC | PRN
  Administered 2013-03-15: 1 via TOPICAL

## 2013-03-15 MED ORDER — SODIUM CHLORIDE 0.9 % IV SOLN
INTRAVENOUS | Status: DC
  Filled 2013-03-15: qty 1

## 2013-03-15 MED ORDER — POTASSIUM CHLORIDE CRYS ER 20 MEQ PO TBCR: 20.0000 meq | EXTENDED_RELEASE_TABLET | Freq: Once | ORAL | Status: AC | PRN

## 2013-03-15 MED ORDER — ALUM & MAG HYDROXIDE-SIMETH 200-200-20 MG/5ML PO SUSP: 15.0000 mL | ORAL | Status: DC | PRN

## 2013-03-15 MED ORDER — ACETAMINOPHEN 650 MG RE SUPP: 325.0000 mg | RECTAL | Status: DC | PRN

## 2013-03-15 MED ORDER — LABETALOL HCL 5 MG/ML IV SOLN
10.0000 mg | INTRAVENOUS | Status: DC | PRN
  Administered 2013-03-15 – 2013-03-22 (×2): 10 mg via INTRAVENOUS
  Filled 2013-03-15 (×2): qty 4

## 2013-03-15 MED ORDER — PROTAMINE SULFATE 10 MG/ML IV SOLN
INTRAVENOUS | Status: DC | PRN
  Administered 2013-03-15: 50 mg via INTRAVENOUS

## 2013-03-15 MED ORDER — INSULIN ASPART 100 UNIT/ML ~~LOC~~ SOLN: 2.0000 [IU] | SUBCUTANEOUS | Status: DC

## 2013-03-15 MED ORDER — OXYCODONE HCL 5 MG PO TABS: 5.0000 mg | ORAL_TABLET | ORAL | Status: DC | PRN

## 2013-03-15 MED ORDER — HEPARIN (PORCINE) IN NACL 100-0.45 UNIT/ML-% IJ SOLN
1800.0000 [IU]/h | INTRAMUSCULAR | Status: DC
  Administered 2013-03-15: 1550 [IU]/h via INTRAVENOUS
  Administered 2013-03-15: 1650 [IU]/h via INTRAVENOUS
  Administered 2013-03-16 – 2013-03-19 (×7): 1800 [IU]/h via INTRAVENOUS
  Filled 2013-03-15 (×14): qty 250

## 2013-03-15 MED ORDER — IPRATROPIUM-ALBUTEROL 20-100 MCG/ACT IN AERS
6.0000 | INHALATION_SPRAY | RESPIRATORY_TRACT | Status: DC
  Filled 2013-03-15: qty 4

## 2013-03-15 MED ORDER — MORPHINE SULFATE 2 MG/ML IJ SOLN: 2.0000 mg | INTRAMUSCULAR | Status: DC | PRN

## 2013-03-15 MED ORDER — POTASSIUM CHLORIDE 10 MEQ/50ML IV SOLN: 10.0000 meq | INTRAVENOUS | Status: DC

## 2013-03-15 MED ORDER — ATORVASTATIN CALCIUM 10 MG PO TABS
10.0000 mg | ORAL_TABLET | Freq: Every day | ORAL | Status: DC
  Administered 2013-03-15 – 2013-03-17 (×3): 10 mg
  Filled 2013-03-15 (×3): qty 1

## 2013-03-15 MED ORDER — SODIUM CHLORIDE 0.9 % IV SOLN: 500.0000 mL | Freq: Once | INTRAVENOUS | Status: AC | PRN

## 2013-03-15 MED ORDER — INSULIN ASPART 100 UNIT/ML ~~LOC~~ SOLN
SUBCUTANEOUS | Status: DC | PRN
  Administered 2013-03-15: 5 [IU] via INTRAVENOUS

## 2013-03-15 MED ORDER — MAGNESIUM SULFATE 40 MG/ML IJ SOLN: 2.0000 g | Freq: Once | INTRAMUSCULAR | Status: AC | PRN

## 2013-03-15 MED ORDER — DEXTROSE-NACL 5-0.45 % IV SOLN: INTRAVENOUS | Status: DC

## 2013-03-15 MED ORDER — ALBUTEROL SULFATE HFA 108 (90 BASE) MCG/ACT IN AERS
8.0000 | INHALATION_SPRAY | RESPIRATORY_TRACT | Status: DC | PRN
  Filled 2013-03-15: qty 6.7

## 2013-03-15 MED ORDER — FENTANYL CITRATE 0.05 MG/ML IJ SOLN
25.0000 ug | INTRAMUSCULAR | Status: DC | PRN
  Administered 2013-03-15 (×2): 50 ug via INTRAVENOUS
  Administered 2013-03-15: 100 ug via INTRAVENOUS
  Filled 2013-03-15 (×2): qty 2

## 2013-03-15 MED ORDER — SODIUM CHLORIDE 0.9 % IR SOLN
Status: DC | PRN
  Administered 2013-03-15

## 2013-03-15 MED ORDER — DEXTROSE 10 % IV SOLN: INTRAVENOUS | Status: DC | PRN

## 2013-03-15 MED ORDER — DEXTROSE 5 % IV SOLN
1.5000 g | Freq: Two times a day (BID) | INTRAVENOUS | Status: AC
  Administered 2013-03-15 (×2): 1.5 g via INTRAVENOUS
  Filled 2013-03-15 (×2): qty 1.5

## 2013-03-15 MED ORDER — METOPROLOL TARTRATE 1 MG/ML IV SOLN
2.0000 mg | INTRAVENOUS | Status: AC | PRN
  Administered 2013-03-21: 3 mg via INTRAVENOUS
  Administered 2013-03-21: 2 mg via INTRAVENOUS
  Filled 2013-03-15: qty 5

## 2013-03-15 MED ORDER — HYDRALAZINE HCL 20 MG/ML IJ SOLN: 10.0000 mg | INTRAMUSCULAR | Status: DC | PRN

## 2013-03-15 MED ORDER — 0.9 % SODIUM CHLORIDE (POUR BTL) OPTIME
TOPICAL | Status: DC | PRN
  Administered 2013-03-15: 3000 mL

## 2013-03-15 MED ORDER — DOPAMINE-DEXTROSE 3.2-5 MG/ML-% IV SOLN
3.0000 ug/kg/min | INTRAVENOUS | Status: DC
Start: 2013-03-15 — End: 2013-03-15

## 2013-03-15 MED ORDER — INSULIN ASPART 100 UNIT/ML ~~LOC~~ SOLN
2.0000 [IU] | SUBCUTANEOUS | Status: DC
  Administered 2013-03-15: 6 [IU] via SUBCUTANEOUS
  Administered 2013-03-15: 2 [IU] via SUBCUTANEOUS
  Administered 2013-03-16 – 2013-03-17 (×5): 4 [IU] via SUBCUTANEOUS
  Administered 2013-03-17: 6 [IU] via SUBCUTANEOUS

## 2013-03-15 MED ORDER — INSULIN GLARGINE 100 UNIT/ML ~~LOC~~ SOLN
10.0000 [IU] | SUBCUTANEOUS | Status: DC
  Administered 2013-03-15 – 2013-03-16 (×2): 10 [IU] via SUBCUTANEOUS
  Filled 2013-03-15 (×4): qty 0.1

## 2013-03-15 MED ORDER — CARVEDILOL 25 MG PO TABS
37.5000 mg | ORAL_TABLET | Freq: Two times a day (BID) | ORAL | Status: DC
  Administered 2013-03-15 – 2013-03-16 (×2): 37.5 mg
  Filled 2013-03-15 (×5): qty 1

## 2013-03-15 MED ORDER — DEXTROSE 50 % IV SOLN: 25.0000 mL | INTRAVENOUS | Status: DC | PRN

## 2013-03-15 MED ORDER — ONDANSETRON HCL 4 MG/2ML IJ SOLN: 4.0000 mg | Freq: Four times a day (QID) | INTRAMUSCULAR | Status: DC | PRN

## 2013-03-15 MED ORDER — IRBESARTAN 75 MG PO TABS
75.0000 mg | ORAL_TABLET | Freq: Every day | ORAL | Status: DC
  Filled 2013-03-15 (×2): qty 1

## 2013-03-15 MED ORDER — POTASSIUM CHLORIDE 10 MEQ/100ML IV SOLN
10.0000 meq | INTRAVENOUS | Status: AC
  Administered 2013-03-15 (×3): 10 meq via INTRAVENOUS
  Filled 2013-03-15 (×3): qty 100

## 2013-03-15 MED ORDER — ALBUTEROL SULFATE (5 MG/ML) 0.5% IN NEBU
2.5000 mg | INHALATION_SOLUTION | RESPIRATORY_TRACT | Status: DC | PRN
  Administered 2013-03-15: 2.5 mg via RESPIRATORY_TRACT
  Filled 2013-03-15 (×2): qty 0.5

## 2013-03-15 MED ORDER — CLOPIDOGREL BISULFATE 75 MG PO TABS
75.0000 mg | ORAL_TABLET | Freq: Every day | ORAL | Status: DC
  Administered 2013-03-16 – 2013-03-22 (×7): 75 mg
  Filled 2013-03-15 (×9): qty 1

## 2013-03-15 MED ORDER — PANTOPRAZOLE SODIUM 40 MG PO PACK
40.0000 mg | PACK | Freq: Two times a day (BID) | ORAL | Status: DC
Start: 2013-03-15 — End: 2013-03-16
  Filled 2013-03-15 (×2): qty 20

## 2013-03-15 MED ORDER — ACETAMINOPHEN 325 MG PO TABS: 325.0000 mg | ORAL_TABLET | ORAL | Status: DC | PRN

## 2013-03-15 MED ORDER — PANTOPRAZOLE SODIUM 40 MG PO TBEC: 40.0000 mg | DELAYED_RELEASE_TABLET | Freq: Every day | ORAL | Status: DC

## 2013-03-15 MED ORDER — DILTIAZEM HCL 100 MG IV SOLR
0.0000 mg/h | INTRAVENOUS | Status: DC
  Administered 2013-03-15: 5 mg/h via INTRAVENOUS
  Administered 2013-03-15: 7 mg/h via INTRAVENOUS
  Filled 2013-03-15 (×2): qty 100

## 2013-03-15 MED ORDER — INSULIN REGULAR BOLUS VIA INFUSION
0.0000 [IU] | Freq: Three times a day (TID) | INTRAVENOUS | Status: DC
  Filled 2013-03-15: qty 10

## 2013-03-15 MED ORDER — SODIUM CHLORIDE 0.9 % IV BOLUS (SEPSIS)
500.0000 mL | Freq: Once | INTRAVENOUS | Status: AC
  Administered 2013-03-15: 500 mL via INTRAVENOUS

## 2013-03-15 MED ORDER — SODIUM CHLORIDE 0.9 % IJ SOLN
INTRAVENOUS | Status: DC | PRN
  Administered 2013-03-15: via INTRAMUSCULAR

## 2013-03-15 MED ORDER — DOCUSATE SODIUM 100 MG PO CAPS
100.0000 mg | ORAL_CAPSULE | Freq: Every day | ORAL | Status: DC
  Administered 2013-03-16: 100 mg via ORAL
  Filled 2013-03-15: qty 1

## 2013-03-15 MED ORDER — MIDAZOLAM HCL 2 MG/2ML IJ SOLN
1.0000 mg | INTRAMUSCULAR | Status: DC | PRN
  Administered 2013-03-15: 2 mg via INTRAVENOUS
  Filled 2013-03-15: qty 2

## 2013-03-15 MED ORDER — EZETIMIBE 10 MG PO TABS
10.0000 mg | ORAL_TABLET | Freq: Every day | ORAL | Status: DC
  Administered 2013-03-16 – 2013-03-21 (×5): 10 mg
  Filled 2013-03-15 (×8): qty 1

## 2013-03-15 MED ORDER — GUAIFENESIN-DM 100-10 MG/5ML PO SYRP: 15.0000 mL | ORAL_SOLUTION | ORAL | Status: DC | PRN

## 2013-03-15 NOTE — Progress Notes (Signed)
OT Cancellation Note  Patient Details Name: [name] [name] MRN: [id number] DOB: 12/06/1950   Cancelled Treatment:    Reason Eval/Treat Not Completed: Patient not medically ready. Pt still intubated. Plans for extubation later today per RN report. Will check back next date to see if pt is medically appropriate.   03/15/2013 Cipriano Mile OTR/L Pager [phone] Office [phone]

## 2013-03-15 NOTE — Progress Notes (Signed)
ANTICOAGULATION CONSULT NOTE   Pharmacy Consult for heparin Indication: atrial fibrillation  Allergies  Allergen Reactions  . Lodine [Etodolac] Nausea And Vomiting  . Methotrexate Derivatives Nausea And Vomiting  . Ranitidine Hcl Nausea Only  . Remicade [Infliximab] Other (See Comments)    Chills and shakes     Patient Measurements: Height: 5\' 9"  (175.3 cm) Weight: 216 lb 14.9 oz (98.4 kg) IBW/kg (Calculated) : 70.7 Heparin Dosing Weight: 82 kg  Vital Signs: Temp: 97.7 F (36.5 C) (12/17 1553) Temp src: Oral (12/17 1553) BP: 161/73 mmHg (12/17 1700) Pulse Rate: 120 (12/17 1700)  Labs:  Recent Labs  03/13/13 0718 03/13/13 1110 03/13/13 2115 03/13/13 2327 03/14/13 0820 03/14/13 2305 03/15/13 0137 03/15/13 0900 03/15/13 1735  HGB 12.2*  --  10.7*  --  11.6* 11.9* 10.9* 9.9*  --   HCT 36.0*  --  34.7*  --  37.2* 35.0* 32.0* 31.4*  --   PLT  --   --  186  --  181  --   --  165  --   APTT  --  24  --   --   --   --   --   --   --   LABPROT  --  13.6  --   --   --   --   --   --   --   INR  --  1.06  --   --   --   --   --   --   --   HEPARINUNFRC  --   --   --  0.13* 0.65  --   --   --  0.33  CREATININE 0.90 0.68  --   --  0.68  --   --  0.92  --     Estimated Creatinine Clearance: 98.8 ml/min (by C-G formula based on Cr of 0.92).  Assessment: [name] [name] with AFib, s/p femoral endarterectomy, continues on heparin.  Noted received total of 10,000 units IV heparin  boluses while in OR, followed by 50 mg protamine. Was restarted on pre-procedure heparin rate of 1550 units/hr. Heparin level after restart was therapeutic at 0.33units/mL, but is on the low end of goal. No bleeding noted. CBC has dropped slightly post-op.  Goal of Therapy:  Heparin level 0.3-0.7 units/ml Monitor platelets by anticoagulation protocol: Yes   Plan:  1. Increase heparin to 1650 units/hr 2. Check heparin level with AM labs 3. Daily HL and CBC 4. Follow for long-term anticoagulation plans, s/s  bleeding  Mansel Strother D. Takhia Spoon, PharmD, BCPS Clinical Pharmacist Pager: [phone] 03/15/2013 6:23 PM

## 2013-03-15 NOTE — Progress Notes (Signed)
62 yo WM admitted with afib and RVR, acute on chronic systolic CHF. EF 30-35%. History of prior CABG, remote MI, and ICD. Recent resting claudication with nonhealing ulcer right ankle. On 03/14/13 pm patient developed a cold right leg and was taken emergently to the OR for revascularization.  TELEMETRY: Reviewed telemetry pt in atrial fibrillation with controlled response on IV diltiazem   Filed Vitals:   03/15/13 0630 03/15/13 0645 03/15/13 0700 03/15/13 0808  BP:  100/67 115/56   Pulse: 44 59 45   Temp:    97.5 F (36.4 C)  TempSrc:    Oral  Resp: 14 13 13    Height:      Weight:      SpO2: 100% 100% 100%     Intake/Output Summary (Last 24 hours) at 03/15/13 0817 Last data filed at 03/15/13 0700  Gross per 24 hour  Intake 3672.16 ml  Output   3305 ml  Net 367.16 ml    SUBJECTIVE Patient sedated on vent but responsive.  LABS: Basic Metabolic Panel:  Recent Labs  16/10/96 1110 03/14/13 0820 03/14/13 2305 03/15/13 0059 03/15/13 0137 03/15/13 0203  NA 139 140 134*  --  134*  --   K 3.6 3.5 5.0  --  3.3*  --   CL 101 100  --   --   --   --   CO2 26 28  --   --   --   --   GLUCOSE 203* 199*  --  430*  --  374*  BUN 13 12  --   --   --   --   CREATININE 0.68 0.68  --   --   --   --   CALCIUM 9.8 9.4  --   --   --   --    Liver Function Tests:  Recent Labs  03/13/13 1110  AST 8  ALT 16  ALKPHOS 67  BILITOT 0.3  PROT 6.4  ALBUMIN 3.2*   No results found for this basename: LIPASE, AMYLASE,  in the last 72 hours CBC:  Recent Labs  03/13/13 2115 03/14/13 0820 03/14/13 2305 03/15/13 0137  WBC 9.6 9.4  --   --   HGB 10.7* 11.6* 11.9* 10.9*  HCT 34.7* 37.2* 35.0* 32.0*  MCV 85.0 85.1  --   --   PLT 186 181  --   --    BNP:  Hemoglobin A1C:  Recent Labs  03/13/13 1110  HGBA1C 9.0*   Thyroid Function Tests:  Recent Labs  03/13/13 1110  TSH 0.744    Radiology/Studies:  Dg Chest Port 1 View  03/15/2013   CLINICAL DATA:  Post right lower  extremity vascular surgery  EXAM: PORTABLE CHEST - 1 VIEW  COMPARISON:  03/13/2013; 11/09/2006  FINDINGS: Grossly unchanged enlarged cardiac silhouette and mediastinal contours given a reduced lung volumes and patient rotation. Atherosclerotic plaque within the thoracic aorta. Post median sternotomy. Grossly stable position of left anterior chest wall single lead AICD/pacemaker. Interval intubation with endotracheal tube overlying the tracheal air column with tip approximately 2.5 cm above the carina. Enteric tube tip and side port projects below the left hemidiaphragm. Chronic pulmonary venous congestion without frank evidence of edema. Grossly unchanged left basilar opacities, likely atelectasis. No definite pleural effusion or pneumothorax. Unchanged bones.  IMPRESSION: 1. Appropriately positioned support apparatus as above. No pneumothorax. 2. Cardiomegaly and pulmonary venous congestion without frank evidence of edema. 3. Unchanged left basilar atelectasis/scar.   Electronically Signed  By: Simonne Come M.D.   On: 03/15/2013 08:14   Dg Chest Port 1 View  03/13/2013   CLINICAL DATA:  CHF.  EXAM: PORTABLE CHEST - 1 VIEW  COMPARISON:  11/09/2006.  FINDINGS: Pacemaker enters from the left with leads in the region of the right ventricle. Battery pack limits evaluation of the left upper lung zone.  Post CABG.  Cardiomegaly.  Remote rib fractures.  No gross pneumothorax.  No segmental consolidation or pulmonary edema.  The patient would eventually benefit from two view chest.  IMPRESSION: Pacemaker enters from the left with leads in the region of the right ventricle. Battery pack limits evaluation of the left upper lung zone.  Post CABG.  Cardiomegaly.  No segmental consolidation or pulmonary edema.   Electronically Signed   By: Bridgett Larsson M.D.   On: 03/13/2013 16:42   ECG 12/16 atrial flutter with slow ventricular response. RBBB, old septal MI. ST changes c/w lateral ischemia.  PHYSICAL EXAM General:  Obese, well nourished, in no acute distress. On vent. Head: Normocephalic, atraumatic, sclera non-icteric, no xanthomas, nares are without discharge. Neck: Negative for carotid bruits. JVD 8 cm. Lungs: Clear anteriorly Heart: IRRR S1 S2 without murmurs, rubs, or gallops.  Abdomen: Soft, non-tender, obese. No hepatomegaly. No rebound/guarding. No obvious abdominal masses. Extremities: 1+ edema.  Pedal pulses are nonpalpable but feet are warm and dry. Ulcer on lateral aspect of right heel. Neuro: Sedated on vent.  ASSESSMENT AND PLAN: 1. Atrial fibrillation with RVR. Controlled on IV diltiazem now. Would resume Coreg once able to take pos. Recommend resuming heparin when OK with vascular surgery given high [place] score. Given acute ischemic event of RLE would probably advise  2. Acute on chronic systolic CHF. Recommend continued diruesis with lasix. 3. Acutely ischemic right leg due to advanced atherosclerosis. S/p right common femoral, produndofemoral, and superficial femoral endarterectomy with stenting of the right common and external iliac arteries. Needs to continue antiplatelet therapy per vascular surgery but will need to be anticoagulated for Afib as well. When [facility] with surgery would resume heparin and transition to coumadin. 4. CAD s/p CABG 5. DM type 2   Principal Problem:   Atrial fibrillation Active Problems:   Embolism and thrombosis of iliac artery   Peripheral vascular disease, unspecified   Acute on chronic systolic CHF (congestive heart failure)   Diabetes mellitus type 2 with peripheral artery disease   CAD (coronary artery disease)   S/P CABG x 5   Atrial fibrillation, rapid   Bradycardia   Hypotension, unspecified   Acute respiratory failure    Signed, Peter [place] MD,FACC 03/15/2013 8:33 AM

## 2013-03-15 NOTE — Evaluation (Signed)
Physical Therapy Evaluation Patient Details Name: [name] [name] MRN: [id number] DOB: [birth year] Today's Date: 03/15/2013 Time: 7829-5621 PT Time Calculation (min): 13 min  PT Assessment / Plan / Recommendation History of Present Illness  76 M with extensive cardiac and PVD history presented 12/15 for scheduled arteriogram of BLEs and was found to be in AFRVR. Admitted by Cardiology for mgmt with plan for arteriogram to be performed later in week. Developed acute arterial occlusion of RLE and underwent emergent R common femoral, profundofemoral, and superficial femoral endarterectomy with patch angioplasty. Remained intubated post op and PCCM asked to admit   Clinical Impression  Pt admitted with above. Pt currently with functional limitations due to the deficits listed below (see PT Problem List).  Pt will benefit from skilled PT to increase their independence and safety with mobility to allow discharge to the venue listed below.     PT Assessment  Patient needs continued PT services    Follow Up Recommendations  Home health PT;Supervision/Assistance - 24 hour                Equipment Recommendations  None recommended by PT         Frequency Min 3X/week    Precautions / Restrictions Precautions Precautions: Fall Restrictions Weight Bearing Restrictions: No   Pertinent Vitals/Pain 111-139 bpm HR; right groin burning once pt sat up EOB.  Had meds      Mobility  Bed Mobility Bed Mobility: Rolling Left;Left Sidelying to Sit;Sitting - Scoot to Edge of Bed Rolling Left: 1: +2 Total assist;With rail Rolling Left: Patient Percentage: 50% Left Sidelying to Sit: 1: +2 Total assist;HOB elevated;With rails Left Sidelying to Sit: Patient Percentage: 50% Sitting - Scoot to Edge of Bed: 3: Mod assist Details for Bed Mobility Assistance: Pt needed assist for rolling for elevation of trunk and to bring LEs off bed.  Pt immediately c/o burning in right groin and wanted to stand up  right away.  Needed support for postural stability due to pain in right groin.   Transfers Transfers: Sit to Stand;Stand to Sit;Stand Pivot Transfers Sit to Stand: 1: +2 Total assist;With upper extremity assist;From bed Sit to Stand: Patient Percentage: 60% Stand to Sit: 1: +2 Total assist;With upper extremity assist;With armrests;To chair/3-in-1 Stand to Sit: Patient Percentage: 60% Stand Pivot Transfers: 1: +2 Total assist Stand Pivot Transfers: Patient Percentage: 70% Details for Transfer Assistance: Pt needed cues for hand placement and assist for power up.  Pt able to take pivotal steps around to chair with some assist for weight shifting through pts elbows.   Ambulation/Gait Ambulation/Gait Assistance: Not tested (comment) Stairs: No Wheelchair Mobility Wheelchair Mobility: No         PT Diagnosis: Generalized weakness  PT Problem List: Decreased activity tolerance;Decreased balance;Decreased mobility;Decreased knowledge of use of DME;Decreased safety awareness;Decreased knowledge of precautions PT Treatment Interventions: Gait training;DME instruction;Functional mobility training;Therapeutic activities;Therapeutic exercise;Patient/family education;Balance training     PT Goals(Current goals can be found in the care plan section) Acute Rehab PT Goals Patient Stated Goal: to go home PT Goal Formulation: With patient Time For Goal Achievement: 03/22/13 Potential to Achieve Goals: Good  Visit Information  Last PT Received On: 03/15/13 Assistance Needed: +2 History of Present Illness:  89 M with extensive cardiac and PVD history presented 12/15 for scheduled arteriogram of BLEs and was found to be in AFRVR. Admitted by Cardiology for mgmt with plan for arteriogram to be performed later in week. Developed acute arterial occlusion of RLE and  underwent emergent R common femoral, profundofemoral, and superficial femoral endarterectomy with patch angioplasty. Remained intubated post  op and PCCM asked to admit         Prior Functioning  Home Living Family/patient expects to be discharged to:: Private residence Living Arrangements: Spouse/significant other Available Help at Discharge: Family;Available 24 hours/day Type of Home: House Home Access: Stairs to enter [employer] of Steps: 2 Entrance Stairs-Rails: Can reach both Home Layout: One level Home Equipment: Walker - 2 wheels;Cane - single point;Shower seat - built in;Wheelchair - manual Prior Function Level of Independence: Independent Communication Communication: No difficulties Dominant Hand: Right    Cognition  Cognition Arousal/Alertness: Awake/alert Behavior During Therapy: WFL for tasks assessed/performed Overall Cognitive Status: Within Functional Limits for tasks assessed    Extremity/Trunk Assessment Upper Extremity Assessment Upper Extremity Assessment: Defer to OT evaluation Lower Extremity Assessment Lower Extremity Assessment: Generalized weakness   Balance Balance Balance Assessed: Yes Static Standing Balance Static Standing - Balance Support: Bilateral upper extremity supported;During functional activity Static Standing - Level of Assistance: 5: Stand by assistance;4: Min assist Static Standing - Comment/# of Minutes: 2 min with steadying assist.  End of Session PT - End of Session Equipment Utilized During Treatment: Gait belt;Oxygen Activity Tolerance: Patient limited by fatigue Patient left: in chair;with call bell/phone within reach Nurse Communication: Mobility status       INGOLD,Michelle Wnek 03/15/2013, 4:09 PM [facility] Acute [facility] [phone] (pager)

## 2013-03-15 NOTE — Anesthesia Postprocedure Evaluation (Signed)
  Anesthesia Post-op Note  Patient: [name] [name]  Procedure(s) Performed: Procedure(s): PATCH ANGIOPLASTY Right Common Femoral, Superficial Femoral and Profunda-Femoral (Right) ABDOMINAL AORTAGRAM with right leg runoff.  Catheter in aorta times one (N/A) INSERTION OF ILIAC STENT Right Common and External  (Right)  Patient Location: ICU  Anesthesia Type:General  Level of Consciousness: sedated  Airway and Oxygen Therapy: Patient remains intubated per anesthesia plan and Patient placed on Ventilator (see vital sign flow sheet for setting)  Post-op Pain: none  Post-op Assessment: Post-op Vital signs reviewed, Patient's Cardiovascular Status Stable, Respiratory Function Stable, Patent Airway, No signs of Nausea or vomiting and Pain level controlled  Post-op Vital Signs: Reviewed and stable  Complications: No apparent anesthesia complications

## 2013-03-15 NOTE — Progress Notes (Signed)
Spoke to RN regarding patient.  Note that patient is currently on IV insulin per ICU Glycemic Control Protocol-Phase 2.  Placed order for Phase 2 of ICU Protocol per sidebar and discontinued Phase 1 since patient is on IV insulin per protocol.  Referred RN to sidebar for transition guidelines.   Thanks, Beryl Meager, RN, BC-ADM Inpatient Diabetes Coordinator Pager [phone]

## 2013-03-15 NOTE — Progress Notes (Addendum)
INITIAL NUTRITION ASSESSMENT  DOCUMENTATION CODES Per approved criteria  -Obesity Unspecified   INTERVENTION:  If EN started, recommend initiation of Vital HP formula at goal rate of 20 ml/hr with Prostat liquid protein 60 ml 4 times daily via tube to provide 1280 kcals (72% of estimated kcal needs), 163 gm protein (100% of estimated protein needs), 401 ml of free water  Liquid MVI daily via tube RD to follow for nutrition care plan  NUTRITION DIAGNOSIS: Inadequate oral intake related to inability to eat as evidenced by NPO status  Goal: Initiate EN support within next 24-48 hours if prolonged intubation expected  Monitor:  EN initiation & tolerance, respiratory status, weight, labs, I/O's  Reason for Assessment: Consult  62 y.o. male  Admitting Dx: Atrial fibrillation  ASSESSMENT: Patient with extensive cardiac and PVD history presented for scheduled arteriogram of BLEs and was found to be in AF RVR; developed acute arterial occlusion of RLE and underwent emergent surgery.  Patient s/p procedures 12/ RIGHT COMMON FEMORAL, PROFUNDOFEMORAL AND SUPERFICIAL FEMORAL ENDARTERECTOMY  PATCH ANGIOPLASTY  Patient is currently intubated on ventilator support -- OGT in place MV:  6.9 L/min Temp (24hrs), Avg:97.7 F (36.5 C), Min:97.2 F (36.2 C), Max:98.2 F (36.8 C)   RD consulted for EN initiation & management -- discontinued 1017.  Height: Ht Readings from Last 1 Encounters:  03/13/13 5\' 9"  (1.753 m)    Weight: Wt Readings from Last 1 Encounters:  03/15/13 216 lb 14.9 oz (98.4 kg)    Ideal Body Weight: 160 lb  % Ideal Body Weight: 203%  Wt Readings from Last 10 Encounters:  03/15/13 216 lb 14.9 oz (98.4 kg)  03/15/13 216 lb 14.9 oz (98.4 kg)  03/15/13 216 lb 14.9 oz (98.4 kg)  03/08/13 216 lb (97.977 kg)  09/07/12 206 lb (93.441 kg)  03/09/12 216 lb (97.977 kg)  09/03/11 220 lb 4.8 oz (99.927 kg)  03/04/11 222 lb (100.699 kg)    Usual Body Weight: 216  lb  % Usual Body Weight: 100%  BMI:  Body mass index is 32.02 kg/(m^2).  Estimated Nutritional Needs: Kcal: 1775 Protein: 155-165 gm Fluid: per MD  Skin: groin surgical incision   Diet Order: NPO  EDUCATION NEEDS: -No education needs identified at this time   Intake/Output Summary (Last 24 hours) at 03/15/13 1019 Last data filed at 03/15/13 0809  Gross per 24 hour  Intake 3527.94 ml  Output   2600 ml  Net 927.94 ml    Labs:   Recent Labs Lab 03/13/13 1110 03/14/13 0820 03/14/13 2305 03/15/13 0059 03/15/13 0137 03/15/13 0203 03/15/13 0900  NA 139 140 134*  --  134*  --  140  K 3.6 3.5 5.0  --  3.3*  --  3.3*  CL 101 100  --   --   --   --  100  CO2 26 28  --   --   --   --  31  BUN 13 12  --   --   --   --  18  CREATININE 0.68 0.68  --   --   --   --  0.92  CALCIUM 9.8 9.4  --   --   --   --  8.4  GLUCOSE 203* 199*  --  430*  --  374* 169*    CBG (last 3)   Recent Labs  03/15/13 0554 03/15/13 0656 03/15/13 0805  GLUCAP 222* 229* 190*    Scheduled Meds: . aspirin EC  81 mg Oral Daily  . atorvastatin  10 mg Per Tube q1800  . calcium-vitamin D  2 tablet Oral Q breakfast  . carvedilol  37.5 mg Per Tube BID WC  . cefUROXime (ZINACEF)  IV  1.5 g Intravenous Q12H  . clobetasol cream  1 application Topical BID  . clopidogrel  75 mg Per Tube Q breakfast  . collagenase   Topical Daily  . [START ON 03/16/2013] docusate sodium  100 mg Oral Daily  . ezetimibe  10 mg Per Tube Daily  . ferrous gluconate  324 mg Oral Q breakfast  . gabapentin  1,200 mg Oral QHS  . gabapentin  600 mg Oral Daily  . glyBURIDE  10 mg Oral Q breakfast  . hydrALAZINE  25 mg Oral BID  . insulin aspart  2-6 Units Subcutaneous Q4H  . irbesartan  75 mg Per Tube Daily  . isosorbide mononitrate  60 mg Oral Daily  . leflunomide  20 mg Oral Daily  . linagliptin  5 mg Oral QAC supper  . omega-3 acid ethyl esters  2 g Oral Daily  . pantoprazole sodium  40 mg Per Tube BID  . potassium  chloride  10 mEq Intravenous Q1 Hr x 3  . potassium chloride  40 mEq Oral Daily  . predniSONE  5 mg Per Tube q12n4p  . sodium chloride  3 mL Intravenous Q12H    Continuous Infusions: . diltiazem (CARDIZEM) infusion 5 mg/hr (03/15/13 1018)  . DOPamine Stopped (03/15/13 0945)  . heparin 1,550 Units/hr (03/15/13 0905)  . insulin (NOVOLIN-R) infusion 3.7 Units/hr (03/15/13 1017)    Past Medical History  Diagnosis Date  . Diabetes mellitus   . Hypertension   . Hyperlipidemia   . Rheumatoid arthritis   . CAD (coronary artery disease)   . Myocardial infarction 2000 & 2006  . GERD (gastroesophageal reflux disease)   . Leg pain   . CHF (congestive heart failure)   . Chronic kidney disease   . PAD (peripheral artery disease)   . Decubitus ulcer of right ankle   . 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 Jude - single chamber ICD  . Pacemaker     Past Surgical History  Procedure Laterality Date  . Hernia repair    . Coronary artery bypass graft    . Carpal tunnel release    . Knee surgery    . Cardiac defibrillator placement  04/08/2012    St. Jude Device - single chamber ICD  . Difibulator      Maureen Chatters, RD, LDN Pager #: [phone] After-Hours Pager #: [phone]

## 2013-03-15 NOTE — Consult Note (Signed)
PULMONARY  / CRITICAL CARE MEDICINE  Name: [name] [name] MRN: [id number] DOB: 1950-05-25    ADMISSION DATE:  03/13/2013 CONSULTATION DATE:  12/17    REFERRING MD :  Myra Gianotti PRIMARY SERVICE: VVS  CHIEF COMPLAINT:  Ventilator management  BRIEF PATIENT DESCRIPTION:  49 M with extensive cardiac and PVD history presented 12/15 for scheduled arteriogram of BLEs and was found to be in [facility]. Admitted by Cardiology for mgmt with plan for arteriogram to be performed later in week. Developed acute arterial occlusion of RLE and underwent emergent R common femoral, profundofemoral, and superficial femoral endarterectomy with patch angioplasty. Remained intubated post op and PCCM asked to admit   SIGNIFICANT EVENTS / STUDIES:  12/15 adm with AFRVR  12/16 Acute RLE ischemia. Emergent right common femoral, profundofemoral, and superficial femoral endarterectomy with patch angioplasty ([place])   LINES / TUBES: ETT 12/16 >>  R radial A-line 12/16 >>   CULTURES: MRSA PCR 12/15 >> NEG  ANTIBIOTICS: Cefuroxime (peri-op) 12/16 >> 12/17    HISTORY OF PRESENT ILLNESS:  Pt is unable to provide history due to intubated status. Admission H&P, consultant notes and operative note reviewed.   PAST MEDICAL HISTORY :  Past Medical History  Diagnosis Date  . Diabetes mellitus   . Hypertension   . Hyperlipidemia   . Rheumatoid arthritis   . CAD (coronary artery disease)   . Myocardial infarction 2000 & 2006  . GERD (gastroesophageal reflux disease)   . Leg pain   . CHF (congestive heart failure)   . Chronic kidney disease   . PAD (peripheral artery disease)   . Decubitus ulcer of right ankle   . 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 Jude - single chamber ICD  . Pacemaker    Past Surgical History  Procedure Laterality Date  . Hernia repair    . Coronary artery bypass graft    . Carpal tunnel release    . Knee surgery    . Cardiac defibrillator  placement  04/08/2012    St. Jude Device - single chamber ICD  . Difibulator     Prior to Admission medications   Medication Sig Start Date End Date Taking? Authorizing Provider  Abatacept (ORENCIA [place]) Inject into the skin once a week.   Yes Historical Provider, MD  aspirin EC 81 MG tablet Take 81 mg by mouth daily.     Yes Historical Provider, MD  Calcium Carb-Cholecalciferol (CALCIUM 1000 + D PO) Take 1 tablet by mouth daily.     Yes Historical Provider, MD  calcium carbonate (TUMS - DOSED IN MG ELEMENTAL CALCIUM) 500 MG chewable tablet Chew 1 tablet by mouth as needed for indigestion.    Yes Historical Provider, MD  carvedilol (COREG) 25 MG tablet Take 1 tablet by mouth 2 (two) times daily. 08/31/12  Yes Historical Provider, MD  clobetasol cream (TEMOVATE) 0.05 % Apply 1 application topically 2 (two) times daily.   Yes Historical Provider, MD  clopidogrel (PLAVIX) 75 MG tablet Take 75 mg by mouth daily.     Yes Historical Provider, MD  ezetimibe (ZETIA) 10 MG tablet Take 10 mg by mouth daily.     Yes Historical Provider, MD  ferrous gluconate (FERGON) 325 MG tablet Take 325 mg by mouth daily with breakfast.     Yes Historical Provider, MD  fish oil-omega-3 fatty acids 1000 MG capsule Take 2 g by mouth daily.     Yes Historical Provider, MD  gabapentin (NEURONTIN) 600 MG  tablet Take 1-2 tablets by mouth 2 (two) times daily. 600 mg in am and 1200 mg at night. 08/24/12  Yes Historical Provider, MD  glyBURIDE-metformin (GLUCOVANCE) 5-500 MG per tablet Take 1-2 tablets by mouth 2 (two) times daily with a meal. Takes 2 in the am and 1 tab in the pm.   Yes Historical Provider, MD  hydrALAZINE (APRESOLINE) 25 MG tablet Take 1 tablet by mouth 2 (two) times daily. 08/31/12  Yes Historical Provider, MD  HYDROcodone-acetaminophen (NORCO) 10-325 MG per tablet Take 1 tablet by mouth 5 (five) times daily.   Yes Historical Provider, MD  Ibuprofen (ADVIL PO) Take 600 mg elemental calcium/kg/hr by mouth 2 (two)  times daily.    Yes Historical Provider, MD  isosorbide mononitrate (IMDUR) 60 MG 24 hr tablet Take 1 tablet by mouth daily.  08/31/12  Yes Historical Provider, MD  leflunomide (ARAVA) 20 MG tablet Take 1 tablet by mouth daily. 02/10/12  Yes Historical Provider, MD  pantoprazole (PROTONIX) 40 MG tablet Take 80 mg by mouth 2 (two) times daily.    Yes Historical Provider, MD  predniSONE (DELTASONE) 5 MG tablet Take 5 mg by mouth 2 (two) times daily.  08/28/11  Yes Historical Provider, MD  sitaGLIPtin (JANUVIA) 100 MG tablet Take 100 mg by mouth daily.     Yes Historical Provider, MD  valsartan-hydrochlorothiazide (DIOVAN-HCT) 80-12.5 MG per tablet Take 1 tablet by mouth daily. 03/05/13  Yes Historical Provider, MD   Allergies  Allergen Reactions  . Lodine [Etodolac] Nausea And Vomiting  . Methotrexate Derivatives Nausea And Vomiting  . Ranitidine Hcl Nausea Only  . Remicade [Infliximab] Other (See Comments)    Chills and shakes     FAMILY HISTORY:  Family History  Problem Relation Age of Onset  . Hyperlipidemia Mother   . Diabetes Father   . Hypertension Father   . Heart disease Father   . Heart attack Father    SOCIAL HISTORY:  reports that he quit smoking about 15 years ago. His smoking use included Cigarettes. He smoked 0.00 packs per day. He does not have any smokeless tobacco history on file. He reports that he drinks about 0.6 ounces of alcohol per week. He reports that he does not use illicit drugs.  REVIEW OF SYSTEMS:  N?A  SUBJECTIVE:   VITAL SIGNS: Temp:  [97.7 F (36.5 C)-98.2 F (36.8 C)] 97.8 F (36.6 C) (12/16 1600) Pulse Rate:  [64-96] 80 (12/17 0315) Resp:  [13-24] 14 (12/17 0315) BP: (60-160)/(31-88) 73/48 mmHg (12/16 2200) SpO2:  [89 %-100 %] 99 % (12/17 0315) FiO2 (%):  [40 %] 40 % (12/17 0315) Weight:  [94.1 kg (207 lb 7.3 oz)] 94.1 kg (207 lb 7.3 oz) (12/16 0444) HEMODYNAMICS:   VENTILATOR SETTINGS: Vent Mode:  [-] PRVC FiO2 (%):  [40 %] 40 % Set Rate:   [14 bmp] 14 bmp Vt Set:  [550 mL] 550 mL PEEP:  [5 cmH20] 5 cmH20 Plateau Pressure:  [21 cmH20] 21 cmH20 INTAKE / OUTPUT: Intake/Output     12/16 0701 - 12/17 0700   P.O. 925   I.V. (mL/kg) 2196.7 (23.3)   IV Piggyback 500   Total Intake(mL/kg) 3621.7 (38.5)   Urine (mL/kg/hr) 2720 (1.2)   Blood 400 (0.2)   Total Output 3120   Net +501.7       Urine Occurrence 1 x   Stool Occurrence 2 x     PHYSICAL EXAMINATION: General:  Chronically ill appearing male, sedated and intubated. Neuro:  Sedated  and intubated, withdraws all ext to pain but not command. HEENT:  [place]/AT, PERRL, EOM-I and MMM. Cardiovascular:  RRR, Nl S1/S2, -M/R/G. Lungs:  Diffuse end exp wheezes. Abdomen:  Soft, NT, ND and hypoactive BS. Musculoskeletal:  No pulses on RLE and week on left, cold to palpation. Skin:  Surgical wound noted.  LABS:  CBC  Recent Labs Lab 03/13/13 0718 03/13/13 2115 03/14/13 0820 03/14/13 2305 03/15/13 0137  WBC  --  9.6 9.4  --   --   HGB 12.2* 10.7* 11.6* 11.9* 10.9*  HCT 36.0* 34.7* 37.2* 35.0* 32.0*  PLT  --  186 181  --   --    Coag's  Recent Labs Lab 03/13/13 1110  APTT 24  INR 1.06   BMET  Recent Labs Lab 03/13/13 0718 03/13/13 1110 03/14/13 0820 03/14/13 2305 03/15/13 0059 03/15/13 0137 03/15/13 0203  NA 138 139 140 134*  --  134*  --   K 3.3* 3.6 3.5 5.0  --  3.3*  --   CL 101 101 100  --   --   --   --   CO2  --  26 28  --   --   --   --   BUN 15 13 12   --   --   --   --   CREATININE 0.90 0.68 0.68  --   --   --   --   GLUCOSE 262* 203* 199*  --  430*  --  374*   Electrolytes  Recent Labs Lab 03/13/13 1110 03/14/13 0820  CALCIUM 9.8 9.4   Sepsis Markers No results found for this basename: LATICACIDVEN, PROCALCITON, O2SATVEN,  in the last 168 hours ABG  Recent Labs Lab 03/14/13 2305 03/15/13 0137  PHART 7.331* 7.458*  PCO2ART 55.7* 38.4  PO2ART 262.0* 380.0*   Liver Enzymes  Recent Labs Lab 03/13/13 1110  AST 8  ALT 16   ALKPHOS 67  BILITOT 0.3  ALBUMIN 3.2*   Cardiac Enzymes  Recent Labs Lab 03/14/13 0820  PROBNP 4169.0*   Glucose  Recent Labs Lab 03/13/13 1636 03/13/13 2146 03/14/13 0804 03/14/13 1232 03/14/13 1647 03/14/13 2023  GLUCAP 293* 336* 202* 234* 268* 380*      CXR: Pending, pre-op noted.  ASSESSMENT / PLAN:  PULMONARY A: Ventilator status P:   Vent settings established - asynchronous with vent, changed to PCV with ABG to follow. Vent bundle implemented Daily SBT beginning 12/17 AM CXR and ABG to follow. Per RN staff potentially going back to the OR later today so will not extubate. Combivent scheduled. Albuterol PRN (if tachy then change to xopenex).  CARDIOVASCULAR A:  CAD, s/p CABG (2006) Chronic CHF AFRVR Severe PVD Acute RLE arterial occlusion - s/p emergent revasc 12/16 P:  Cards following VVS managing post op issues Changed PO diltiazem to continuous infusion while intubated Titrate to maintain HR < 110/min  RENAL A:   Mild hyponatremia Mild hypokalemia P:   Monitor BMET intermittently Correct electrolytes as indicated BMET today as unsure if was replaced pre-op.  GASTROINTESTINAL A:   GERD, chronic Chronic PPI use P:   SUP: IV PPI  Consult nutrition for TF.  HEMATOLOGIC A:  Mild anemia without overt blood loss P:  DVT px: full heparin (for AF) Monitor CBC intermittently Transfuse for hemodynamically significant bleeding or Hgb < 7.0  INFECTIOUS A:   No issues P:   Micro and abx as above  ENDOCRINE A:   IDDM - uncontrolled Chronic prednisone (  for RA) P:   Insulin gtt post op Transition to SQ insulin when able  NEUROLOGIC/Rheum A:   Rheumatoid Arthritis Post op pain ICU/ventilator associated discomfort P:   PRN fentanyl PRN midaz  I have personally obtained a history, examined the patient, evaluated laboratory and imaging results, formulated the assessment and plan and placed orders.  CRITICAL CARE: The  patient is critically ill with multiple organ systems failure and requires high complexity decision making for assessment and support, frequent evaluation and titration of therapies, application of advanced monitoring technologies and extensive interpretation of multiple databases. Critical Care Time devoted to patient care services described in this note is 40 minutes.   Alyson Reedy, M.D. Pulmonary and Critical Care Medicine [facility] Pager: [phone]  03/15/2013, 3:51 AM

## 2013-03-15 NOTE — Transfer of Care (Signed)
Immediate Anesthesia Transfer of Care Note  Patient: [name] [name]  Procedure(s) Performed: Procedure(s): PATCH ANGIOPLASTY Right Common Femoral, Superficial Femoral and Profunda-Femoral (Right) ABDOMINAL AORTAGRAM with right leg runoff.  Catheter in aorta times one (N/A) INSERTION OF ILIAC STENT Right Common and External  (Right)  Patient Location: SICU  Anesthesia Type:General  Level of Consciousness: Patient remains intubated per anesthesia plan  Airway & Oxygen Therapy: Patient remains intubated per anesthesia plan and Patient placed on Ventilator (see vital sign flow sheet for setting)  Post-op Assessment: Report given to PACU RN and Post -op Vital signs reviewed and stable  Post vital signs: Reviewed and stable  Complications: No apparent anesthesia complications

## 2013-03-15 NOTE — Op Note (Signed)
Patient name: [name] [name] MRN: [id number] DOB: Feb 01, 1951 Sex: male  03/13/2013 - 03/15/2013 Pre-operative Diagnosis: Ischemic right leg Post-operative diagnosis:  Same Surgeon:  Jorge Ny Assistants:  Tilden Fossa Procedure:   #1: Right common femoral, profundofemoral, and superficial femoral endarterectomy with patch angioplasty   #2: Abdominal aortogram   #3: Right lower extremity runoff   #4: Stent, right common iliac artery   #5: Stent, right external iliac artery Anesthesia:  Gen. Blood Loss:  See anesthesia record Specimens:  None  Findings:  I could not identify any acute thrombotic event or embolic event.  The patient had a high-grade stenosis within his proximal common femoral artery which was nearly occlusive.  He had a tandem lesion within his proximal external iliac artery.  I performed an endarterectomy of the common femoral profunda femoral and superficial femoral artery and stented the majority of the iliac system on the right.  On angiography, the patient has residual stenosis within his distal superficial femoral artery however he has in line flow through the anterior tibial and posterior tibial artery across the ankle.  Both tibial vessels are diseased.  Indications:  I was called earlier tonight when the patient had severe pain in his right groin.  Shortly after his initial pain episode, the pain extended down his thigh into the knee region.  He also developed progressive ischemia with mottling and bluish discoloration.  He also had decreased motor function of his toes as well as decreased sensation.  I discussed the limb threatening nature of this problem with the patient and took him emergently to the operating room  Procedure:  The patient was identified in the holding area and taken to [facility] OR ROOM 16  The patient was then placed supine on the table. general anesthesia was administered.  The patient was prepped and draped in the usual sterile  fashion.  A time out was called and antibiotics were administered.  A longitudinal incision was made in the right groin.  Cautery is used to divide the subcutaneous tissue.  Using sharp dissection I opened up the femoral sheath.  2 previous Angio-Seal devices were present.  I isolated the proximal common femoral artery as well as the profunda and superficial femoral artery.  There were several large crossing veins anterior to the common femoral artery which were ligated.  Once full exposure was obtained, the patient was heparinized.  I occluded the common femoral artery proximally with a Hanley clamp.  The profunda and superficial femoral arteries individually isolated.  There was no pulse within the common femoral artery prior to clamping.  A #11 blade was used to make an arteriotomy which was extended longitudinally.  There was a coral reef type appearance of the plaque in several locations.  The plaque at the proximal common femoral artery was nearly occlusive as was the plaque at the femoral bifurcation.  The plaque was removed with an elevator. The femoral bifurcation plaque extended into the profunda and superficial femoral artery which required an endarterectomy of each of these proximal vessels.  I placed several tacking sutures within the profundofemoral artery.  There was excellent backbleeding from the profunda femoral artery.  I tried to pass a Fogarty catheter down the superficial femoral artery however I met resistance at the adductor canal after the endarterectomy was performed I was still not satisfied with the inflow.  I placed a Benson wire followed by a 7 French sheath and performed a retrograde aortoiliac angiography.  This showed  the distal abdominal aorta to be widely patent.  There was diffuse disease throughout the right iliac system with a high-grade stenosis in the proximal external iliac artery.  I felt the entire length of the iliac vessel needed to be addressed.  Over a woolly wire I  selected a 7 x 1 20 EV-3 self-expanding stent.  This was deployed near the proximal common iliac artery down into the distal external iliac artery.  The stent was molded to confirmation with a 6 mm balloon.  Retrograde angiography revealed significantly improved blood flow and opacification of the right iliac arterial system.  When I removed the sheath and wire there was excellent pulsatile flow.  The artery was then occluded with a Hanley clamp.  I selected a bovine pericardial patch and performed patch angioplasty of the arteriotomy with a running 5-0 Prolene.  Prior to completion the appropriate flushing maneuvers were performed.  The anastomosis was completed.  Next 3 micropuncture sheath and performed right lower extremity runoff.  This showed patent superficial femoral artery with stenosis of approximately 50-60% within the adductor canal.  All tibial vessels were diffusely diseased however the anterior tibial and posterior tibial artery did across the ankle and form a plantar arch.  At this point I did not feel any further intervention was warranted.  The patient's calf muscles remained very soft and so I elected not to perform fasciotomies.  The patient's heparin was reversed with 50 mg of protamine.  Once hemostasis was satisfactory, the femoral sheath was reapproximated with 2-0 Vicryl.  Subcutaneous tissue was then closed in multiple layers of 3-0 Vicryl.  The skin was closed with 4-0 Vicryl and Dermabond was applied.  There were no immediate complications.   Disposition:  To PACU in stable condition.   Juleen China, M.D. [facility] Office: [phone] Pager:  [phone]

## 2013-03-15 NOTE — Procedures (Signed)
Extubation Procedure Note  Patient Details:   Name: TERON BLAIS DOB: 05/29/1950 MRN: [id number]   Airway Documentation:   + air leak around cuff prior to extubation.   Evaluation  O2 sats: stable throughout Complications: No apparent complications Patient did tolerate procedure well. Bilateral Breath Sounds: Clear;Diminished Suctioning: Airway Yes, pt able to speak, no stridor noted.  No distress noted.   Jennette Kettle 03/15/2013, 11:22 AM

## 2013-03-15 NOTE — Progress Notes (Signed)
ANTICOAGULATION CONSULT NOTE  Pharmacy Consult for heparin Indication: atrial fibrillation  Allergies  Allergen Reactions  . Lodine [Etodolac] Nausea And Vomiting  . Methotrexate Derivatives Nausea And Vomiting  . Ranitidine Hcl Nausea Only  . Remicade [Infliximab] Other (See Comments)    Chills and shakes     Patient Measurements: Height: 5\' 9"  (175.3 cm) Weight: 207 lb 7.3 oz (94.1 kg) IBW/kg (Calculated) : 70.7 Heparin Dosing Weight: 82 kg  Vital Signs: Temp: 97.2 F (36.2 C) (12/17 0357) Temp src: Axillary (12/17 0357) BP: 145/82 mmHg (12/17 0350) Pulse Rate: 70 (12/17 0350)  Labs:  Recent Labs  03/13/13 0718 03/13/13 1110 03/13/13 2115 03/13/13 2327 03/14/13 0820 03/14/13 2305 03/15/13 0137  HGB 12.2*  --  10.7*  --  11.6* 11.9* 10.9*  HCT 36.0*  --  34.7*  --  37.2* 35.0* 32.0*  PLT  --   --  186  --  181  --   --   APTT  --  24  --   --   --   --   --   LABPROT  --  13.6  --   --   --   --   --   INR  --  1.06  --   --   --   --   --   HEPARINUNFRC  --   --   --  0.13* 0.65  --   --   CREATININE 0.90 0.68  --   --  0.68  --   --     Estimated Creatinine Clearance: 111.3 ml/min (by C-G formula based on Cr of 0.68).  Assessment: 62 yo male with AFib, s/p femoral endarterectomy, for heparin.  Noted received total of 10,000 units IV heparin  boluses while in OR, followed by 50 mg protamine.  Goal of Therapy:  Heparin level 0.3-0.7 units/ml Monitor platelets by anticoagulation protocol: Yes   Plan:  Restart heparin 1550 units/hr at 0900 Check heparin level in 8 hours.  Geannie Risen, PharmD, BCPS

## 2013-03-15 NOTE — Progress Notes (Signed)
PT Cancellation Note  Patient Details Name: [name] [name] MRN: [id number] DOB: 1950-04-24   Cancelled Treatment:    Reason Eval/Treat Not Completed: Patient not medically ready--noted pt working on weaning with ?plans to extubate today.   Tishara Pizano 03/15/2013, 10:40 AM Pager [phone]

## 2013-03-15 NOTE — Care Management Note (Signed)
    Page 1 of 2   03/26/2013     8:40:27 AM   CARE MANAGEMENT NOTE 03/26/2013  Patient:  [name] [name], [name] [name]   Account Number:  [id number]  Date Initiated:  03/15/2013  Documentation initiated by:  [facility]  Subjective/Objective Assessment:   Admitted for artiogram - found to be in Afib - Now pulseless leg.  post op sugery for bypass on vent.     Action/Plan:   discharge planning   Anticipated DC Date:  03/22/2013   Anticipated DC Plan:  HOME W HOME HEALTH SERVICES      DC Planning Services  CM consult      Choice offered to / List presented to:          [facility] arranged  HH-1 RN      [facility] agency  [facility]   Status of service:  Completed, signed off Medicare Important Message given?   (If response is "NO", the following Medicare IM given date fields will be blank) Date Medicare IM given:   Date Additional Medicare IM given:    Discharge Disposition:  HOME W HOME HEALTH SERVICES  Per UR Regulation:  Reviewed for med. necessity/level of care/duration of stay  If discussed at Long Length of Stay Meetings, dates discussed:    Comments:  03/26/13 08:37 CM spoke with "Missy," [facility] rep who accepts referral and will fax facesheet, order, H&P, and discharge summary for [facility] services.  CM called Tedd Sias to give her [facility] contact number.  No other CM needs were communicated.  Freddy Jaksch, Georgiann Mohs [phone].  03/25/13 17:20 CM spoke with Spouse, [name] [name] who states any HH agency would be fine.  I explained to her it was going to be challenging to arrange this weekend but I would pass it to a CM on Monday if I was unsuccessful.  She states she understands.  Thus far, [facility], [facility], [facility], [facility] does not have staffing to accomodate referral.  CM is not getting answer at other agencies but will continue to try 03/26/13.  Freddy Jaksch, BSN, CM [phone].   Contact:  [name] [name], [name] [name] [phone]  [name] [name], [name] [name] Daughter     [phone]   03-20-13 1:30pm Avie Arenas, RNBSN [phone] Reintubated on 12-19 - now extubated again this am.  Plan for discharge home with family, but may need higher level of care.  CM will continue to follow.  03-15-13 10:00 am - Avie Arenas, RNBSN - [phone] Lives in [place] with his spouse.  Afib - post op endarterectomy and stents - on vent post op.  Depending on progression - may need rehab at some level.  CM will continue to follow.

## 2013-03-15 NOTE — Progress Notes (Addendum)
[facility] Subjective  - Intubated.    Objective 115/56 45 97.2 F (36.2 C) (Axillary) 13 100%  Intake/Output Summary (Last 24 hours) at 03/15/13 0801 Last data filed at 03/15/13 0700  Gross per 24 hour  Intake 3672.16 ml  Output   3305 ml  Net 367.16 ml    Doppler DP signals bilateral, non doppler PT bilateral Right lateral forefoot wound with min. Drainage. Right foot warm to touch  Assessment/Planning: POD #1 Right common femoral, profundofemoral, and superficial femoral endarterectomy with patch angioplasty Abdominal aortogram Right lower extremity runoff Stent, right common iliac artery Stent, right external iliac artery  Ordered dry  Dressing with santtyl to right foot wound daily  [name], [name] [facility] 03/15/2013 8:01 AM  Agree with above. Greatly appreciate Dr. Estanislado Spire help. Pt developed acute ischemia of the right LE and underwent:  #1: Right common femoral, profundofemoral, and superficial femoral endarterectomy with patch angioplasty  #2: Abdominal aortogram  #3: Right lower extremity runoff  #4: Stent, right common iliac artery  #5: Stent, right external iliac artery The right foot is now hyperemic and I suspect that he has adequate circulation to heal the wounds on the right lateral malleolus.  [name] Ferrari, MD, FACS Beeper [phone] 03/15/2013

## 2013-03-15 NOTE — Consult Note (Signed)
PULMONARY  / CRITICAL CARE MEDICINE  Name: LEMMIE VANLANEN MRN: [id number] DOB: Jan 03, 1951    ADMISSION DATE:  03/13/2013 CONSULTATION DATE:  12/17    REFERRING MD :  Myra Gianotti PRIMARY SERVICE: VVS  CHIEF COMPLAINT:  Ventilator management  BRIEF PATIENT DESCRIPTION:  43 M with extensive cardiac and PVD history presented 12/15 for scheduled arteriogram of BLEs and was found to be in [facility]. Admitted by Cardiology for mgmt with plan for arteriogram to be performed later in week. Developed acute arterial occlusion of RLE and underwent emergent R common femoral, profundofemoral, and superficial femoral endarterectomy with patch angioplasty. Remained intubated post op and PCCM asked to admit   SIGNIFICANT EVENTS / STUDIES:  12/15 adm with AFRVR  12/16 Acute RLE ischemia. To OR-- Emergent right common femoral, profundofemoral, and superficial femoral endarterectomy with patch angioplasty ([facility])   LINES / TUBES: ETT 12/16 >>  R radial A-line 12/16 >>   CULTURES: MRSA PCR 12/15 >> NEG  ANTIBIOTICS: Cefuroxime (peri-op) 12/16 >> 12/17   SUBJECTIVE:  Asynch with vent overnight, changed to PCV.  Now tol SBT.  Weaned off low dose dopamine.   VITAL SIGNS: Temp:  [97.2 F (36.2 C)-98.2 F (36.8 C)] 97.5 F (36.4 C) (12/17 0808) Pulse Rate:  [44-128] 67 (12/17 0800) Resp:  [12-24] 12 (12/17 0800) BP: (60-153)/(31-118) 153/118 mmHg (12/17 0800) SpO2:  [89 %-100 %] 100 % (12/17 0800) FiO2 (%):  [40 %] 40 % (12/17 0350) Weight:  [216 lb 14.9 oz (98.4 kg)] 216 lb 14.9 oz (98.4 kg) (12/17 0500) HEMODYNAMICS:   VENTILATOR SETTINGS: Vent Mode:  [-] PRVC FiO2 (%):  [40 %] 40 % Set Rate:  [14 bmp] 14 bmp Vt Set:  [550 mL] 550 mL PEEP:  [5 cmH20] 5 cmH20 Plateau Pressure:  [21 cmH20] 21 cmH20 INTAKE / OUTPUT: Intake/Output     12/16 0701 - 12/17 0700 12/17 0701 - 12/18 0700   P.O. 925    I.V. (mL/kg) 2397.7 (24.4) 19.8 (0.2)   NG/GT 30    IV Piggyback 550    Total  Intake(mL/kg) 3902.7 (39.7) 19.8 (0.2)   Urine (mL/kg/hr) 3130 (1.3) 20 (0.1)   Blood 400 (0.2)    Total Output 3530 20   Net +372.7 -0.2        Urine Occurrence 1 x    Stool Occurrence 2 x      PHYSICAL EXAMINATION: General:  Chronically ill appearing male, NAD on vent Neuro:  Drowsy but easily arousable, follows commands, nods appropriately, MAE  HEENT:  [place]/AT, PERRL, EOM-I and MMM. Cardiovascular:  RRR, Nl S1/S2, -M/R/G. Lungs:  Resps even non labored on PS wean, diminished bases, few scattered wheeze  Abdomen:  Soft, NT, ND and hypoactive BS. Musculoskeletal:  BLE warm and dry, dopplerable pulses  Skin:  Surgical wound noted.  LABS:  CBC  Recent Labs Lab 03/13/13 0718 03/13/13 2115 03/14/13 0820 03/14/13 2305 03/15/13 0137  WBC  --  9.6 9.4  --   --   HGB 12.2* 10.7* 11.6* 11.9* 10.9*  HCT 36.0* 34.7* 37.2* 35.0* 32.0*  PLT  --  186 181  --   --    Coag's  Recent Labs Lab 03/13/13 1110  APTT 24  INR 1.06   BMET  Recent Labs Lab 03/13/13 0718 03/13/13 1110 03/14/13 0820 03/14/13 2305 03/15/13 0059 03/15/13 0137 03/15/13 0203  NA 138 139 140 134*  --  134*  --   K 3.3* 3.6 3.5 5.0  --  3.3*  --  CL 101 101 100  --   --   --   --   CO2  --  26 28  --   --   --   --   BUN 15 13 12   --   --   --   --   CREATININE 0.90 0.68 0.68  --   --   --   --   GLUCOSE 262* 203* 199*  --  430*  --  374*   Electrolytes  Recent Labs Lab 03/13/13 1110 03/14/13 0820  CALCIUM 9.8 9.4   ABG  Recent Labs Lab 03/14/13 2305 03/15/13 0137 03/15/13 0701  PHART 7.331* 7.458* 7.402  PCO2ART 55.7* 38.4 45.6*  PO2ART 262.0* 380.0* 100.0   Liver Enzymes  Recent Labs Lab 03/13/13 1110  AST 8  ALT 16  ALKPHOS 67  BILITOT 0.3  ALBUMIN 3.2*   Cardiac Enzymes  Recent Labs Lab 03/14/13 0820  PROBNP 4169.0*   Glucose  Recent Labs Lab 03/14/13 2023 03/15/13 0339 03/15/13 0449 03/15/13 0554 03/15/13 0656 03/15/13 0805  GLUCAP 380* 308* 237* 222*  229* 190*    Imaging: Dg Chest Port 1 View  03/15/2013   CLINICAL DATA:  Post right lower extremity vascular surgery  EXAM: PORTABLE CHEST - 1 VIEW  COMPARISON:  03/13/2013; 11/09/2006  FINDINGS: Grossly unchanged enlarged cardiac silhouette and mediastinal contours given a reduced lung volumes and patient rotation. Atherosclerotic plaque within the thoracic aorta. Post median sternotomy. Grossly stable position of left anterior chest wall single lead AICD/pacemaker. Interval intubation with endotracheal tube overlying the tracheal air column with tip approximately 2.5 cm above the carina. Enteric tube tip and side port projects below the left hemidiaphragm. Chronic pulmonary venous congestion without frank evidence of edema. Grossly unchanged left basilar opacities, likely atelectasis. No definite pleural effusion or pneumothorax. Unchanged bones.  IMPRESSION: 1. Appropriately positioned support apparatus as above. No pneumothorax. 2. Cardiomegaly and pulmonary venous congestion without frank evidence of edema. 3. Unchanged left basilar atelectasis/scar.   Electronically Signed   By: Simonne Come M.D.   On: 03/15/2013 08:14   Dg Chest Port 1 View  03/13/2013   CLINICAL DATA:  CHF.  EXAM: PORTABLE CHEST - 1 VIEW  COMPARISON:  11/09/2006.  FINDINGS: Pacemaker enters from the left with leads in the region of the right ventricle. Battery pack limits evaluation of the left upper lung zone.  Post CABG.  Cardiomegaly.  Remote rib fractures.  No gross pneumothorax.  No segmental consolidation or pulmonary edema.  The patient would eventually benefit from two view chest.  IMPRESSION: Pacemaker enters from the left with leads in the region of the right ventricle. Battery pack limits evaluation of the left upper lung zone.  Post CABG.  Cardiomegaly.  No segmental consolidation or pulmonary edema.   Electronically Signed   By: Bridgett Larsson M.D.   On: 03/13/2013 16:42    ASSESSMENT /  PLAN:  PULMONARY A: Ventilator status P:   Extubate if okay with surgery PRN BD's F/u CXR   CARDIOVASCULAR A:  CAD, s/p CABG (2006) Chronic CHF AFRVR Severe PVD Hypotension Acute RLE arterial occlusion - s/p emergent revasc 12/16 P:  Cards following VVS managing post op issues Changed PO diltiazem to continuous infusion while intubated and titrate to maintain HR < 110/min  RENAL A:   Mild hyponatremia Mild hypokalemia P:   F/u chem  Correct electrolytes as indicated  GASTROINTESTINAL A:   GERD, chronic Chronic PPI use P:   SUP:  IV PPI  Tube feeds if not able to extubate soon  HEMATOLOGIC A:   Mild anemia without overt blood loss P:  DVT px: full heparin (for AF) Monitor CBC intermittently Transfuse for hemodynamically significant bleeding or Hgb < 7.0  INFECTIOUS A:   No issues P:   Micro and abx as above  ENDOCRINE A:   IDDM - uncontrolled Chronic prednisone (for RA) P:   Continue Insulin gtt  Transition to SQ insulin when able  NEUROLOGIC/Rheum A:   Rheumatoid Arthritis Post op pain ICU/ventilator associated discomfort P:   PRN fentanyl PRN Chrys Racer, NP 03/15/2013  9:04 AM Pager: [phone] or [phone]  Reviewed above, examined pt, and agree with assessment/plan.  Tolerating SBT.  Off dopamine.  Improved circulation to leg.  Remains on cardizem gtt and insulin gtt.  If no further plans for OR, then proceed with extubation.  Coralyn Helling, MD [facility] Pulmonary/Critical Care 03/15/2013, 10:09 AM Pager:  [phone] After 3pm call: [phone]

## 2013-03-15 NOTE — Progress Notes (Signed)
    Subjective  - POD #1  States right leg feels much better.  Just now extubated   Physical Exam:  Biphasic AT and PT signals in right foot.  Groin incision ok Much improved motor and sensory function to right foot Calf soft     Assessment/Plan:  POD #1  Can restart heparin Just extubated, looks good No need for fasciotomy at this time, will continue to follow  [facility] IV, [name] 03/15/2013 11:16 AM --  Filed Vitals:   03/15/13 0850  BP: 132/59  Pulse: 91  Temp:   Resp: 14    Intake/Output Summary (Last 24 hours) at 03/15/13 1116 Last data filed at 03/15/13 0809  Gross per 24 hour  Intake 3497.44 ml  Output   2600 ml  Net 897.44 ml     Laboratory CBC    Component Value Date/Time   WBC 8.8 03/15/2013 0900   HGB 9.9* 03/15/2013 0900   HCT 31.4* 03/15/2013 0900   PLT 165 03/15/2013 0900    BMET    Component Value Date/Time   NA 140 03/15/2013 0900   K 3.3* 03/15/2013 0900   CL 100 03/15/2013 0900   CO2 31 03/15/2013 0900   GLUCOSE 169* 03/15/2013 0900   BUN 18 03/15/2013 0900   CREATININE 0.92 03/15/2013 0900   CALCIUM 8.4 03/15/2013 0900   GFRNONAA 90* 03/15/2013 0900   GFRAA >90 03/15/2013 0900    COAG Lab Results  Component Value Date   INR 1.06 03/13/2013   No results found for this basename: PTT    Antibiotics Anti-infectives   Start     Dose/Rate Route Frequency Ordered Stop   03/15/13 0430  cefUROXime (ZINACEF) 1.5 g in dextrose 5 % 50 mL IVPB     1.5 g 100 mL/hr over 30 Minutes Intravenous Every 12 hours 03/15/13 0334 03/16/13 0429   03/14/13 2300  cefUROXime (ZINACEF) 1.5 g in dextrose 5 % 50 mL IVPB     1.5 g 100 mL/hr over 30 Minutes Intravenous To Surgery 03/14/13 2249 03/14/13 2330       V. Charlena Cross, M.D. [facility] Office: [phone] Pager:  [phone]

## 2013-03-16 ENCOUNTER — Inpatient Hospital Stay (HOSPITAL_COMMUNITY): Payer: Medicare Other

## 2013-03-16 ENCOUNTER — Encounter (HOSPITAL_COMMUNITY): Payer: Self-pay | Admitting: Surgery

## 2013-03-16 DIAGNOSIS — R579 Shock, unspecified: Secondary | ICD-10-CM

## 2013-03-16 DIAGNOSIS — I059 Rheumatic mitral valve disease, unspecified: Secondary | ICD-10-CM

## 2013-03-16 LAB — GLUCOSE, CAPILLARY
Glucose-Capillary: 91 mg/dL (ref 70–99)
Glucose-Capillary: 163 mg/dL — ABNORMAL HIGH (ref 70–99)
Glucose-Capillary: 165 mg/dL — ABNORMAL HIGH (ref 70–99)

## 2013-03-16 LAB — CBC
HCT: 30.5 % — ABNORMAL LOW (ref 39.0–52.0)
Hemoglobin: 9.5 g/dL — ABNORMAL LOW (ref 13.0–17.0)
MCH: 26.3 pg (ref 26.0–34.0)
MCHC: 31.1 g/dL (ref 30.0–36.0)
MCHC: 30.6 g/dL (ref 30.0–36.0)
MCV: 84.5 fL (ref 78.0–100.0)
MCV: 86.6 fL (ref 78.0–100.0)
Platelets: 156 10*3/uL (ref 150–400)
Platelets: 161 10*3/uL (ref 150–400)
RBC: 3.74 MIL/uL — ABNORMAL LOW (ref 4.22–5.81)
RDW: 16.9 % — ABNORMAL HIGH (ref 11.5–15.5)
RDW: 17.1 % — ABNORMAL HIGH (ref 11.5–15.5)
WBC: 9.7 10*3/uL (ref 4.0–10.5)
WBC: 13.4 10*3/uL — ABNORMAL HIGH (ref 4.0–10.5)

## 2013-03-16 LAB — TROPONIN I
Troponin I: 0.58 ng/mL (ref <0.30)
Troponin I: 0.47 ng/mL (ref <0.30)
Troponin I: 0.44 ng/mL (ref <0.30)
Troponin I: 1.32 ng/mL (ref <0.30)

## 2013-03-16 LAB — BASIC METABOLIC PANEL
BUN: 22 mg/dL (ref 6–23)
BUN: 25 mg/dL — ABNORMAL HIGH (ref 6–23)
CO2: 20 mEq/L (ref 19–32)
Calcium: 7.9 mg/dL — ABNORMAL LOW (ref 8.4–10.5)
Calcium: 7.8 mg/dL — ABNORMAL LOW (ref 8.4–10.5)
Chloride: 97 mEq/L (ref 96–112)
Chloride: 97 mEq/L (ref 96–112)
Creatinine, Ser: 1.04 mg/dL (ref 0.50–1.35)
Creatinine, Ser: 1.45 mg/dL — ABNORMAL HIGH (ref 0.50–1.35)
GFR calc Af Amer: 88 mL/min — ABNORMAL LOW (ref >90)
GFR calc Af Amer: 59 mL/min — ABNORMAL LOW (ref >90)
GFR calc non Af Amer: 76 mL/min — ABNORMAL LOW (ref >90)
GFR calc non Af Amer: 51 mL/min — ABNORMAL LOW (ref >90)
Potassium: 4.1 mEq/L (ref 3.5–5.1)

## 2013-03-16 LAB — POCT I-STAT, CHEM 8
Creatinine, Ser: 1.5 mg/dL — ABNORMAL HIGH (ref 0.50–1.35)
Glucose, Bld: 131 mg/dL — ABNORMAL HIGH (ref 70–99)
Hemoglobin: 10.5 g/dL — ABNORMAL LOW (ref 13.0–17.0)
TCO2: 20 mmol/L (ref 0–100)

## 2013-03-16 LAB — CK TOTAL AND CKMB (NOT AT ARMC)
CK, MB: 2.6 ng/mL (ref 0.3–4.0)
Total CK: 812 U/L — ABNORMAL HIGH (ref 7–232)

## 2013-03-16 LAB — PRO B NATRIURETIC PEPTIDE: Pro B Natriuretic peptide (BNP): 1650 pg/mL — ABNORMAL HIGH (ref 0–125)

## 2013-03-16 LAB — POCT I-STAT 3, ART BLOOD GAS (G3+)
O2 Saturation: 100 %
Patient temperature: 98.6
TCO2: 25 mmol/L (ref 0–100)
pH, Arterial: 7.281 — ABNORMAL LOW (ref 7.350–7.450)

## 2013-03-16 LAB — PHOSPHORUS: Phosphorus: 4.1 mg/dL (ref 2.3–4.6)

## 2013-03-16 LAB — POCT I-STAT 4, (NA,K, GLUC, HGB,HCT)
HCT: 28 % — ABNORMAL LOW (ref 39.0–52.0)
Sodium: 133 mEq/L — ABNORMAL LOW (ref 135–145)

## 2013-03-16 LAB — MAGNESIUM: Magnesium: 1.8 mg/dL (ref 1.5–2.5)

## 2013-03-16 MED ORDER — NOREPINEPHRINE BITARTRATE 1 MG/ML IJ SOLN
2.0000 ug/min | INTRAVENOUS | Status: DC
  Administered 2013-03-16: 50 ug/min via INTRAVENOUS
  Administered 2013-03-16: 30 ug/min via INTRAVENOUS
  Filled 2013-03-16 (×3): qty 16

## 2013-03-16 MED ORDER — DEXTROSE 5 % IV SOLN
30.0000 ug/min | INTRAVENOUS | Status: DC
  Administered 2013-03-16: 40 ug/min via INTRAVENOUS
  Filled 2013-03-16 (×2): qty 4

## 2013-03-16 MED ORDER — IOHEXOL 350 MG/ML SOLN
100.0000 mL | Freq: Once | INTRAVENOUS | Status: AC | PRN
  Administered 2013-03-16: 100 mL via INTRAVENOUS

## 2013-03-16 MED ORDER — SODIUM CHLORIDE 0.9 % IV BOLUS (SEPSIS)
500.0000 mL | Freq: Once | INTRAVENOUS | Status: AC
  Administered 2013-03-16: 500 mL via INTRAVENOUS

## 2013-03-16 MED ORDER — FENTANYL CITRATE 0.05 MG/ML IJ SOLN
INTRAMUSCULAR | Status: AC
  Administered 2013-03-16: 100 ug
  Filled 2013-03-16: qty 2

## 2013-03-16 MED ORDER — WARFARIN - PHARMACIST DOSING INPATIENT
Freq: Every day | Status: DC
  Administered 2013-03-16 – 2013-03-19 (×3)

## 2013-03-16 MED ORDER — VASOPRESSIN 20 UNIT/ML IJ SOLN
0.0300 [IU]/min | INTRAVENOUS | Status: DC
  Administered 2013-03-16: 0.03 [IU]/min via INTRAVENOUS
  Filled 2013-03-16: qty 2.5

## 2013-03-16 MED ORDER — CHLORHEXIDINE GLUCONATE 0.12 % MT SOLN
15.0000 mL | Freq: Two times a day (BID) | OROMUCOSAL | Status: DC
Start: 2013-03-16 — End: 2013-03-17
  Administered 2013-03-16 (×2): 15 mL via OROMUCOSAL
  Filled 2013-03-16 (×2): qty 15

## 2013-03-16 MED ORDER — MIDAZOLAM HCL 2 MG/2ML IJ SOLN
INTRAMUSCULAR | Status: AC
  Administered 2013-03-16: 2 mg
  Filled 2013-03-16: qty 2

## 2013-03-16 MED ORDER — SODIUM CHLORIDE 0.9 % IV SOLN
25.0000 ug/h | INTRAVENOUS | Status: DC
  Administered 2013-03-16: 50 ug/h via INTRAVENOUS
  Administered 2013-03-17: 100 ug/h via INTRAVENOUS
  Administered 2013-03-18: 75 ug/h via INTRAVENOUS
  Filled 2013-03-16 (×3): qty 50

## 2013-03-16 MED ORDER — BIOTENE DRY MOUTH MT LIQD
1.0000 "application " | Freq: Four times a day (QID) | OROMUCOSAL | Status: DC
  Administered 2013-03-16 – 2013-03-17 (×3): 15 mL via OROMUCOSAL

## 2013-03-16 MED ORDER — WARFARIN SODIUM 7.5 MG PO TABS
7.5000 mg | ORAL_TABLET | Freq: Once | ORAL | Status: AC
  Administered 2013-03-16: 7.5 mg via ORAL
  Filled 2013-03-16: qty 1

## 2013-03-16 MED ORDER — PANTOPRAZOLE SODIUM 40 MG IV SOLR
40.0000 mg | INTRAVENOUS | Status: DC
  Administered 2013-03-16 – 2013-03-20 (×5): 40 mg via INTRAVENOUS
  Filled 2013-03-16 (×7): qty 40

## 2013-03-16 NOTE — Progress Notes (Signed)
Physical Therapy Treatment Patient Details Name: [name] [name] MRN: [id number] DOB: [birth year] Today's Date: 03/16/2013 Time: 4098-1191 PT Time Calculation (min): 26 min  PT Assessment / Plan / Recommendation  History of Present Illness [name] with extensive cardiac and PVD history presented 12/15 for scheduled arteriogram of BLEs and was found to be in AFRVR. Admitted by Cardiology for mgmt with plan for arteriogram to be performed later in week. Developed acute arterial occlusion of RLE and underwent emergent R common femoral, profundofemoral, and superficial femoral endarterectomy with patch angioplasty. Remained intubated post op and PCCM asked to admit   PT Comments   Noted pt with continued hypotension with goal MAP >55. Spoke with RN who felt OK to attempt activity. MAP 58 on arrival and started with bed exercises to try to incr BP and assess for appropriate response. MAP incr to 60 and HOB elevated 20 degrees. Pt became symptomatic with nausea/dry heaves, pain in Rt groin, and feeling hot. MAP down to 48 and HOB lowered. RN notified and in to assess pt. Further therapy deferred.  Follow Up Recommendations  Other (comment) (TBA--medically unable to mobilize this date)     Does the patient have the potential to tolerate intense rehabilitation     Barriers to Discharge        Equipment Recommendations  None recommended by PT    Recommendations for Other Services    Frequency Min 3X/week   Progress towards PT Goals Progress towards PT goals: Not progressing toward goals - comment (medical instability)  Plan Other (comment) (May need to be updated once medically stable for activity)    Precautions / Restrictions Precautions Precautions: Fall   Pertinent Vitals/Pain Rt groin with HOB elevated to 50 degrees; "burning" and wanted his gown moved so "nothing is touching it" (intensity not rated).  See above re: MAP.    Mobility  Bed Mobility Bed Mobility: Not assessed Details  for Bed Mobility Assistance: Pt became nauseous with "dry heaves", BP decreased, and reported incr Rt groin pain with elevating HOB 30-50 degrees Transfers Transfers: Not assessed    Exercises General Exercises - Upper Extremity Elbow Flexion: AROM;Both;10 reps;Supine Elbow Extension: AROM;Both;10 reps;Supine Digit Composite Flexion: AROM;Both;10 reps;Supine Composite Extension: AROM;Both;10 reps;Supine General Exercises - Lower Extremity Ankle Circles/Pumps: AROM;Both;10 reps;Supine Quad Sets: AROM;Both;10 reps;Supine   PT Diagnosis:    PT Problem List:   PT Treatment Interventions:     PT Goals (current goals can now be found in the care plan section)    Visit Information  Last PT Received On: 03/16/13 Assistance Needed: +2 History of Present Illness: 85 M with extensive cardiac and PVD history presented 12/15 for scheduled arteriogram of BLEs and was found to be in AFRVR. Admitted by Cardiology for mgmt with plan for arteriogram to be performed later in week. Developed acute arterial occlusion of RLE and underwent emergent R common femoral, profundofemoral, and superficial femoral endarterectomy with patch angioplasty. Remained intubated post op and PCCM asked to admit    Subjective Data  Subjective: Why does it burn so much? (his Rt groin incision)   Cognition  Cognition Arousal/Alertness: Lethargic Behavior During Therapy: WFL for tasks assessed/performed Overall Cognitive Status: Within Functional Limits for tasks assessed    Balance     End of Session PT - End of Session Equipment Utilized During Treatment: Oxygen Activity Tolerance: Treatment limited secondary to medical complications (Comment) Patient left: in bed;with call bell/phone within reach;with nursing/sitter in room Nurse Communication: Other (comment) (decr BP, nausea/dry  heaves, and pain)   GP     [name] 03/16/2013, 2:58 PM Pager [phone]

## 2013-03-16 NOTE — Progress Notes (Signed)
Dr. Herma Carson updated on pt status. Made aware pt tolerating weaning of levophed. CTPA negative for PE. Orders received to place NGT for meds-will obtain xray to verify placement. Will continue to monitor closely. Kerin Ransom, RN

## 2013-03-16 NOTE — Progress Notes (Signed)
PULMONARY  / CRITICAL CARE MEDICINE  Name: [name] [name] MRN: [id number] DOB: 01-May-1950    ADMISSION DATE:  03/13/2013 CONSULTATION DATE:  12/17    REFERRING MD :  Myra Gianotti PRIMARY SERVICE: VVS  CHIEF COMPLAINT:  Ventilator management  BRIEF PATIENT DESCRIPTION:  47 M with extensive cardiac and PVD history presented 12/15 for scheduled arteriogram of BLEs and was found to be in [facility]. Admitted by Cardiology for mgmt with plan for arteriogram to be performed later in week. Developed acute arterial occlusion of RLE and underwent emergent R common femoral, profundofemoral, and superficial femoral endarterectomy with patch angioplasty. Remained intubated post op and PCCM asked to admit   SIGNIFICANT EVENTS / STUDIES:  12/15 adm with AFRVR  12/16 Acute RLE ischemia. To OR-- Emergent right common femoral, profundofemoral, and superficial femoral endarterectomy with patch angioplasty ([place])   LINES / TUBES: ETT 12/16 >> 12--17 R radial A-line 12/16 >> 12-18  CULTURES: MRSA PCR 12/15 >> NEG  ANTIBIOTICS: Cefuroxime (peri-op) 12/16 >> 12/17   SUBJECTIVE:  Awake and stable  VITAL SIGNS: Temp:  [97.3 F (36.3 C)-99.5 F (37.5 C)] 99.5 F (37.5 C) (12/18 0827) Pulse Rate:  [38-135] 70 (12/18 0800) Resp:  [12-27] 16 (12/18 0800) BP: (62-161)/(35-105) 91/62 mmHg (12/18 0800) SpO2:  [86 %-100 %] 96 % (12/18 0800) FiO2 (%):  [40 %] 40 % (12/17 1100) Weight:  [220 lb 0.3 oz (99.8 kg)] 220 lb 0.3 oz (99.8 kg) (12/18 0354) INTAKE / OUTPUT: Intake/Output     12/17 0701 - 12/18 0700 12/18 0701 - 12/19 0700   P.O. 360    I.V. (mL/kg) 1631.9 (16.4) 47.3 (0.5)   NG/GT 30    IV Piggyback 350    Total Intake(mL/kg) 2371.9 (23.8) 47.3 (0.5)   Urine (mL/kg/hr) 470 (0.2) 40 (0.3)   Emesis/NG output 100 (0)    Blood     Total Output 570 40   Net +1801.9 +7.3          PHYSICAL EXAMINATION: General:  Chronically ill appearing male, awake Neuro:  Intact alert,   HEENT:   [place]/AT, PERRL, EOM-I and MMM. Speech hoarse Cardiovascular:  RRR, Nl S1/S2, -M/R/G. Lungs:  Mild rhonchi  Abdomen:  Soft, NT, ND and hypoactive BS. Musculoskeletal:  BLE warm and dry, dopplerable pulses  Skin:  Surgical wound noted.  LABS:  CBC  Recent Labs Lab 03/14/13 0820  03/15/13 0137 03/15/13 0900 03/16/13 0240  WBC 9.4  --   --  8.8 9.7  HGB 11.6*  < > 10.9* 9.9* 9.5*  HCT 37.2*  < > 32.0* 31.4* 30.5*  PLT 181  --   --  165 156  < > = values in this interval not displayed. Coag's  Recent Labs Lab 03/13/13 1110  APTT 24  INR 1.06   BMET  Recent Labs Lab 03/14/13 0820  03/15/13 0137 03/15/13 0203 03/15/13 0900 03/16/13 0240  NA 140  < > 134*  --  140 133*  K 3.5  < > 3.3*  --  3.3* 4.1  CL 100  --   --   --  100 97  CO2 28  --   --   --  31 26  BUN 12  --   --   --  18 22  CREATININE 0.68  --   --   --  0.92 1.04  GLUCOSE 199*  < >  --  374* 169* 187*  < > = values in this interval not displayed.  Electrolytes  Recent Labs Lab 03/14/13 0820 03/15/13 0900 03/16/13 0240  CALCIUM 9.4 8.4 7.9*  MG  --   --  1.8  PHOS  --   --  4.1   ABG  Recent Labs Lab 03/14/13 2305 03/15/13 0137 03/15/13 0701  PHART 7.331* 7.458* 7.402  PCO2ART 55.7* 38.4 45.6*  PO2ART 262.0* 380.0* 100.0   Liver Enzymes  Recent Labs Lab 03/13/13 1110  AST 8  ALT 16  ALKPHOS 67  BILITOT 0.3  ALBUMIN 3.2*   Cardiac Enzymes  Recent Labs Lab 03/14/13 0820 03/16/13 0405  TROPONINI  --  0.58*  PROBNP 4169.0* 1650.0*   Glucose  Recent Labs Lab 03/15/13 1445 03/15/13 1605 03/15/13 1725 03/15/13 1923 03/15/13 2312 03/16/13 0330  GLUCAP 97 91 105* 136* 203* 185*    Imaging: Dg Chest Portable 1 View  03/16/2013   CLINICAL DATA:  Atelectasis.  EXAM: PORTABLE CHEST - 1 VIEW  COMPARISON:  03/15/2013.  FINDINGS: Cardiopericardial silhouette is enlarged. CABG markers are present along with median sternotomy wires. Endotracheal tube and enteric tube have been  removed. AICD lead appears unchanged. Left basilar atelectasis appears similar to the prior exam. There is no airspace disease. No pleural effusion is identified. Monitoring leads project over the chest.  IMPRESSION: Cardiomegaly without failure. Interval removal of support apparatus.   Electronically Signed   By: Andreas Newport M.D.   On: 03/16/2013 07:57   Dg Chest Port 1 View  03/15/2013   CLINICAL DATA:  Post right lower extremity vascular surgery  EXAM: PORTABLE CHEST - 1 VIEW  COMPARISON:  03/13/2013; 11/09/2006  FINDINGS: Grossly unchanged enlarged cardiac silhouette and mediastinal contours given a reduced lung volumes and patient rotation. Atherosclerotic plaque within the thoracic aorta. Post median sternotomy. Grossly stable position of left anterior chest wall single lead AICD/pacemaker. Interval intubation with endotracheal tube overlying the tracheal air column with tip approximately 2.5 cm above the carina. Enteric tube tip and side port projects below the left hemidiaphragm. Chronic pulmonary venous congestion without frank evidence of edema. Grossly unchanged left basilar opacities, likely atelectasis. No definite pleural effusion or pneumothorax. Unchanged bones.  IMPRESSION: 1. Appropriately positioned support apparatus as above. No pneumothorax. 2. Cardiomegaly and pulmonary venous congestion without frank evidence of edema. 3. Unchanged left basilar atelectasis/scar.   Electronically Signed   By: Simonne Come M.D.   On: 03/15/2013 08:14    ASSESSMENT / PLAN:  PULMONARY A: Ventilator status >> Extubated 12-17 P:   PRN BD's F/u CXR  Pulmonary hygiene  CARDIOVASCULAR A:  CAD, s/p CABG (2006) Chronic CHF AFRVR Severe PVD Hypotension Acute RLE arterial occlusion - s/p emergent revasc 12/16 P:  Cards following VVS managing post op issues Changed PO diltiazem to continuous infusion while intubated and titrate to maintain HR < 110/min, 12-17 dilt drip off, per cards Dc aline  when off pressors  RENAL A:   Mild hyponatremia Mild hypokalemia P:   F/u chem  Correct electrolytes as indicated  GASTROINTESTINAL A:   GERD, chronic Chronic PPI use P:   On po's 12-18  HEMATOLOGIC A:   Mild anemia without overt blood loss P:  DVT px: full heparin (for AF) Monitor CBC intermittently Transfuse for hemodynamically significant bleeding or Hgb < 7.0  INFECTIOUS A:   No issues P:   Micro and abx as above  ENDOCRINE A:   IDDM - uncontrolled Chronic prednisone (for RA) P:   12-18 off Insulin gtt  SQ insulin  NEUROLOGIC/Rheum A:  Rheumatoid Arthritis Post op pain P:   PRN fentanyl  Brett Canales Minor ACNP [facility] PCCM Pager [phone] till 3 pm If no answer page [phone] 03/16/2013, 8:35 AM  Reviewed above, examined pt, and agree with assessment/plan.  Stable after extubation 12/17.  PCCM will sign off.  If additional medical assistance needed, then recommend consulting [facility].  Coralyn Helling, MD [facility] Pulmonary/Critical Care 03/16/2013, 10:00 AM Pager:  [phone] After 3pm call: [phone]

## 2013-03-16 NOTE — Progress Notes (Signed)
Called to bedside for central line placement.  Upon arrival to room patient noted to be pale, diaphoretic and altered.  Moaning / writhing in bed.  In discussing central line consent with daughter, patient was noted to have decreased LOC, decreased saturations and bradycardia. Respirations ceased and assisted with BVM.  Patient given epi, atropine and bicarb.  Code Blue called.  Patient intubated under emergent conditions. No drugs necessary for intubation.  See intubation & code documentation.  Patient had improvement in color and hemodynamics post intubation.  STAT labs obtained and pending.  CXR pending.   Canary Brim, NP-C [place] Pulmonary & Critical Care Pgr: [phone] or [phone]  Currently in shock and acute respiratory failure.  Etiology id not clear.  Clinically suspect PE (hypotension, chest pain, dyspnea), though unlikely on Heparin gtt. Hb is stable so hemorrhagic shock is not likely. ? AMI. ? Pericardial effusion with tamponade.  -->  Chest CTA -->  STAT echo -->  12 lead EKG -->  CBC, BMP, cardiac enzymes, lactate -->  Venous Doppler -->  Vasopressors -->  Vent support  I have personally obtained history, examined patient, evaluated and interpreted laboratory and imaging results, reviewed medical records, formulated assessment / plan and placed orders.  CRITICAL CARE:  The patient is critically ill with multiple organ systems failure and requires high complexity decision making for assessment and support, frequent evaluation and titration of therapies, application of advanced monitoring technologies and extensive interpretation of multiple databases. Critical Care Time devoted to patient care services described in this note is 60 minutes.   Lonia Farber, MD Pulmonary and Critical Care Medicine [facility] Pager: [phone]  03/16/2013, 4:18 PM

## 2013-03-16 NOTE — Progress Notes (Addendum)
[facility]  Subjective  -" My throat is sore and I feel like I'm having trouble breathing at times."   BP labile, he was started on Neo synephrine.   Objective 99/58 66 98.9 F (37.2 C) (Axillary) 16 97%  Intake/Output Summary (Last 24 hours) at 03/16/13 0758 Last data filed at 03/16/13 0700  Gross per 24 hour  Intake 2341.85 ml  Output    520 ml  Net 1821.85 ml    Right groin incision clean and dry.  Soft without hematoma. Doppler DP 2+, PT1+ signals skin is warm to touch. Lateral malleolus dressing in place.  Assessment/Planning: POD #2 Right common femoral, profundofemoral, and superficial femoral endarterectomy with patch angioplasty  Abdominal aortogram  Right lower extremity runoff  Stent, right common iliac artery  Stent, right external iliac artery  Per Dr. [place] Cardiology recommendations: 1)A fib and RVR Until BP stabilizes I would use IV diltiazem as needed for rate control.  Coreg held due to hypotension. Heparin resumed given high [place] score. Recommend starting oral coumadin when OK with vascular surgery. Anticipate he will need ASA, plavix, and coumadin for one month then drop ASA. For now will manage with rate control. Consider DCCV in 4 weeks after being on coumadin.  2)Hypotension. On IV neo. Will hold Coreg, Avapro, nitrates, and hydralazine until BP improves and then add back carefully.   3)Acute on chronic systolic CHF. CXR today is clear. Will hold diuretics for now with hypotension.  Needs to continue antiplatelet therapy per vascular surgery but will need to be anticoagulated for Afib as well. When [facility] with surgery would resume heparin and transition to coumadin.  4) CAD s/p CABG  5) DM type 2  I will continue Heparin, Plavix, and Asprin and Coumadin today.  Clinton Gallant [facility] 03/16/2013 7:58 AM --  Laboratory Lab Results:  Recent Labs  03/15/13 0900 03/16/13 0240  WBC 8.8 9.7  HGB 9.9* 9.5*  HCT  31.4* 30.5*  PLT 165 156   BMET  Recent Labs  03/15/13 0900 03/16/13 0240  NA 140 133*  K 3.3* 4.1  CL 100 97  CO2 31 26  GLUCOSE 169* 187*  BUN 18 22  CREATININE 0.92 1.04  CALCIUM 8.4 7.9*    COAG Lab Results  Component Value Date   INR 1.06 03/13/2013   No results found for this basename: PTT      The patient was reintubated overnight for respiratory distress.  His right foot remains warm and well perfused.  His right groin incision looks good.  Durene Cal

## 2013-03-16 NOTE — Progress Notes (Signed)
  Echocardiogram 2D Echocardiogram (limited) has been performed.  [name] [name] 03/16/2013, 3:45 PM

## 2013-03-16 NOTE — Progress Notes (Signed)
ANTICOAGULATION CONSULT NOTE   Pharmacy Consult for heparin Indication: atrial fibrillation  Allergies  Allergen Reactions  . Lodine [Etodolac] Nausea And Vomiting  . Methotrexate Derivatives Nausea And Vomiting  . Ranitidine Hcl Nausea Only  . Remicade [Infliximab] Other (See Comments)    Chills and shakes     Patient Measurements: Height: 5\' 9"  (175.3 cm) Weight: 220 lb 0.3 oz (99.8 kg) IBW/kg (Calculated) : 70.7 Heparin Dosing Weight: 82 kg  Vital Signs: Temp: 99.5 F (37.5 C) (12/18 0827) Temp src: Oral (12/18 0827) BP: 91/62 mmHg (12/18 0800) Pulse Rate: 70 (12/18 0800)  Labs:  Recent Labs  03/13/13 1110  03/14/13 0820  03/15/13 0137 03/15/13 0900 03/15/13 1735 03/16/13 0240 03/16/13 0405  HGB  --   < > 11.6*  < > 10.9* 9.9*  --  9.5*  --   HCT  --   < > 37.2*  < > 32.0* 31.4*  --  30.5*  --   PLT  --   < > 181  --   --  165  --  156  --   APTT 24  --   --   --   --   --   --   --   --   LABPROT 13.6  --   --   --   --   --   --   --   --   INR 1.06  --   --   --   --   --   --   --   --   HEPARINUNFRC  --   < > 0.65  --   --   --  0.33 0.29*  --   CREATININE 0.68  --  0.68  --   --  0.92  --  1.04  --   TROPONINI  --   --   --   --   --   --   --   --  0.58*  < > = values in this interval not displayed.  Estimated Creatinine Clearance: 87.9 ml/min (by C-G formula based on Cr of 1.04).  Assessment: 35 YOM with AFib, s/p femoral endarterectomy, continues on heparin.  Heparin level slightly low at 0.29.  Warfarin to start today.  Hemoglobin has slight downward trend.  No overt bleeding.  Goal of Therapy:  Heparin level 0.3-0.7 units/ml Monitor platelets by anticoagulation protocol: Yes INR 2-3   Plan:  1. Increase heparin to 1800units/hr 2. Check heparin level, INR, CBC with AM labs 3. Continue to monitor for signs and symptoms of bleeding  Celedonio Miyamoto, PharmD, [facility] Clinical Pharmacist Pager [phone]   03/16/2013 8:47 AM

## 2013-03-16 NOTE — Progress Notes (Signed)
Asked to eval for cvl placement  PAtient with ef 25%. Was on po coreg, IV  cardizem gtt for chf/a fib and on dopamine since returin to ICU early hours of 03/15/13. PEr RN dopamine was turned off at 10am 03/15/13.  Then RN around 10pm noticed sudden bradycardia and hypotension resulting in stopping of cardizem. Later when dopamine restarted.,.patient became very tachycardic. So dopamine turned to neo via PIV and pccm asked to place cvl  Currently patheitn deconditoned but oriented Obese HR 73 on cardizem at 10 BP MAP 70 on neo at 40 Also on po coreg  PLAN Hold off cvl Readjust bp goal to sbp > 95 and MAP > 55 based on ef 25% Titreate neo off and then cardizem off Check bnp and trop; if high consider dc coreg and do lasix Rx    Dr. Kalman Shan, M.D., [facility].C.P Pulmonary and Critical Care Medicine Staff Physician [facility] Pulmonary and Critical Care Pager: [phone], If no answer or between  15:00h - 7:00h: call [phone]  03/16/2013 12:15 AM

## 2013-03-16 NOTE — Progress Notes (Signed)
[name], NP at bedside to place central line at approximately 1425. Pt completely alert but restless and signed consent form at 1420. Within minutes, pt went unresponsive with cease in respirations. Pt brady down to 30-40s, see code sheet for details. Line placed by NP after intubation, ETT and line confirmed by xray. Dr. Herma Carson at bedside. Orders received for pressor support. Currently on levophed, neo, and vaso-will wean neo off. Orders for CTPA. 12-lead EKG ordered. Will continue to monitor closely. Kerin Ransom, RN

## 2013-03-16 NOTE — Procedures (Signed)
Name:  CHEYENNE SCHUMM MRN:  [id number] DOB:  [birth year]  PROCEDURE NOTE  Procedure:  Ultrasound-guided central venous catheter placement.  Indications:  Need for intravenous access and hemodynamic monitoring.  Consent:  Consent was implied due to the emergency nature of the procedure.  Anesthesia:  A total of 10 mL of 1% Lidocaine was used for local infiltration anesthesia.  Procedure summary:  Appropriate equipment was assembled.  The patient was identified as [place] and safety timeout was performed. The patient was placed in Trendelenburg position.  Sterile technique was used. The patient's left neck region was prepped using chlorhexidine / alcohol scrub and the field was draped in usual sterile fashion with full body drape. The left internal jugular vein and the left carotid artery were identified by ultrasound, the patency was evaluated and images were documented. After the adequate anesthesia was achieved, the vein was cannulated with the introducer needle under sonographic guidance without difficulty. A guide wire was advanced through the introducer needle, which was then withdrawn. A small skin incision was made at the point of wire entry, the dilator was inserted over the guide wire and appropriate dilation was obtained. The dilator was removed and triple-lumen catheter was advanced over the guide wire, which was then removed.  All ports were aspirated and flushed with normal saline without difficulty. The catheter was secured into place with sutures. Antibiotic patch was placed and sterile dressing was applied. Post-procedure chest x-ray was ordered.  Complications:  No immediate complications were noted.  Hemodynamic parameters and oxygenation remained stable throughout the procedure.  Estimated blood loss:  Less then 5 mL.  Orlean Bradford, M.D. Pulmonary and Critical Care Medicine [facility] Pager: [phone]  03/16/2013, 6:20 PM

## 2013-03-16 NOTE — Progress Notes (Signed)
Chaplain responded to Code Blue and offered emotional support to pt's daughter, Aurther Loft, in waiting area. She was teary and anxious, but said she knows "dad is ready spiritually when his time comes." RN came out and explained that pt had been intubated and had then stabilized, pt would receive an CT. Pt's daughter asked for privacy to call family and expressed gratitude for chaplain support. Will follow up.   Maurene Capes, [place] [phone]

## 2013-03-16 NOTE — Progress Notes (Signed)
Patient transported back from CT at this time.  Patient stable throughout, no complications.  Sat remained at 100%.

## 2013-03-16 NOTE — Progress Notes (Signed)
OT Cancellation Note  Patient Details Name: [name] [name] MRN: [id number] DOB: 09-22-1950   Cancelled Treatment:    Reason Eval/Treat Not Completed: Medical issues which prohibited therapy - will check back tomorrow and initiate eval as appropriate  Boykin Reaper [phone] 03/16/2013, 2:24 PM

## 2013-03-16 NOTE — Progress Notes (Signed)
Chaplain followed up with pt's daughter in waiting area. MD spoke with pt and explained that pt will be going for CAT scan. Chaplain asked pt's RN to bring daughter back to see pt before CAT scan. Pt's daughter said she has lost her husband and several family members in the last 3 years, and her father has been sick for twenty-five years. Other family are on the way. She was grateful for chaplain support. Will follow up, please page on-call chaplain after hours.   Guy Sandifer [place], [place] [phone]

## 2013-03-16 NOTE — Progress Notes (Signed)
Brett Canales Minor, NP made aware of pt status and decline. Made aware pt's BP decreased 60-70s despite max on neo gtt. Pt complaining of intermittent SOB and chest pain. Pt remains alert and appropriate but becoming more restless. Awaiting repeat troponin result (per cards). Orders received to give 500 mL bolus and transition to levophed gtt. Will continue to monitor closely. Kerin Ransom, RN

## 2013-03-16 NOTE — Progress Notes (Signed)
[name] [name], [facility] with vascular made aware of pt status. Pt complaining of intermittent chest pain and SOB, requiring upward titration of neo gtt, and inability to tolerate work with physical therapy. Do not feel pt is appropriate for transfer to stepdown at this time. Transfer order canceled. Will continue to monitor closely. Kerin Ransom, RN

## 2013-03-16 NOTE — Progress Notes (Signed)
At 2115 the patient's heart rate was noted to be significantly decreased into the 60s from previous readings that were in the 90s and the patient was hypotensive with a systolic blood pressure in the 70s-80s from the 110s.  At this time the cardizem was turned down.  After fifteen minutes of sustained low heart rate and blood pressure [facility] was notified and the doctor prescribed a 500 mL normal saline bolus and re-ordered the cardizem to be titrated from 0-15 and as a result the cardizem was turned off.  The bolus was administered with no resulting change in blood pressure or heart rate.  At 2150 [facility] was notified and ordered the dopamine to be restarted at a rate of 5 mcg/kg/min.  The dopamine was restarted and resulted in no change in patient status.  At 2206 [facility] was notified and the MD ordered the dopamine to be increased to 20 mcg/kg/min.  This resulted in an increase in the patients blood pressure to a systolic BP in the 90s and an increase in the patient's HR to the 130s.  At 2233 [facility] was again notified and ordered Neo to be started and dopamine to be discontinued when the Neo arrived.  The cardizem was restarted at 2240 at a rate of 10 mg/hr.  At 2309 the neo was started and the dopamine discontinued.  By 2330 the patient's blood pressure returned to a systolic in the 100s and the heart rated was in the 80s.

## 2013-03-16 NOTE — Progress Notes (Signed)
Cardiology called and brought up to date on pt current status. Pt converted back into A-fib/A-flutter after being in NSR for a few hours earlier today. Cardiology okay with rhythm and ordered to call back if HR increased and sustained >110 or bradycardic with hypotension. Notified of CP and SOB earlier in the day with elevated troponin that is currently trending down as well as the ECHO results from today. MD was okay with updates and no further interventions needed at this time, stated to address ICD in the am and suggested maybe a rate increase in the backup pacemaker since pt has had multiple bradycardic episodes this hospital visit.

## 2013-03-16 NOTE — Procedures (Signed)
Intubation Procedure Note [name] [name] [id number] Mar 17, 1951  Procedure: Intubation Indications: Respiratory insufficiency  Procedure Details Consent: Unable to obtain consent because of emergent medical necessity.  Time Out: Verified patient identification, verified procedure, site/side was marked, verified correct patient position, special equipment/implants available, medications/allergies/relevent history reviewed, required imaging and test results available.  Performed  Maximum sterile technique was used including cap, gloves, gown, hand hygiene and mask.  MAC and 3    Evaluation Hemodynamic Status: Transient hypotension treated with pressors and treated with fluid; O2 sats: stable throughout.    Tube position verified via bilateral breath sounds, condensation in ETT and tube exchanger.    Patient's Current Condition: stable Complications: No apparent complications Patient did tolerate procedure well. Chest X-ray ordered to verify placement.  CXR: tube position acceptable.   Canary Brim, NP-C [place] Pulmonary & Critical Care Pgr: [phone] or [phone]  03/16/2013  Supervised and present through the entire procedure.  Lonia Farber, MD Pulmonary and Critical Care Medicine [facility] Pager: [phone]

## 2013-03-16 NOTE — Progress Notes (Signed)
Critical Lab Value received by phone from lab of Troponin level 0.58.  [facility] called at 0520, Dr. Sung Amabile notified of result.  No further orders at this time.

## 2013-03-16 NOTE — Progress Notes (Signed)
62 yo WM admitted with afib and RVR, acute on chronic systolic CHF. EF 30-35%. History of prior CABG, remote MI, and ICD. Recent resting claudication with nonhealing ulcer right ankle. On 03/14/13 pm patient developed a cold right leg and was taken emergently to the OR for revascularization.  TELEMETRY: Reviewed telemetry pt in atrial flutter with controlled response. Diltiazem currently on hold due to hypotension.   Filed Vitals:   03/16/13 0615 03/16/13 0630 03/16/13 0645 03/16/13 0700  BP: 119/56 111/58 117/55 127/63  Pulse: 43 44 43 89  Temp:      TempSrc:      Resp: 21 21 21 21   Height:      Weight:      SpO2: 100% 93% 94% 93%    Intake/Output Summary (Last 24 hours) at 03/16/13 0748 Last data filed at 03/16/13 0700  Gross per 24 hour  Intake 2341.85 ml  Output    520 ml  Net 1821.85 ml    SUBJECTIVE Denies any chest pain or SOB. BP and HR labile. Now on IV phenylephrine.  LABS: Basic Metabolic Panel:  Recent Labs  11/91/47 0900 03/16/13 0240  NA 140 133*  K 3.3* 4.1  CL 100 97  CO2 31 26  GLUCOSE 169* 187*  BUN 18 22  CREATININE 0.92 1.04  CALCIUM 8.4 7.9*  MG  --  1.8  PHOS  --  4.1   Liver Function Tests:  Recent Labs  03/13/13 1110  AST 8  ALT 16  ALKPHOS 67  BILITOT 0.3  PROT 6.4  ALBUMIN 3.2*   No results found for this basename: LIPASE, AMYLASE,  in the last 72 hours CBC:  Recent Labs  03/15/13 0900 03/16/13 0240  WBC 8.8 9.7  HGB 9.9* 9.5*  HCT 31.4* 30.5*  MCV 84.2 84.5  PLT 165 156   BNP:  Hemoglobin A1C:  Recent Labs  03/13/13 1110  HGBA1C 9.0*   Thyroid Function Tests:  Recent Labs  03/13/13 1110  TSH 0.744   Troponin 0.58  Radiology/Studies:  Dg Chest Port 1 View  03/15/2013   CLINICAL DATA:  Post right lower extremity vascular surgery  EXAM: PORTABLE CHEST - 1 VIEW  COMPARISON:  03/13/2013; 11/09/2006  FINDINGS: Grossly unchanged enlarged cardiac silhouette and mediastinal contours given a reduced lung  volumes and patient rotation. Atherosclerotic plaque within the thoracic aorta. Post median sternotomy. Grossly stable position of left anterior chest wall single lead AICD/pacemaker. Interval intubation with endotracheal tube overlying the tracheal air column with tip approximately 2.5 cm above the carina. Enteric tube tip and side port projects below the left hemidiaphragm. Chronic pulmonary venous congestion without frank evidence of edema. Grossly unchanged left basilar opacities, likely atelectasis. No definite pleural effusion or pneumothorax. Unchanged bones.  IMPRESSION: 1. Appropriately positioned support apparatus as above. No pneumothorax. 2. Cardiomegaly and pulmonary venous congestion without frank evidence of edema. 3. Unchanged left basilar atelectasis/scar.   Electronically Signed   By: Simonne Come M.D.   On: 03/15/2013 08:14   Dg Chest Port 1 View  03/13/2013   CLINICAL DATA:  CHF.  EXAM: PORTABLE CHEST - 1 VIEW  COMPARISON:  11/09/2006.  FINDINGS: Pacemaker enters from the left with leads in the region of the right ventricle. Battery pack limits evaluation of the left upper lung zone.  Post CABG.  Cardiomegaly.  Remote rib fractures.  No gross pneumothorax.  No segmental consolidation or pulmonary edema.  The patient would eventually benefit from two view chest.  IMPRESSION: Pacemaker enters from the left with leads in the region of the right ventricle. Battery pack limits evaluation of the left upper lung zone.  Post CABG.  Cardiomegaly.  No segmental consolidation or pulmonary edema.   Electronically Signed   By: Bridgett Larsson M.D.   On: 03/13/2013 16:42   ECG today pending  PHYSICAL EXAM General: Obese, well nourished, in no acute distress.  Head: Normocephalic, atraumatic, sclera non-icteric, no xanthomas, nares are without discharge. Neck: Negative for carotid bruits. JVD 8 cm. Lungs: Clear Heart: IRRR S1 S2 without murmurs, rubs, or gallops.  Abdomen: Soft, non-tender, obese. No  hepatomegaly. No rebound/guarding. No obvious abdominal masses. Extremities: 1+ edema.  Pedal pulses are nonpalpable but feet are warm and dry. Ulcer on lateral aspect of right heel. Neuro: awake, alert. Oriented x 3. No focal findings.  ASSESSMENT AND PLAN: 1. Atrial flutter with RVR. Controlled off diltiazem for now. Until BP stabilizes I would use IV diltiazem as needed for rate control. Coreg held due to hypotension. Heparin resumed given high [place] score. Recommend starting oral coumadin when OK with vascular surgery. Anticipate he will need ASA, plavix, and coumadin for one month then drop ASA. For now will manage with rate control. Consider DCCV in 4 weeks after being on coumadin. 2. Acute on chronic systolic CHF.  CXR today is clear. Will hold diuretics for now with hypotension. 3. Acutely ischemic right leg due to advanced atherosclerosis. S/p right common femoral, produndofemoral, and superficial femoral endarterectomy with stenting of the right common and external iliac arteries. Needs to continue antiplatelet therapy per vascular surgery but will need to be anticoagulated for Afib as well. When [facility] with surgery would resume heparin and transition to coumadin. 4. Hypotension. On IV neo. Will hold Coreg, Avapro, nitrates, and hydralazine until BP improves and then add back carefully. 4. CAD s/p CABG 5. DM type 2   Principal Problem:   Atrial fibrillation Active Problems:   Embolism and thrombosis of iliac artery   Peripheral vascular disease, unspecified   Acute on chronic systolic CHF (congestive heart failure)   Diabetes mellitus type 2 with peripheral artery disease   CAD (coronary artery disease)   S/P CABG x 5   Atrial fibrillation, rapid   Bradycardia   Hypotension, unspecified   Acute respiratory failure    Signed, Peter Swaziland MD,FACC 03/16/2013 7:48 AM

## 2013-03-16 NOTE — Code Documentation (Signed)
CODE BLUE NOTE  Patient Name: [name] [name]   MRN: [id number]   Date of Birth/ Sex: 01-29-1951 , male      Admission Date: 03/13/2013  Attending Provider: Chuck Hint, MD  Primary Diagnosis: Atrial fibrillation    Indication: Pt with extensive Cardiac and PVD hx presented on 12/15 for scheduled arteriogram and was found to be in [facility], subsequently developed acute arterial occlusion of RLE and underwent emergent endarterectomy with angioplasty. Remained intubated post-op but was successfully extubated 12/17. Pt was in his usual state of health until this PM, when he was noted to be complaining of intermittent CP and SOB, requiring titration of ggt. Pt then noted to brady down and found to be apenic. Code blue was subsequently called. At the time of arrival on scene, ACLS protocol was underway.    Technical Description:  - CPR performance duration:  0 minute  - Was defibrillation or cardioversion used? No  - Was external pacer placed? No  - Was patient intubated pre/post CPR? Patient intubated sucessfully, no CPR performed    Medications Administered: Y = Yes; Blank = No Amiodarone    Atropine  Y  Calcium    Epinephrine  Y X2  Lidocaine    Magnesium    Norepinephrine    Phenylephrine    Sodium bicarbonate    Vasopressin      Post CPR evaluation:  - Final Status - Was patient successfully resuscitated ? Yes - What is current rhythm? Sinus tach - What is current hemodynamic status? Hemodynamically stable with secure airway   Miscellaneous Information:  - Labs sent, including: BMET, H&H, ABG  - Primary team notified?  Yes  - Family Notified? Yes  - Additional notes/ transfer status: secure airway in place, PCCM at bedside   Anselm Lis, MD  03/16/2013, 2:54 PM

## 2013-03-16 NOTE — Progress Notes (Signed)
Dr. Swaziland made aware pt complaining of intermittent chest pain and SOB, most recently after attempting to work with PT. Pt states "it feels like someone is standing on my chest" however he only rates his pain approximately 5/10. Also complaining of intermittent nausea. Troponin elevated on AM labs-will draw serial enzymes. No acute changes on EKG this am. Pt SBP 70-90's requiring upward titration of neo gtt. Will continue to monitor closely. Kerin Ransom, RN

## 2013-03-16 NOTE — Progress Notes (Signed)
Dr. Herma Carson on unit. Approached about concern for pt. Awaiting levo gtt. Currently max on neo gtt with SBP 60s (cuff correlating with aline). 500 mL bolus currently infusing. Made aware of intermittent chest pain and SOB. Also requesting central line placement-pt currently only with PIV's. [name], NP to come to bedside to place central line and then Dr. Herma Carson to come back to assess pt. Will continue to monitor closely. Kerin Ransom, RN

## 2013-03-17 ENCOUNTER — Inpatient Hospital Stay (HOSPITAL_COMMUNITY): Payer: Medicare Other

## 2013-03-17 DIAGNOSIS — I4892 Unspecified atrial flutter: Secondary | ICD-10-CM

## 2013-03-17 DIAGNOSIS — I70229 Atherosclerosis of native arteries of extremities with rest pain, unspecified extremity: Secondary | ICD-10-CM

## 2013-03-17 LAB — BASIC METABOLIC PANEL
CO2: 22 mEq/L (ref 19–32)
Calcium: 7.8 mg/dL — ABNORMAL LOW (ref 8.4–10.5)
Chloride: 99 mEq/L (ref 96–112)
Glucose, Bld: 219 mg/dL — ABNORMAL HIGH (ref 70–99)
Sodium: 132 mEq/L — ABNORMAL LOW (ref 135–145)

## 2013-03-17 LAB — CBC
HCT: 31.4 % — ABNORMAL LOW (ref 39.0–52.0)
Hemoglobin: 9.7 g/dL — ABNORMAL LOW (ref 13.0–17.0)
Platelets: 151 10*3/uL (ref 150–400)
RBC: 3.7 MIL/uL — ABNORMAL LOW (ref 4.22–5.81)
WBC: 9 10*3/uL (ref 4.0–10.5)

## 2013-03-17 LAB — GLUCOSE, CAPILLARY
Glucose-Capillary: 206 mg/dL — ABNORMAL HIGH (ref 70–99)
Glucose-Capillary: 151 mg/dL — ABNORMAL HIGH (ref 70–99)
Glucose-Capillary: 81 mg/dL (ref 70–99)

## 2013-03-17 LAB — HEPATIC FUNCTION PANEL
ALT: 3524 U/L — ABNORMAL HIGH (ref 0–53)
AST: 2541 U/L — ABNORMAL HIGH (ref 0–37)
Bilirubin, Direct: 0.4 mg/dL — ABNORMAL HIGH (ref 0.0–0.3)
Indirect Bilirubin: 0.6 mg/dL (ref 0.3–0.9)
Total Bilirubin: 1 mg/dL (ref 0.3–1.2)
Total Protein: 5.5 g/dL — ABNORMAL LOW (ref 6.0–8.3)

## 2013-03-17 LAB — TROPONIN I: Troponin I: 0.91 ng/mL (ref <0.30)

## 2013-03-17 LAB — PROTIME-INR
INR: 1.9 — ABNORMAL HIGH (ref 0.00–1.49)
Prothrombin Time: 21.2 seconds — ABNORMAL HIGH (ref 11.6–15.2)

## 2013-03-17 LAB — LIPASE, BLOOD: Lipase: 18 U/L (ref 11–59)

## 2013-03-17 LAB — HEPARIN LEVEL (UNFRACTIONATED): Heparin Unfractionated: 0.4 IU/mL (ref 0.30–0.70)

## 2013-03-17 LAB — LACTIC ACID, PLASMA: Lactic Acid, Venous: 1.2 mmol/L (ref 0.5–2.2)

## 2013-03-17 MED ORDER — ACETAMINOPHEN 160 MG/5ML PO SOLN
650.0000 mg | Freq: Four times a day (QID) | ORAL | Status: DC | PRN
  Administered 2013-03-17: 650 mg via ORAL
  Filled 2013-03-17 (×2): qty 20.3

## 2013-03-17 MED ORDER — MIDAZOLAM HCL 2 MG/2ML IJ SOLN
1.0000 mg | INTRAMUSCULAR | Status: DC | PRN
  Administered 2013-03-17: 2 mg via INTRAVENOUS
  Filled 2013-03-17 (×2): qty 2

## 2013-03-17 MED ORDER — BIOTENE DRY MOUTH MT LIQD
15.0000 mL | Freq: Four times a day (QID) | OROMUCOSAL | Status: DC
  Administered 2013-03-17 – 2013-03-25 (×29): 15 mL via OROMUCOSAL

## 2013-03-17 MED ORDER — WARFARIN SODIUM 2.5 MG PO TABS
2.5000 mg | ORAL_TABLET | Freq: Once | ORAL | Status: AC
  Administered 2013-03-17: 2.5 mg via ORAL
  Filled 2013-03-17: qty 1

## 2013-03-17 MED ORDER — CHLORHEXIDINE GLUCONATE 0.12 % MT SOLN
15.0000 mL | Freq: Two times a day (BID) | OROMUCOSAL | Status: DC
  Administered 2013-03-17 – 2013-03-25 (×16): 15 mL via OROMUCOSAL
  Filled 2013-03-17 (×19): qty 15

## 2013-03-17 MED ORDER — DOCUSATE SODIUM 50 MG/5ML PO LIQD
100.0000 mg | Freq: Every day | ORAL | Status: DC
  Administered 2013-03-17 – 2013-03-19 (×3): 100 mg
  Filled 2013-03-17 (×6): qty 10

## 2013-03-17 MED ORDER — ASPIRIN 81 MG PO CHEW
81.0000 mg | CHEWABLE_TABLET | Freq: Every day | ORAL | Status: DC
  Administered 2013-03-17 – 2013-03-25 (×8): 81 mg via ORAL
  Filled 2013-03-17 (×8): qty 1

## 2013-03-17 MED ORDER — INSULIN ASPART 100 UNIT/ML ~~LOC~~ SOLN
0.0000 [IU] | SUBCUTANEOUS | Status: DC
  Administered 2013-03-17: 4 [IU] via SUBCUTANEOUS
  Administered 2013-03-19: 11 [IU] via SUBCUTANEOUS
  Administered 2013-03-19 (×3): 3 [IU] via SUBCUTANEOUS
  Administered 2013-03-19: 7 [IU] via SUBCUTANEOUS
  Administered 2013-03-19: 3 [IU] via SUBCUTANEOUS
  Administered 2013-03-20 (×2): 7 [IU] via SUBCUTANEOUS
  Administered 2013-03-20: 4 [IU] via SUBCUTANEOUS
  Administered 2013-03-20: 15 [IU] via SUBCUTANEOUS
  Administered 2013-03-20 (×2): 4 [IU] via SUBCUTANEOUS
  Administered 2013-03-20: 11 [IU] via SUBCUTANEOUS
  Administered 2013-03-21 (×3): 7 [IU] via SUBCUTANEOUS
  Administered 2013-03-21: 4 [IU] via SUBCUTANEOUS
  Administered 2013-03-21 – 2013-03-22 (×2): 7 [IU] via SUBCUTANEOUS
  Administered 2013-03-22 (×2): 4 [IU] via SUBCUTANEOUS
  Administered 2013-03-22 (×2): 7 [IU] via SUBCUTANEOUS
  Administered 2013-03-22: 4 [IU] via SUBCUTANEOUS
  Administered 2013-03-23: 3 [IU] via SUBCUTANEOUS
  Administered 2013-03-23: 4 [IU] via SUBCUTANEOUS
  Administered 2013-03-23: 11 [IU] via SUBCUTANEOUS
  Administered 2013-03-23: 7 [IU] via SUBCUTANEOUS
  Administered 2013-03-23 – 2013-03-24 (×2): 11 [IU] via SUBCUTANEOUS
  Administered 2013-03-24: 4 [IU] via SUBCUTANEOUS
  Administered 2013-03-24 (×2): 7 [IU] via SUBCUTANEOUS

## 2013-03-17 NOTE — Progress Notes (Signed)
    Subjective  - POD #2  Events of yesterday noted.  The patient is now intubated.   Physical Exam:  Patient alert  and uses verbal commands to communicate Intubated Doppler signals within the posterior tibial and anterior tibial arteries of the right foot which are biphasic. Dressing covers the right foot wound      Assessment/Plan:  POD #2  From a vascular perspective the patient's right leg is significantly improved.  He has good perfusion to the foot.  Over this will be adequate to heal the lateral ulcers.  The patient has no evidence of compartment syndrome.  He is on heparin for atrial fibrillation.  There have been no signs of bleeding from his recent operation.  BRABHAM IV, [name] 03/17/2013 10:51 AM --  Ceasar Mons Vitals:   03/17/13 0900  BP: 154/71  Pulse: 41  Temp:   Resp: 17    Intake/Output Summary (Last 24 hours) at 03/17/13 1051 Last data filed at 03/17/13 0800  Gross per 24 hour  Intake 1370.63 ml  Output   1030 ml  Net 340.63 ml     Laboratory CBC    Component Value Date/Time   WBC 9.0 03/17/2013 0300   HGB 9.7* 03/17/2013 0300   HCT 31.4* 03/17/2013 0300   PLT 151 03/17/2013 0300    BMET    Component Value Date/Time   NA 132* 03/17/2013 0300   K 4.7 03/17/2013 0300   CL 99 03/17/2013 0300   CO2 22 03/17/2013 0300   GLUCOSE 219* 03/17/2013 0300   BUN 31* 03/17/2013 0300   CREATININE 1.07 03/17/2013 0300   CALCIUM 7.8* 03/17/2013 0300   GFRNONAA 74* 03/17/2013 0300   GFRAA 85* 03/17/2013 0300    COAG Lab Results  Component Value Date   INR 1.90* 03/17/2013   INR 1.06 03/13/2013   No results found for this basename: PTT    Antibiotics Anti-infectives   Start     Dose/Rate Route Frequency Ordered Stop   03/15/13 0430  cefUROXime (ZINACEF) 1.5 g in dextrose 5 % 50 mL IVPB     1.5 g 100 mL/hr over 30 Minutes Intravenous Every 12 hours 03/15/13 0334 03/15/13 1643   03/14/13 2300  cefUROXime (ZINACEF) 1.5 g in dextrose 5 % 50  mL IVPB     1.5 g 100 mL/hr over 30 Minutes Intravenous To Surgery 03/14/13 2249 03/14/13 2330       V. Charlena Cross, M.D. [facility] Office: [phone] Pager:  [phone]

## 2013-03-17 NOTE — Progress Notes (Signed)
Occupational Therapy Discharge Patient Details Name: RACHIT GRIM MRN: [id number] DOB: 1950-12-06 Today's Date: 03/17/2013 Time:  -     Patient discharged from OT services secondary to medical decline - will need to re-order OT to resume therapy services.  Please see latest therapy progress note for current level of functioning and progress toward goals.    Progress and discharge plan discussed with patient and/or caregiver: Patient unable to participate in discharge planning and no caregivers available  GO     Jeani Hawking M [phone] 03/17/2013, 9:23 AM

## 2013-03-17 NOTE — Progress Notes (Signed)
eLink Physician-Brief Progress Note Patient Name: [name] [name] DOB: 1951-03-14 MRN: [id number]  Date of Service  03/17/2013   HPI/Events of Note   Family reports weak muscles with lipito  eICU Interventions  Dc lipitor, add as SE on MAR allergy list   Intervention Category Intermediate Interventions: Communication with other healthcare providers and/or family  [name]. 03/17/2013, 5:16 PM

## 2013-03-17 NOTE — Progress Notes (Signed)
Physical Therapy Discharge Patient Details Name: [name] [name] MRN: [id number] DOB: 06/15/1950 Today's Date: 03/17/2013 Time:  -     Patient discharged from PT services secondary to medical decline - will need to re-order PT to resume therapy services.  Please see latest therapy progress note for current level of functioning and progress toward goals.    Progress and discharge plan discussed with patient and/or caregiver: Patient unable to participate in discharge planning and no caregivers available  GP     Claudie Rathbone 03/17/2013, 8:19 AM  Pager [phone]

## 2013-03-17 NOTE — Progress Notes (Addendum)
VASCULAR LAB PRELIMINARY  ARTERIAL  ABI completed:    RIGHT    LEFT    PRESSURE WAVEFORM  PRESSURE WAVEFORM  BRACHIAL 146 Triphasic BRACHIAL 132 Triphasic  DP >300 Biphasic DP >300 Biphasic  PT >300 Biphasic PT 131 Biphasic    RIGHT LEFT  ABI N/A N/A   Technically limited due to room electrical noise interference for Doppler evaluation ABIs could not be ascertained secondary to incompressible vessels. Probable due to calcification   Avnoor Koury, RVS 03/17/2013, 2:17 PM

## 2013-03-17 NOTE — Significant Event (Signed)
Patient's spouse voiced great concern that patient was receiving Lipitor, stating that it causes muscle weakness. Patient and patient's family wanted it added as allergy. Notified CCM over at the black box and received an order to d/c Lipitor and will add Lipitor to allergy list. Patient and family updated at bedside. Morgan Keinath, Charity fundraiser.

## 2013-03-17 NOTE — Progress Notes (Signed)
ANTICOAGULATION CONSULT NOTE   Pharmacy Consult for heparin Indication: atrial fibrillation  Allergies  Allergen Reactions  . Lodine [Etodolac] Nausea And Vomiting  . Methotrexate Derivatives Nausea And Vomiting  . Ranitidine Hcl Nausea Only  . Remicade [Infliximab] Other (See Comments)    Chills and shakes     Patient Measurements: Height: 5\' 9"  (175.3 cm) Weight: 223 lb 1.7 oz (101.2 kg) IBW/kg (Calculated) : 70.7 Heparin Dosing Weight: 82 kg  Vital Signs: Temp: 97.9 F (36.6 C) (12/19 0800) Temp src: Oral (12/19 0800) BP: 154/71 mmHg (12/19 0900) Pulse Rate: 41 (12/19 0900)  Labs:  Recent Labs  03/15/13 1735 03/16/13 0240  03/16/13 0405 03/16/13 1244 03/16/13 1443 03/16/13 1456 03/16/13 1457 03/16/13 1508 03/16/13 2111 03/17/13 0300 03/17/13 0311  HGB  --  9.5*  --   --   --  10.5* 9.9*  --  9.5*  --  9.7*  --   HCT  --  30.5*  --   --   --  31.0* 32.4*  --  28.0*  --  31.4*  --   PLT  --  156  --   --   --   --  161  --   --   --  151  --   LABPROT  --   --   --   --   --   --   --   --   --   --  21.2*  --   INR  --   --   --   --   --   --   --   --   --   --  1.90*  --   HEPARINUNFRC 0.33 0.29*  --   --   --   --   --   --   --   --  0.40  --   CREATININE  --  1.04  --   --   --  1.50* 1.45*  --   --   --  1.07  --   CKTOTAL  --   --   --   --  812*  --   --   --   --   --   --   --   CKMB  --   --   --   --  2.6  --   --   --   --   --   --   --   TROPONINI  --   --   < > 0.58* 0.47*  --   --  0.44*  --  1.32*  --  0.91*  < > = values in this interval not displayed.  Estimated Creatinine Clearance: 86.1 ml/min (by C-G formula based on Cr of 1.07).  Assessment: 49 YOM with AFib, s/p femoral endarterectomy, continues on heparin.  Heparin level therapeutic at 0.4 today.  Warfarin started yesterday and INR is up to 1.9 from 1.06 at baseline.  Hemoglobin has slight downward trend.  No overt bleeding.  Goal of Therapy:  Heparin level 0.3-0.7  units/ml Monitor platelets by anticoagulation protocol: Yes INR 2-3   Plan:  1. Continue heparin at 1800units/hr 2. Check heparin level, INR, CBC with AM labs 3. Continue to monitor for signs and symptoms of bleeding 4. Warfarin 2.5mg  today  Celedonio Miyamoto, PharmD, BCPS Clinical Pharmacist Pager [phone]   03/17/2013 10:00 AM

## 2013-03-17 NOTE — Progress Notes (Deleted)
Patient Name: [name] [name] Date of Encounter: 03/17/2013  Principal Problem:   Shock Active Problems:   Peripheral vascular disease, unspecified   Acute respiratory failure   Embolism and thrombosis of iliac artery   Acute on chronic systolic CHF (congestive heart failure)   Diabetes mellitus type 2 with peripheral artery disease   CAD (coronary artery disease)   S/P CABG x 5   Bradycardia   Hypotension, unspecified   Atrial flutter    SUBJECTIVE  Intubated, awake.  Answering yes/no questions easily.  Reports chest tightness since yesterday.  CURRENT MEDS . antiseptic oral rinse  15 mL Mouth Rinse QID  . aspirin  81 mg Oral Daily  . atorvastatin  10 mg Per Tube q1800  . calcium-vitamin D  2 tablet Oral Q breakfast  . chlorhexidine  15 mL Mouth Rinse BID  . clobetasol cream  1 application Topical BID  . clopidogrel  75 mg Per Tube Q breakfast  . collagenase   Topical Daily  . docusate  100 mg Per Tube Daily  . ezetimibe  10 mg Per Tube Daily  . ferrous gluconate  324 mg Oral Q breakfast  . gabapentin  1,200 mg Oral QHS  . gabapentin  600 mg Oral Daily  . insulin aspart  0-20 Units Subcutaneous Q4H  . leflunomide  20 mg Oral Daily  . omega-3 acid ethyl esters  2 g Oral Daily  . pantoprazole (PROTONIX) IV  40 mg Intravenous Q24H  . predniSONE  5 mg Per Tube q12n4p  . sodium chloride  3 mL Intravenous Q12H  . Warfarin - Pharmacist Dosing Inpatient   Does not apply q1800    OBJECTIVE  Filed Vitals:   03/17/13 0800 03/17/13 0825 03/17/13 0852 03/17/13 0900  BP: 144/70 144/70  154/71  Pulse: 85 85 85 41  Temp: 97.9 F (36.6 C)     TempSrc: Oral     Resp: 12 17 14 17   Height:      Weight:      SpO2: 100% 100% 100% 100%    Intake/Output Summary (Last 24 hours) at 03/17/13 1004 Last data filed at 03/17/13 0800  Gross per 24 hour  Intake 1370.63 ml  Output   1030 ml  Net 340.63 ml   Filed Weights   03/15/13 0500 03/16/13 0354 03/17/13 0500  Weight:  216 lb 14.9 oz (98.4 kg) 220 lb 0.3 oz (99.8 kg) 223 lb 1.7 oz (101.2 kg)   CVP 16-17.  PHYSICAL EXAM  General: Pleasant, intubated, awake. Neuro: intubated, awake, following commands. Moves all extremities spontaneously. Psych: flat affect. HEENT:  Normal  Neck: Supple without bruits.  Difficult to assess jvp 2/2 girth. Lungs:  Resp regular and unlabored, scatt rhonchi. Heart: IR, IR no s3, s4, or murmurs. Abdomen: firm, protuberant, non-tender, hypoactive BS + x 4.  Extremities: No clubbing, cyanosis.  1+ bilat LE edema. DP/PT trace bilaterally.  Accessory Clinical Findings  CBC  Recent Labs  03/16/13 1456 03/16/13 1508 03/17/13 0300  WBC 13.4*  --  9.0  HGB 9.9* 9.5* 9.7*  HCT 32.4* 28.0* 31.4*  MCV 86.6  --  84.9  PLT 161  --  151   Basic Metabolic Panel  Recent Labs  03/15/13 0900 03/16/13 0240  03/16/13 1456 03/16/13 1508 03/17/13 0300  NA 140 133*  < > 134* 133* 132*  K 3.3* 4.1  < > 5.4* 5.4* 4.7  CL 100 97  < > 97  --  99  CO2 31 26  --  20  --  22  GLUCOSE 169* 187*  < > 173* 164* 219*  BUN 18 22  < > 25*  --  31*  CREATININE 0.92 1.04  < > 1.45*  --  1.07  CALCIUM 8.4 7.9*  --  7.8*  --  7.8*  MG  --  1.8  --   --   --   --   PHOS  --  4.1  --   --   --   --   < > = values in this interval not displayed.  Cardiac Enzymes  Recent Labs  03/16/13 0405 03/16/13 1244 03/16/13 1457 03/16/13 2111 03/17/13 0311  CKTOTAL  --  812*  --   --   --   CKMB  --  2.6  --   --   --   TROPONINI 0.58* 0.47* 0.44* 1.32* 0.91*   Lab Results  Component Value Date   INR 1.90* 03/17/2013   INR 1.06 03/13/2013    TELE  Afib/flutter, 80's to low 100's.  ECG  Aflutter, 89, rbbb.  Radiology/Studies  Ct Angio Chest Pe W/cm &/or Wo Cm  03/16/2013   CLINICAL DATA:  Decreased O2 saturation and bradycardia. Respiratory arrest.  EXAM: CT ANGIOGRAPHY CHEST WITH CONTRAST  TECHNIQUE: Multidetector CT imaging of the chest was performed using the standard  protocol during bolus administration of intravenous contrast. Multiplanar CT image reconstructions including MIPs were obtained to evaluate the vascular anatomy.  CONTRAST:  OMNIPAQUE IOHEXOL 350 MG/ML SOLN  COMPARISON:  Chest x-ray dated 03/16/2013  FINDINGS:IMPRESSION: 1. No pulmonary emboli. 2. Edema of the gallbladder wall with soft tissue stranding in the adjacent peritoneal fat suggesting the possibility of acute cholecystitis. 3. Tiny bilateral effusions with bilateral slight atelectasis.   Electronically Signed   By: Geanie Cooley M.D.   On: 03/16/2013 17:03   Dg Chest Port 1 View  03/17/2013   CLINICAL DATA:  Endotracheal tube placement  EXAM: PORTABLE CHEST - 1 VIEW  COMPARISON:  03/16/2013  FINDINGS: Endotracheal tube is again noted 15 mm above the carinal. This could be withdrawn slightly. A nasogastric catheter is noted within the stomach. A pacing device is again seen. Left central venous line is again noted. The lungs are clear. The cardiac shadow remains enlarged.  IMPRESSION: Support apparatus as described.  No acute abnormality is noted.   Electronically Signed   By: Alcide Clever M.D.   On: 03/17/2013 08:05   2D Echocardiogram 12.18.2014  Study Conclusions  - Left ventricle: The cavity size was mildly dilated. Wall   thickness was normal. Systolic function was severely   reduced. The estimated ejection fraction was in the range   of 20% to 25%. Diffuse hypokinesis. - Mitral valve: Mild regurgitation. - Left atrium: The atrium was mildly dilated. - Right ventricle: Systolic function was severely reduced. - Pericardium, extracardiac: A trivial pericardial effusion   was identified. Impressions:  - Small echodense area in posterior pericardial space; ?   loculated effusion or thrombus. _____________   ASSESSMENT AND PLAN  1.  Acute right lower extremity ischemia/PVD:  S/p right common femoral, produndofemoral, and superficial femoral endarterectomy with stenting of the  right common and external iliac arteries.  Currently on asa, plavix, heparin, coumadin.  2.  Acute respiratory failure/VDRF:  Per pulm.  3.  Acute hypotension/shock:  Pressor mgmt per critical care.  Antihypertensives on hold.  4.  CAD/chest tightness/Elevated troponin:  In setting of acute  shock/hypotension.  Troponin trend relatively flat.  Suspect demand ischemia.  Echo shows persistent LV dysfxn.  Cont asa/statin.  No bb in setting of #3.  5.  ICM/Chronic systolic chf: CVP's 16-17 on vasopressor therapy.  Weight, if accurate, is up 7 lbs since initial admission weight (though trend has been pretty variable).  Will have to follow closely to avoid over hydration and chf.  6.  Atrial flutter w/ RVR:  Rates currently stable.  As pressure stabilizes and pressors weaned, would hope to resume bb therapy.  Cont heparin/coumadin.  INR 1.9 this morning.  Probably dccv after 4 wks of therapeutic anticoagulation.  7.  DM:  Per CCM.  Signed, Nicolasa Ducking NP

## 2013-03-17 NOTE — Progress Notes (Signed)
TELEMETRY: Reviewed telemetry pt in atrial flutter with controlled ventricular response.: Filed Vitals:   03/17/13 0800 03/17/13 0825 03/17/13 0852 03/17/13 0900  BP: 144/70 144/70  154/71  Pulse: 85 85 85 41  Temp: 97.9 F (36.6 C)     TempSrc: Oral     Resp: 12 17 14 17   Height:      Weight:      SpO2: 100% 100% 100% 100%    Intake/Output Summary (Last 24 hours) at 03/17/13 1014 Last data filed at 03/17/13 0800  Gross per 24 hour  Intake 1370.63 ml  Output   1030 ml  Net 340.63 ml    SUBJECTIVE Patient awake, alert on vent. Events of yesterday noted. Patient with acute respiratory arrest associated with hypotension and bradycardia. Patient did complain of some chest pain preceding event and continues to note lower midsternal discomfort.   LABS: Basic Metabolic Panel:  Recent Labs  16/10/96 0900 03/16/13 0240  03/16/13 1456 03/16/13 1508 03/17/13 0300  NA 140 133*  < > 134* 133* 132*  K 3.3* 4.1  < > 5.4* 5.4* 4.7  CL 100 97  < > 97  --  99  CO2 31 26  --  20  --  22  GLUCOSE 169* 187*  < > 173* 164* 219*  BUN 18 22  < > 25*  --  31*  CREATININE 0.92 1.04  < > 1.45*  --  1.07  CALCIUM 8.4 7.9*  --  7.8*  --  7.8*  MG  --  1.8  --   --   --   --   PHOS  --  4.1  --   --   --   --   < > = values in this interval not displayed. Liver Function Tests: No results found for this basename: AST, ALT, ALKPHOS, BILITOT, PROT, ALBUMIN,  in the last 72 hours No results found for this basename: LIPASE, AMYLASE,  in the last 72 hours CBC:  Recent Labs  03/16/13 1456 03/16/13 1508 03/17/13 0300  WBC 13.4*  --  9.0  HGB 9.9* 9.5* 9.7*  HCT 32.4* 28.0* 31.4*  MCV 86.6  --  84.9  PLT 161  --  151   Cardiac Enzymes:  Recent Labs  03/16/13 0405 03/16/13 1244 03/16/13 1457 03/16/13 2111 03/17/13 0311  CKTOTAL  --  812*  --   --   --   CKMB  --  2.6  --   --   --   TROPONINI 0.58* 0.47* 0.44* 1.32* 0.91*    Radiology/Studies:  Ct Angio Chest Pe W/cm &/or Wo  Cm  03/16/2013   CLINICAL DATA:  Decreased O2 saturation and bradycardia. Respiratory arrest.  EXAM: CT ANGIOGRAPHY CHEST WITH CONTRAST  TECHNIQUE: Multidetector CT imaging of the chest was performed using the standard protocol during bolus administration of intravenous contrast. Multiplanar CT image reconstructions including MIPs were obtained to evaluate the vascular anatomy.  CONTRAST:  OMNIPAQUE IOHEXOL 350 MG/ML SOLN  COMPARISON:  Chest x-ray dated 03/16/2013  FINDINGS: RA/RV ratio is normal. There is borderline cardiomegaly. There are no pulmonary emboli. There are tiny bilateral pleural effusions with posterior atelectasis in the right upper and lower lobes and minimal atelectasis at the left lung base. Endotracheal tube is in good position.  There is a in 12 mm lymph node in the mediastinal fat just to the left of the aortic arch, nonspecific. No other mediastinal adenopathy. No hilar adenopathy.  There is  a small nodule along the right minor fissure consistent with an intrapulmonary lymph node measuring 7 x 4 x 3 mm.  No acute osseous abnormality.  Limited views of the upper abdomen demonstrate abnormal soft tissue stranding in the soft tissues it at the anterior aspect of the falciform ligament and there is a edema of the gallbladder wall. The possibility of acute cholecystitis should be considered.  Review of the MIP images confirms the above findings.  IMPRESSION: 1. No pulmonary emboli. 2. Edema of the gallbladder wall with soft tissue stranding in the adjacent peritoneal fat suggesting the possibility of acute cholecystitis. 3. Tiny bilateral effusions with bilateral slight atelectasis.   Electronically Signed   By: Geanie Cooley M.D.   On: 03/16/2013 17:03   Dg Chest Port 1 View  03/17/2013   CLINICAL DATA:  Endotracheal tube placement  EXAM: PORTABLE CHEST - 1 VIEW  COMPARISON:  03/16/2013  FINDINGS: Endotracheal tube is again noted 15 mm above the carinal. This could be withdrawn  slightly. A nasogastric catheter is noted within the stomach. A pacing device is again seen. Left central venous line is again noted. The lungs are clear. The cardiac shadow remains enlarged.  IMPRESSION: Support apparatus as described.  No acute abnormality is noted.   Electronically Signed   By: Alcide Clever M.D.   On: 03/17/2013 08:05   Dg Chest Port 1 View  03/16/2013   CLINICAL DATA:  Orogastric tube placement  EXAM: PORTABLE CHEST - 1 VIEW  COMPARISON:  Study obtained earlier in the day.  FINDINGS: Orogastric tube tip and side port are in the stomach. Endotracheal tube tip is 1.6 cm above the carina. Central catheter tip is at the junction of the left innominate vein and superior vena cava. Pacemaker leads are attached to the right atrium and right ventricle. No pneumothorax. There is a small left pleural effusion. Lungs are otherwise clear. Heart is enlarged with normal pulmonary vascularity. No adenopathy. Patient is status post internal mammary bypass grafting.  IMPRESSION: Tube and catheter positions as described without pneumothorax. Note that the orogastric tube tip and side port are in the proximal stomach. Small left pleural effusion. Lungs otherwise clear. Cardiomegaly, stable.   Electronically Signed   By: Bretta Bang M.D.   On: 03/16/2013 19:21   ECG: atrial flutter with controlled ventricular response. RBBB. ST-T wave abnormality c/w inferolateral ischemia. No acute change.  Echo:Study Conclusions  - Left ventricle: The cavity size was mildly dilated. Wall thickness was normal. Systolic function was severely reduced. The estimated ejection fraction was in the range of 20% to 25%. Diffuse hypokinesis. - Mitral valve: Mild regurgitation. - Left atrium: The atrium was mildly dilated. - Right ventricle: Systolic function was severely reduced. - Pericardium, extracardiac: A trivial pericardial effusion was identified.    PHYSICAL EXAM General: Well developed, obese, in no  acute distress. Head: Normocephalic, atraumatic, sclera non-icteric, no xanthomas, nares are without discharge. Neck: Negative for carotid bruits. JVD 8 cm. Lungs: Clear anteriorly to auscultation without wheezes, rales, or rhonchi. Breathing is unlabored. Heart: IRRR S1 S2 without murmurs, rubs, or gallops.  Abdomen: Soft, non-tender, obese with normoactive bowel sounds. No hepatomegaly. No rebound/guarding. No obvious abdominal masses. Msk:  Strength and tone appears normal for age. Extremities: No clubbing, cyanosis or edema.  Distal pedal pulses are 2+ and equal bilaterally. Neuro: Alert and oriented X 3. On vent. Moves all extremities spontaneously. Psych:  Responds to questions appropriately with a normal affect.  ASSESSMENT AND PLAN:  1. S/p arrest. Etiology is unclear. Possible coronary ischemia with already poor LV function. Troponin rise fairly unimpressive. ? Hypoventilation. Currently hemodynamics improved. Will wean pressors. All other vasoactive meds on hold.  2. CAD s/p CABG. Mild troponin leak consistent with demand ischemia with multiple stressors including acutely cold leg, hypotension, anesthesia, etc.  3. Acute on chronic systolic CHF. I reviewed Echo's personally. The Echo done yesterday looks slightly worse compared to earlier in the week. No effusion. Global hypokinesis. 4. Atrial flutter-rate currently controlled on no meds. Continue heparin. Transition to coumadin. 5. PAD s/p revascularization of RLE with endarterectomy and stents. Needs continued Plavix. 6. Acute respiratory failure- vent management per CCM.  7. Abnormal CT suggesting acute cholecytitis. Will check LFTs, amylase, lipase. 8. DM type 2.    Principal Problem:   Shock Active Problems:   Embolism and thrombosis of iliac artery   Peripheral vascular disease, unspecified   Acute on chronic systolic CHF (congestive heart failure)   Diabetes mellitus type 2 with peripheral artery disease   CAD (coronary  artery disease)   S/P CABG x 5   Bradycardia   Hypotension, unspecified   Acute respiratory failure   Atrial flutter    Signed, Peter [place] MD,FACC 03/17/2013 10:33 AM

## 2013-03-17 NOTE — Progress Notes (Signed)
[facility] notified of most recent elevation in troponin to 1.32. Pt currently stable, will continue to monitor.

## 2013-03-17 NOTE — Progress Notes (Signed)
PULMONARY  / CRITICAL CARE MEDICINE  Name: [name] [name] MRN: [id number] DOB: 03-13-1951    ADMISSION DATE:  03/13/2013 CONSULTATION DATE:  12/17    REFERRING MD :  Myra Gianotti PRIMARY SERVICE: [facility]VS  CHIEF COMPLAINT:  Ventilator management  BRIEF PATIENT DESCRIPTION:  53 M with extensive cardiac and PVD history presented 12/15 for scheduled arteriogram of BLEs and was found to be in [facility]. Admitted by Cardiology for mgmt with plan for arteriogram to be performed later in week. Developed acute arterial occlusion of RLE and underwent emergent R common femoral, profundofemoral, and superficial femoral endarterectomy with patch angioplasty. Remained intubated post op and PCCM asked to admit   SIGNIFICANT EVENTS:  12/15 adm with AFRVR  12/16 Acute RLE ischemia. To OR-- Emergent right common femoral, profundofemoral, and superficial femoral endarterectomy with patch angioplasty ([facility]) 12/18 respiratory arrest, reintubated  STUDIES: Echo 12/18 >> EF 20 to 25%, mild MR, mild LA dilation CT chest 12/18 >> no PE, tiny effusions with b/l ATX, 7 mm nodule Rt minor fissure, soft tissue stranding around falciform ligament with gallbladder edema  LINES / TUBES: ETT 12/16 >> 12/17 R radial A-line 12/16 >> ETT 12/18 >> Lt IJ CVL 12/18 >>   CULTURES: MRSA PCR 12/15 >> NEG  ANTIBIOTICS: Cefuroxime (peri-op) 12/16 >> 12/17   SUBJECTIVE:  Events of yesterday afternoon noted.  He reports feeling tired/fatigued, and could not keep up with his breathing.  VITAL SIGNS: Temp:  [97.4 F (36.3 C)-99.7 F (37.6 C)] 97.9 F (36.6 C) (12/19 0800) Pulse Rate:  [27-112] 85 (12/19 0852) Resp:  [11-27] 14 (12/19 0852) BP: (56-152)/(21-117) 144/70 mmHg (12/19 0825) SpO2:  [93 %-100 %] 100 % (12/19 0852) FiO2 (%):  [40 %-100 %] 40 % (12/19 0852) Weight:  [223 lb 1.7 oz (101.2 kg)] 223 lb 1.7 oz (101.2 kg) (12/19 0500) INTAKE / OUTPUT: Intake/Output     12/18 0701 - 12/19 0700 12/19 0701 - 12/20  0700   P.O. 360    I.V. (mL/kg) 1403.6 (13.9) 37 (0.4)   NG/GT 40 30   IV Piggyback     Total Intake(mL/kg) 1803.6 (17.8) 67 (0.7)   Urine (mL/kg/hr) 1025 (0.4) 100 (0.5)   Emesis/NG output     Total Output 1025 100   Net +778.6 -33          PHYSICAL EXAMINATION: General: no distress Neuro: RASS 0, follows commands, normal strength HEENT: ETT in place Cardiovascular: irregular, no murmur Lungs: scattered rhonchi, no wheeze Abdomen:  Soft, non tender, decreased bowel sounds, no guarding, no rebound Musculoskeletal: 1+ edema, lower extremities warm Skin: no rashes  LABS:  CBC  Recent Labs Lab 03/16/13 0240  03/16/13 1456 03/16/13 1508 03/17/13 0300  WBC 9.7  --  13.4*  --  9.0  HGB 9.5*  < > 9.9* 9.5* 9.7*  HCT 30.5*  < > 32.4* 28.0* 31.4*  PLT 156  --  161  --  151  < > = values in this interval not displayed. Coag's  Recent Labs Lab 03/13/13 1110 03/17/13 0300  APTT 24  --   INR 1.06 1.90*   BMET  Recent Labs Lab 03/16/13 0240 03/16/13 1443 03/16/13 1456 03/16/13 1508 03/17/13 0300  NA 133* 135 134* 133* 132*  K 4.1 4.7 5.4* 5.4* 4.7  CL 97 100 97  --  99  CO2 26  --  20  --  22  BUN 22 25* 25*  --  31*  CREATININE 1.04 1.50* 1.45*  --  1.07  GLUCOSE 187* 131* 173* 164* 219*   Electrolytes  Recent Labs Lab 03/16/13 0240 03/16/13 1456 03/17/13 0300  CALCIUM 7.9* 7.8* 7.8*  MG 1.8  --   --   PHOS 4.1  --   --    ABG  Recent Labs Lab 03/15/13 0137 03/15/13 0701 03/16/13 1457  PHART 7.458* 7.402 7.281*  PCO2ART 38.4 45.6* 50.2*  PO2ART 380.0* 100.0 251.0*   Liver Enzymes  Recent Labs Lab 03/13/13 1110  AST 8  ALT 16  ALKPHOS 67  BILITOT 0.3  ALBUMIN 3.2*   Cardiac Enzymes  Recent Labs Lab 03/14/13 0820 03/16/13 0405  03/16/13 1457 03/16/13 2111 03/17/13 0311  TROPONINI  --  0.58*  < > 0.44* 1.32* 0.91*  PROBNP 4169.0* 1650.0*  --   --   --   --   < > = values in this interval not displayed. Glucose  Recent  Labs Lab 03/16/13 1209 03/16/13 1750 03/16/13 2004 03/16/13 2346 03/17/13 0424 03/17/13 0809  GLUCAP 88 163* 165* 184* 206* 172*    Imaging: Ct Angio Chest Pe W/cm &/or Wo Cm  03/16/2013   CLINICAL DATA:  Decreased O2 saturation and bradycardia. Respiratory arrest.  EXAM: CT ANGIOGRAPHY CHEST WITH CONTRAST  TECHNIQUE: Multidetector CT imaging of the chest was performed using the standard protocol during bolus administration of intravenous contrast. Multiplanar CT image reconstructions including MIPs were obtained to evaluate the vascular anatomy.  CONTRAST:  OMNIPAQUE IOHEXOL 350 MG/ML SOLN  COMPARISON:  Chest x-ray dated 03/16/2013  FINDINGS: RA/RV ratio is normal. There is borderline cardiomegaly. There are no pulmonary emboli. There are tiny bilateral pleural effusions with posterior atelectasis in the right upper and lower lobes and minimal atelectasis at the left lung base. Endotracheal tube is in good position.  There is a in 12 mm lymph node in the mediastinal fat just to the left of the aortic arch, nonspecific. No other mediastinal adenopathy. No hilar adenopathy.  There is a small nodule along the right minor fissure consistent with an intrapulmonary lymph node measuring 7 x 4 x 3 mm.  No acute osseous abnormality.  Limited views of the upper abdomen demonstrate abnormal soft tissue stranding in the soft tissues it at the anterior aspect of the falciform ligament and there is a edema of the gallbladder wall. The possibility of acute cholecystitis should be considered.  Review of the MIP images confirms the above findings.  IMPRESSION: 1. No pulmonary emboli. 2. Edema of the gallbladder wall with soft tissue stranding in the adjacent peritoneal fat suggesting the possibility of acute cholecystitis. 3. Tiny bilateral effusions with bilateral slight atelectasis.   Electronically Signed   By: Geanie Cooley M.D.   On: 03/16/2013 17:03   Dg Chest Port 1 View  03/17/2013   CLINICAL DATA:   Endotracheal tube placement  EXAM: PORTABLE CHEST - 1 VIEW  COMPARISON:  03/16/2013  FINDINGS: Endotracheal tube is again noted 15 mm above the carinal. This could be withdrawn slightly. A nasogastric catheter is noted within the stomach. A pacing device is again seen. Left central venous line is again noted. The lungs are clear. The cardiac shadow remains enlarged.  IMPRESSION: Support apparatus as described.  No acute abnormality is noted.   Electronically Signed   By: Alcide Clever M.D.   On: 03/17/2013 08:05   Dg Chest Port 1 View  03/16/2013   CLINICAL DATA:  Orogastric tube placement  EXAM: PORTABLE CHEST - 1 VIEW  COMPARISON:  Study  obtained earlier in the day.  FINDINGS: Orogastric tube tip and side port are in the stomach. Endotracheal tube tip is 1.6 cm above the carina. Central catheter tip is at the junction of the left innominate vein and superior vena cava. Pacemaker leads are attached to the right atrium and right ventricle. No pneumothorax. There is a small left pleural effusion. Lungs are otherwise clear. Heart is enlarged with normal pulmonary vascularity. No adenopathy. Patient is status post internal mammary bypass grafting.  IMPRESSION: Tube and catheter positions as described without pneumothorax. Note that the orogastric tube tip and side port are in the proximal stomach. Small left pleural effusion. Lungs otherwise clear. Cardiomegaly, stable.   Electronically Signed   By: Bretta Bang M.D.   On: 03/16/2013 19:21   Dg Chest Portable 1 View  03/16/2013   CLINICAL DATA:  Central line and endotracheal tube placement.  EXAM: PORTABLE CHEST - 1 VIEW  COMPARISON:  03/16/2013.  FINDINGS: Endotracheal tube noted with its tip approximately 2.4 cm above the carina. Left IJ central line noted in good anatomic position. Cardiac pacer noted with lead tip projecting over right ventricle. Cardiomegaly. No pulmonary venous congestion. Prior CABG. Mild subsegmental atelectasis left lung base.   IMPRESSION: 1. Interim placement of endotracheal tube and left IJ line with good anatomic positioning.  2. Stable cardiomegaly.  Prior CABG.  Cardiac pacer.  3. Mild left base atelectasis versus infiltrate.   Electronically Signed   By: Maisie Fus  Register   On: 03/16/2013 15:33   Dg Chest Portable 1 View  03/16/2013   CLINICAL DATA:  Atelectasis.  EXAM: PORTABLE CHEST - 1 VIEW  COMPARISON:  03/15/2013.  FINDINGS: Cardiopericardial silhouette is enlarged. CABG markers are present along with median sternotomy wires. Endotracheal tube and enteric tube have been removed. AICD lead appears unchanged. Left basilar atelectasis appears similar to the prior exam. There is no airspace disease. No pleural effusion is identified. Monitoring leads project over the chest.  IMPRESSION: Cardiomegaly without failure. Interval removal of support apparatus.   Electronically Signed   By: Andreas Newport M.D.   On: 03/16/2013 07:57    ASSESSMENT / PLAN:  PULMONARY A: Acute respiratory failure >> ?secondary to generalized deconditioning and systolic heart failure in setting of acute stressor. Probable sleep disordered breathing. P:   F/u CXR, ABG Pressure support wean as tolerated >> not ready for extubation yet PRN BD's Would transition with CPAP/BiPAP after extubation  CARDIOVASCULAR A:  CAD, s/p CABG (2006). Chronic CHF. AFRVR >> off cardizem 12/18. Severe PVD. Hypotension. Acute RLE arterial occlusion - s/p emergent revasc 12/16. Respiratory arrest leading to cardiac arrest 12/18. P:  Wean off pressors to keep SBP > 90, MAP > 65 Rate control per cardiology Continue plavix, zetia per cardiology VVS managing post op issues Heparin/coumadin per VVS and cardiology Hold outpt coreg, imdur, diovan, hydralazine for now  RENAL A:   Hyponatremia - mild. P:   Monitor renal fx, urine outpt, electrolytes Keep foley in for now  GASTROINTESTINAL A:   Hx of GERD. Nutrition. ?gallbadder edema noted on CT  chest 12/18 >> Abd exam benign. P:   protonix for SUP F/u LFT's, lipase, RUQ u/s >> if negative then add tube feeds  HEMATOLOGIC A:   Anemia of chronic disease. P:  F/u CBC Transfuse for Hb < 7  INFECTIOUS A:   No issues P:   Monitor clinically  ENDOCRINE A:   DM type II. P:   SSI Hold outpt glucovance, januvia for  now  NEUROLOGIC A:   Post op pain P:   Versed, fentanyl while on vent Continue neurontin  RHEUMATOLOGY A: Hx of Rheumatoid arthritis P: Continue prednisone Hold outpt abatacept, leflunomide for now  D/w Dr. [place]  CC time 35 minutes  Coralyn Helling, MD [facility] Pulmonary/Critical Care 03/17/2013, 9:06 AM Pager:  [phone] After 3pm call: [phone]

## 2013-03-18 ENCOUNTER — Inpatient Hospital Stay (HOSPITAL_COMMUNITY): Payer: Medicare Other

## 2013-03-18 LAB — TYPE AND SCREEN
ABO/RH(D): A POS
Antibody Screen: NEGATIVE
Unit division: 0
Unit division: 0

## 2013-03-18 LAB — GLUCOSE, CAPILLARY
Glucose-Capillary: 53 mg/dL — ABNORMAL LOW (ref 70–99)
Glucose-Capillary: 63 mg/dL — ABNORMAL LOW (ref 70–99)
Glucose-Capillary: 125 mg/dL — ABNORMAL HIGH (ref 70–99)
Glucose-Capillary: 57 mg/dL — ABNORMAL LOW (ref 70–99)
Glucose-Capillary: 58 mg/dL — ABNORMAL LOW (ref 70–99)

## 2013-03-18 LAB — PROTIME-INR: Prothrombin Time: 20.4 seconds — ABNORMAL HIGH (ref 11.6–15.2)

## 2013-03-18 LAB — BASIC METABOLIC PANEL
BUN: 17 mg/dL (ref 6–23)
Chloride: 101 mEq/L (ref 96–112)
Creatinine, Ser: 0.66 mg/dL (ref 0.50–1.35)
GFR calc Af Amer: >90 mL/min (ref >90)
GFR calc non Af Amer: >90 mL/min (ref >90)
Glucose, Bld: 112 mg/dL — ABNORMAL HIGH (ref 70–99)
Sodium: 135 mEq/L (ref 135–145)

## 2013-03-18 LAB — CBC
HCT: 30.9 % — ABNORMAL LOW (ref 39.0–52.0)
Hemoglobin: 9.6 g/dL — ABNORMAL LOW (ref 13.0–17.0)
MCH: 26.3 pg (ref 26.0–34.0)
MCV: 84.7 fL (ref 78.0–100.0)
Platelets: 135 10*3/uL — ABNORMAL LOW (ref 150–400)
RBC: 3.65 MIL/uL — ABNORMAL LOW (ref 4.22–5.81)
WBC: 7.8 10*3/uL (ref 4.0–10.5)

## 2013-03-18 LAB — HEPARIN LEVEL (UNFRACTIONATED): Heparin Unfractionated: 0.44 IU/mL (ref 0.30–0.70)

## 2013-03-18 MED ORDER — DEXTROSE 50 % IV SOLN
INTRAVENOUS | Status: AC
  Administered 2013-03-18: 25 mL via INTRAVENOUS
  Filled 2013-03-18: qty 50

## 2013-03-18 MED ORDER — DEXTROSE 50 % IV SOLN
25.0000 mL | Freq: Once | INTRAVENOUS | Status: AC | PRN
  Administered 2013-03-18: 25 mL via INTRAVENOUS

## 2013-03-18 MED ORDER — GABAPENTIN 400 MG PO CAPS: 400.0000 mg | ORAL_CAPSULE | Freq: Every day | ORAL | Status: DC

## 2013-03-18 MED ORDER — WARFARIN SODIUM 5 MG PO TABS
5.0000 mg | ORAL_TABLET | Freq: Once | ORAL | Status: AC
  Administered 2013-03-18: 5 mg via ORAL
  Filled 2013-03-18: qty 1

## 2013-03-18 MED ORDER — DEXTROSE-NACL 5-0.45 % IV SOLN
INTRAVENOUS | Status: DC
  Administered 2013-03-18 – 2013-03-21 (×3): via INTRAVENOUS

## 2013-03-18 MED ORDER — DEXTROSE 50 % IV SOLN: 25.0000 mL | Freq: Once | INTRAVENOUS | Status: AC | PRN

## 2013-03-18 MED ORDER — VITAL AF 1.2 CAL PO LIQD
1000.0000 mL | ORAL | Status: DC
  Administered 2013-03-18: 1000 mL
  Filled 2013-03-18 (×2): qty 1000

## 2013-03-18 MED ORDER — VITAL HIGH PROTEIN PO LIQD
1000.0000 mL | ORAL | Status: DC
  Administered 2013-03-19: 1000 mL
  Filled 2013-03-18 (×4): qty 1000

## 2013-03-18 MED ORDER — PRO-STAT SUGAR FREE PO LIQD
60.0000 mL | Freq: Three times a day (TID) | ORAL | Status: DC
  Administered 2013-03-18 – 2013-03-19 (×6): 60 mL
  Filled 2013-03-18 (×9): qty 60

## 2013-03-18 MED ORDER — GABAPENTIN 400 MG PO CAPS
400.0000 mg | ORAL_CAPSULE | Freq: Two times a day (BID) | ORAL | Status: DC
  Administered 2013-03-18 – 2013-03-21 (×5): 400 mg via ORAL
  Filled 2013-03-18 (×7): qty 1

## 2013-03-18 MED ORDER — POTASSIUM PHOSPHATE DIBASIC 3 MMOLE/ML IV SOLN
10.0000 mmol | Freq: Once | INTRAVENOUS | Status: AC
  Administered 2013-03-18: 10 mmol via INTRAVENOUS
  Filled 2013-03-18: qty 3.33

## 2013-03-18 MED ORDER — DEXTROSE 50 % IV SOLN
INTRAVENOUS | Status: AC
  Administered 2013-03-18: 25 mL
  Filled 2013-03-18: qty 50

## 2013-03-18 MED ORDER — GABAPENTIN 400 MG PO CAPS
400.0000 mg | ORAL_CAPSULE | Freq: Two times a day (BID) | ORAL | Status: DC
  Filled 2013-03-18 (×2): qty 1

## 2013-03-18 MED ORDER — DEXTROSE 50 % IV SOLN
25.0000 mL | Freq: Once | INTRAVENOUS | Status: AC | PRN
  Administered 2013-03-18: 05:00:00 via INTRAVENOUS

## 2013-03-18 MED ORDER — FUROSEMIDE 10 MG/ML IJ SOLN
20.0000 mg | Freq: Once | INTRAMUSCULAR | Status: AC
  Administered 2013-03-18: 20 mg via INTRAVENOUS
  Filled 2013-03-18: qty 2

## 2013-03-18 NOTE — Progress Notes (Signed)
ANTICOAGULATION CONSULT NOTE   Pharmacy Consult for Heparin / Coumadin Indication: atrial fibrillation  Allergies  Allergen Reactions  . Lipitor [Atorvastatin]     Weak muscle  . Lodine [Etodolac] Nausea And Vomiting  . Methotrexate Derivatives Nausea And Vomiting  . Ranitidine Hcl Nausea Only  . Remicade [Infliximab] Other (See Comments)    Chills and shakes     Patient Measurements: Height: 5\' 9"  (175.3 cm) Weight: 229 lb 15 oz (104.3 kg) IBW/kg (Calculated) : 70.7 Heparin Dosing Weight: 82 kg  Vital Signs: Temp: 96.3 F (35.7 C) (12/20 0744) Temp src: Axillary (12/20 0744) BP: 111/82 mmHg (12/20 0925) Pulse Rate: 106 (12/20 0925)  Labs:  Recent Labs  03/16/13 0240  03/16/13 0405 03/16/13 1244  03/16/13 1456 03/16/13 1457 03/16/13 1508 03/16/13 2111 03/17/13 0300 03/17/13 0311 03/18/13 0500  HGB 9.5*  --   --   --   < > 9.9*  --  9.5*  --  9.7*  --  9.6*  HCT 30.5*  --   --   --   < > 32.4*  --  28.0*  --  31.4*  --  30.9*  PLT 156  --   --   --   --  161  --   --   --  151  --  135*  LABPROT  --   --   --   --   --   --   --   --   --  21.2*  --  20.4*  INR  --   --   --   --   --   --   --   --   --  1.90*  --  1.80*  HEPARINUNFRC 0.29*  --   --   --   --   --   --   --   --  0.40  --  0.44  CREATININE 1.04  --   --   --   < > 1.45*  --   --   --  1.07  --  0.66  CKTOTAL  --   --   --  812*  --   --   --   --   --   --   --   --   CKMB  --   --   --  2.6  --   --   --   --   --   --   --   --   TROPONINI  --   < > 0.58* 0.47*  --   --  0.44*  --  1.32*  --  0.91*  --   < > = values in this interval not displayed.  Estimated Creatinine Clearance: 116.8 ml/min (by C-G formula based on Cr of 0.66).  Assessment: 17 YOM with AFib, s/p femoral endarterectomy, continues on heparin.  Heparin level therapeutic at 0.44 today.  INR = 1.8  Goal of Therapy:  Heparin level 0.3-0.7 units/ml Monitor platelets by anticoagulation protocol: Yes INR 2-3   Plan:  1.  Continue heparin at 1800units/hr 2. Check heparin level, INR, CBC with AM labs 3. Continue to monitor for signs and symptoms of bleeding 4. Warfarin 5mg  today  Thank you. Okey Regal, PharmD [phone]   03/18/2013 11:02 AM

## 2013-03-18 NOTE — Progress Notes (Signed)
Patient ID: [name] [name], male   DOB: 1951-02-06, 62 y.o.   MRN: [id number] Subjective:  Awake but he remains intubated. ? Extubation plans  Objective:  Vital Signs in the last 24 hours: Temp:  [96.3 F (35.7 C)-98.6 F (37 C)] 97.7 F (36.5 C) (12/20 1125) Pulse Rate:  [42-111] 91 (12/20 1100) Resp:  [11-23] 14 (12/20 1100) BP: (111-137)/(51-88) 137/54 mmHg (12/20 1100) SpO2:  [97 %-100 %] 100 % (12/20 1100) FiO2 (%):  [40 %] 40 % (12/20 0949) Weight:  [229 lb 15 oz (104.3 kg)] 229 lb 15 oz (104.3 kg) (12/20 0600)  Intake/Output from previous day: 12/19 0701 - 12/20 0700 In: 857 [I.V.:713.7; NG/GT:143.3] Out: 1210 [Urine:1210] Intake/Output from this shift: Total I/O In: 200 [I.V.:90; NG/GT:110] Out: 110 [Urine:110]  Physical Exam: stable appearing NAD HEENT: Unremarkable Neck:  No JVD, no thyromegally Back:  No CVA tenderness Lungs:  Clear except for scattered rales HEART:  IRegular rate rhythm, no murmurs, no rubs, no clicks Abd:  Flat, positive bowel sounds, no organomegally, no rebound, no guarding Ext:  2 plus pulses, no edema, no cyanosis, no clubbing Skin:  No rashes no nodules Neuro:  CN II through XII intact, motor grossly intact  Lab Results:  Recent Labs  03/17/13 0300 03/18/13 0500  WBC 9.0 7.8  HGB 9.7* 9.6*  PLT 151 135*    Recent Labs  03/17/13 0300 03/18/13 0500  NA 132* 135  K 4.7 3.8  CL 99 101  CO2 22 27  GLUCOSE 219* 112*  BUN 31* 17  CREATININE 1.07 0.66    Recent Labs  03/16/13 2111 03/17/13 0311  TROPONINI 1.32* 0.91*   Hepatic Function Panel  Recent Labs  03/17/13 0915  PROT 5.5*  ALBUMIN 2.6*  AST 2541*  ALT 3524*  ALKPHOS 134*  BILITOT 1.0  BILIDIR 0.4*  IBILI 0.6   No results found for this basename: CHOL,  in the last 72 hours No results found for this basename: PROTIME,  in the last 72 hours  Imaging: Ct Angio Chest Pe W/cm &/or Wo Cm  03/16/2013   CLINICAL DATA:  Decreased O2 saturation and  bradycardia. Respiratory arrest.  EXAM: CT ANGIOGRAPHY CHEST WITH CONTRAST  TECHNIQUE: Multidetector CT imaging of the chest was performed using the standard protocol during bolus administration of intravenous contrast. Multiplanar CT image reconstructions including MIPs were obtained to evaluate the vascular anatomy.  CONTRAST:  OMNIPAQUE IOHEXOL 350 MG/ML SOLN  COMPARISON:  Chest x-ray dated 03/16/2013  FINDINGS: RA/RV ratio is normal. There is borderline cardiomegaly. There are no pulmonary emboli. There are tiny bilateral pleural effusions with posterior atelectasis in the right upper and lower lobes and minimal atelectasis at the left lung base. Endotracheal tube is in good position.  There is a in 12 mm lymph node in the mediastinal fat just to the left of the aortic arch, nonspecific. No other mediastinal adenopathy. No hilar adenopathy.  There is a small nodule along the right minor fissure consistent with an intrapulmonary lymph node measuring 7 x 4 x 3 mm.  No acute osseous abnormality.  Limited views of the upper abdomen demonstrate abnormal soft tissue stranding in the soft tissues it at the anterior aspect of the falciform ligament and there is a edema of the gallbladder wall. The possibility of acute cholecystitis should be considered.  Review of the MIP images confirms the above findings.  IMPRESSION: 1. No pulmonary emboli. 2. Edema of the gallbladder wall with soft tissue stranding  in the adjacent peritoneal fat suggesting the possibility of acute cholecystitis. 3. Tiny bilateral effusions with bilateral slight atelectasis.   Electronically Signed   By: Geanie Cooley M.D.   On: 03/16/2013 17:03   Dg Chest Port 1 View  03/18/2013   CLINICAL DATA:  Followup atelectasis.  EXAM: PORTABLE CHEST - 1 VIEW  COMPARISON:  03/17/2013  FINDINGS: Prior median sternotomy. Left pacer and endotracheal tube are unchanged. Cardiomegaly. Worsening vascular congestion and interstitial opacities, likely  interstitial edema. Suspect small bilateral effusions.  IMPRESSION: New mild interstitial edema.  Small bilateral effusions.   Electronically Signed   By: Charlett Nose M.D.   On: 03/18/2013 07:32   Dg Chest Port 1 View  03/17/2013   CLINICAL DATA:  Endotracheal tube placement  EXAM: PORTABLE CHEST - 1 VIEW  COMPARISON:  03/16/2013  FINDINGS: Endotracheal tube is again noted 15 mm above the carinal. This could be withdrawn slightly. A nasogastric catheter is noted within the stomach. A pacing device is again seen. Left central venous line is again noted. The lungs are clear. The cardiac shadow remains enlarged.  IMPRESSION: Support apparatus as described.  No acute abnormality is noted.   Electronically Signed   By: Alcide Clever M.D.   On: 03/17/2013 08:05   Dg Chest Port 1 View  03/16/2013   CLINICAL DATA:  Orogastric tube placement  EXAM: PORTABLE CHEST - 1 VIEW  COMPARISON:  Study obtained earlier in the day.  FINDINGS: Orogastric tube tip and side port are in the stomach. Endotracheal tube tip is 1.6 cm above the carina. Central catheter tip is at the junction of the left innominate vein and superior vena cava. Pacemaker leads are attached to the right atrium and right ventricle. No pneumothorax. There is a small left pleural effusion. Lungs are otherwise clear. Heart is enlarged with normal pulmonary vascularity. No adenopathy. Patient is status post internal mammary bypass grafting.  IMPRESSION: Tube and catheter positions as described without pneumothorax. Note that the orogastric tube tip and side port are in the proximal stomach. Small left pleural effusion. Lungs otherwise clear. Cardiomegaly, stable.   Electronically Signed   By: Bretta Bang M.D.   On: 03/16/2013 19:21   Dg Chest Portable 1 View  03/16/2013   CLINICAL DATA:  Central line and endotracheal tube placement.  EXAM: PORTABLE CHEST - 1 VIEW  COMPARISON:  03/16/2013.  FINDINGS: Endotracheal tube noted with its tip approximately  2.4 cm above the carina. Left IJ central line noted in good anatomic position. Cardiac pacer noted with lead tip projecting over right ventricle. Cardiomegaly. No pulmonary venous congestion. Prior CABG. Mild subsegmental atelectasis left lung base.  IMPRESSION: 1. Interim placement of endotracheal tube and left IJ line with good anatomic positioning.  2. Stable cardiomegaly.  Prior CABG.  Cardiac pacer.  3. Mild left base atelectasis versus infiltrate.   Electronically Signed   By: Maisie Fus  Register   On: 03/16/2013 15:33   US Abdomen Limited Ruq  03/17/2013   CLINICAL DATA:  Possible acute cholecystitis.  EXAM: US ABDOMEN LIMITED - RIGHT UPPER QUADRANT  COMPARISON:  None.  FINDINGS: Gallbladder  The gallbladder appears adequately distended. The gallbladder wall measures 2.5 mm which is within the limits of normal. There is no pericholecystic fluid or positive sonographic Murphy's sign. No stones are evident.  Common bile duct  Diameter: 5.8 mm.  Liver:  No focal lesion identified. Within normal limits in parenchymal echogenicity. There is no intrahepatic ductal dilation demonstrated.  IMPRESSION:  There is no evidence of acute cholecystitis nor of gallstones. The common bile duct and observed portions of the liver appear normal.   Electronically Signed   By: David  [place]   On: 03/17/2013 16:32    Cardiac Studies: Tele - atrial flutter with a CVR Assessment/Plan:  1. Atrial flutter with a CVR 2. VDRF - appears to be making progress. Would diurese as blood pressure tolerates. He is off of pressors. Etiology unclear, looks to me like hypoventilation 3. Shock - blood pressure is improved. Continue medical therapy 4. Acute/Chronic systolic CHF - he is tolerating beta blocker which can be uptitrated as blood pressure allows for his atrial flutter. He has back up pacing if needed.  LOS: 5 days    Chaske Paskett,M.D. 03/18/2013, 11:50 AM

## 2013-03-18 NOTE — Progress Notes (Signed)
Hypoglycemic Event  CBG: 53  Treatment: D50 IV 25 mL  Symptoms: None  Follow-up CBG: Time:0130 CBG Result:125  Possible Reasons for Event: Inadequate meal intake  Comments/MD notified:no    Jovian Lembcke, Lollie Marrow  Remember to initiate Hypoglycemia Order Set & complete

## 2013-03-18 NOTE — Progress Notes (Signed)
Hypoglycemic Event  CBG: 58  Treatment: D50 IV 25 mL  Symptoms: headache, sleepy  Follow-up CBG: Time:1146 CBG Result:112  Possible Reasons for Event: Inadequate meal intake  Comments/MD notified:Dr. Berneice Heinrich, Baldo Ash  Remember to initiate Hypoglycemia Order Set & complete

## 2013-03-18 NOTE — Progress Notes (Signed)
Hypoglycemic Event  CBG: 56  Treatment: dextrose 50 25 ml  Symptoms: Pale, sleepy  Follow-up CBG: Time:0805 CBG Result:121  Possible Reasons for Event: Inadequate meal intake  Comments/MD notified:CCM    [name] [name]  Remember to initiate Hypoglycemia Order Set & complete

## 2013-03-18 NOTE — Progress Notes (Signed)
Subjective: Interval History: none.Marland Kitchen alert and oriented on vent. Denies pain. Objective: Vital signs in last 24 hours: Temp:  [96.3 F (35.7 C)-98.6 F (37 C)] 96.3 F (35.7 C) (12/20 0744) Pulse Rate:  [42-111] 111 (12/20 0745) Resp:  [13-23] 20 (12/20 0745) BP: (105-137)/(51-88) 123/62 mmHg (12/20 0745) SpO2:  [97 %-100 %] 100 % (12/20 0745) FiO2 (%):  [40 %] 40 % (12/20 0745) Weight:  [229 lb 15 oz (104.3 kg)] 229 lb 15 oz (104.3 kg) (12/20 0600)  Intake/Output from previous day: 12/19 0701 - 12/20 0700 In: 857 [I.V.:713.7; NG/GT:143.3] Out: 1210 [Urine:1210] Intake/Output this shift:   Physical exam: Oriented on vent. Right groin healing without difficulty. Right foot warm with biphasic dorsalis pedis signal.  Lab Results:  Recent Labs  03/17/13 0300 03/18/13 0500  WBC 9.0 7.8  HGB 9.7* 9.6*  HCT 31.4* 30.9*  PLT 151 135*   BMET  Recent Labs  03/17/13 0300 03/18/13 0500  NA 132* 135  K 4.7 3.8  CL 99 101  CO2 22 27  GLUCOSE 219* 112*  BUN 31* 17  CREATININE 1.07 0.66  CALCIUM 7.8* 8.2*    Studies/Results: Ct Angio Chest Pe W/cm &/or Wo Cm  03/16/2013   CLINICAL DATA:  Decreased O2 saturation and bradycardia. Respiratory arrest.  EXAM: CT ANGIOGRAPHY CHEST WITH CONTRAST  TECHNIQUE: Multidetector CT imaging of the chest was performed using the standard protocol during bolus administration of intravenous contrast. Multiplanar CT image reconstructions including MIPs were obtained to evaluate the vascular anatomy.  CONTRAST:  OMNIPAQUE IOHEXOL 350 MG/ML SOLN  COMPARISON:  Chest x-ray dated 03/16/2013  FINDINGS: RA/RV ratio is normal. There is borderline cardiomegaly. There are no pulmonary emboli. There are tiny bilateral pleural effusions with posterior atelectasis in the right upper and lower lobes and minimal atelectasis at the left lung base. Endotracheal tube is in good position.  There is a in 12 mm lymph node in the mediastinal fat just to the left of  the aortic arch, nonspecific. No other mediastinal adenopathy. No hilar adenopathy.  There is a small nodule along the right minor fissure consistent with an intrapulmonary lymph node measuring 7 x 4 x 3 mm.  No acute osseous abnormality.  Limited views of the upper abdomen demonstrate abnormal soft tissue stranding in the soft tissues it at the anterior aspect of the falciform ligament and there is a edema of the gallbladder wall. The possibility of acute cholecystitis should be considered.  Review of the MIP images confirms the above findings.  IMPRESSION: 1. No pulmonary emboli. 2. Edema of the gallbladder wall with soft tissue stranding in the adjacent peritoneal fat suggesting the possibility of acute cholecystitis. 3. Tiny bilateral effusions with bilateral slight atelectasis.   Electronically Signed   By: Geanie Cooley M.D.   On: 03/16/2013 17:03   Dg Chest Port 1 View  03/18/2013   CLINICAL DATA:  Followup atelectasis.  EXAM: PORTABLE CHEST - 1 VIEW  COMPARISON:  03/17/2013  FINDINGS: Prior median sternotomy. Left pacer and endotracheal tube are unchanged. Cardiomegaly. Worsening vascular congestion and interstitial opacities, likely interstitial edema. Suspect small bilateral effusions.  IMPRESSION: New mild interstitial edema.  Small bilateral effusions.   Electronically Signed   By: Charlett Nose M.D.   On: 03/18/2013 07:32   Dg Chest Port 1 View  03/17/2013   CLINICAL DATA:  Endotracheal tube placement  EXAM: PORTABLE CHEST - 1 VIEW  COMPARISON:  03/16/2013  FINDINGS: Endotracheal tube is again noted 15  mm above the carinal. This could be withdrawn slightly. A nasogastric catheter is noted within the stomach. A pacing device is again seen. Left central venous line is again noted. The lungs are clear. The cardiac shadow remains enlarged.  IMPRESSION: Support apparatus as described.  No acute abnormality is noted.   Electronically Signed   By: Alcide Clever M.D.   On: 03/17/2013 08:05   Dg Chest  Port 1 View  03/16/2013   CLINICAL DATA:  Orogastric tube placement  EXAM: PORTABLE CHEST - 1 VIEW  COMPARISON:  Study obtained earlier in the day.  FINDINGS: Orogastric tube tip and side port are in the stomach. Endotracheal tube tip is 1.6 cm above the carina. Central catheter tip is at the junction of the left innominate vein and superior vena cava. Pacemaker leads are attached to the right atrium and right ventricle. No pneumothorax. There is a small left pleural effusion. Lungs are otherwise clear. Heart is enlarged with normal pulmonary vascularity. No adenopathy. Patient is status post internal mammary bypass grafting.  IMPRESSION: Tube and catheter positions as described without pneumothorax. Note that the orogastric tube tip and side port are in the proximal stomach. Small left pleural effusion. Lungs otherwise clear. Cardiomegaly, stable.   Electronically Signed   By: Bretta Bang M.D.   On: 03/16/2013 19:21   Dg Chest Portable 1 View  03/16/2013   CLINICAL DATA:  Central line and endotracheal tube placement.  EXAM: PORTABLE CHEST - 1 VIEW  COMPARISON:  03/16/2013.  FINDINGS: Endotracheal tube noted with its tip approximately 2.4 cm above the carina. Left IJ central line noted in good anatomic position. Cardiac pacer noted with lead tip projecting over right ventricle. Cardiomegaly. No pulmonary venous congestion. Prior CABG. Mild subsegmental atelectasis left lung base.  IMPRESSION: 1. Interim placement of endotracheal tube and left IJ line with good anatomic positioning.  2. Stable cardiomegaly.  Prior CABG.  Cardiac pacer.  3. Mild left base atelectasis versus infiltrate.   Electronically Signed   By: Maisie Fus  Register   On: 03/16/2013 15:33   Dg Chest Portable 1 View  03/16/2013   CLINICAL DATA:  Atelectasis.  EXAM: PORTABLE CHEST - 1 VIEW  COMPARISON:  03/15/2013.  FINDINGS: Cardiopericardial silhouette is enlarged. CABG markers are present along with median sternotomy wires.  Endotracheal tube and enteric tube have been removed. AICD lead appears unchanged. Left basilar atelectasis appears similar to the prior exam. There is no airspace disease. No pleural effusion is identified. Monitoring leads project over the chest.  IMPRESSION: Cardiomegaly without failure. Interval removal of support apparatus.   Electronically Signed   By: Andreas Newport M.D.   On: 03/16/2013 07:57   Dg Chest Port 1 View  03/15/2013   CLINICAL DATA:  Post right lower extremity vascular surgery  EXAM: PORTABLE CHEST - 1 VIEW  COMPARISON:  03/13/2013; 11/09/2006  FINDINGS: Grossly unchanged enlarged cardiac silhouette and mediastinal contours given a reduced lung volumes and patient rotation. Atherosclerotic plaque within the thoracic aorta. Post median sternotomy. Grossly stable position of left anterior chest wall single lead AICD/pacemaker. Interval intubation with endotracheal tube overlying the tracheal air column with tip approximately 2.5 cm above the carina. Enteric tube tip and side port projects below the left hemidiaphragm. Chronic pulmonary venous congestion without frank evidence of edema. Grossly unchanged left basilar opacities, likely atelectasis. No definite pleural effusion or pneumothorax. Unchanged bones.  IMPRESSION: 1. Appropriately positioned support apparatus as above. No pneumothorax. 2. Cardiomegaly and pulmonary venous congestion without  frank evidence of edema. 3. Unchanged left basilar atelectasis/scar.   Electronically Signed   By: Simonne Come M.D.   On: 03/15/2013 08:14   Dg Chest Port 1 View  03/13/2013   CLINICAL DATA:  CHF.  EXAM: PORTABLE CHEST - 1 VIEW  COMPARISON:  11/09/2006.  FINDINGS: Pacemaker enters from the left with leads in the region of the right ventricle. Battery pack limits evaluation of the left upper lung zone.  Post CABG.  Cardiomegaly.  Remote rib fractures.  No gross pneumothorax.  No segmental consolidation or pulmonary edema.  The patient would  eventually benefit from two view chest.  IMPRESSION: Pacemaker enters from the left with leads in the region of the right ventricle. Battery pack limits evaluation of the left upper lung zone.  Post CABG.  Cardiomegaly.  No segmental consolidation or pulmonary edema.   Electronically Signed   By: Bridgett Larsson M.D.   On: 03/13/2013 16:42   US Abdomen Limited Ruq  03/17/2013   CLINICAL DATA:  Possible acute cholecystitis.  EXAM: US ABDOMEN LIMITED - RIGHT UPPER QUADRANT  COMPARISON:  None.  FINDINGS: Gallbladder  The gallbladder appears adequately distended. The gallbladder wall measures 2.5 mm which is within the limits of normal. There is no pericholecystic fluid or positive sonographic Murphy's sign. No stones are evident.  Common bile duct  Diameter: 5.8 mm.  Liver:  No focal lesion identified. Within normal limits in parenchymal echogenicity. There is no intrahepatic ductal dilation demonstrated.  IMPRESSION: There is no evidence of acute cholecystitis nor of gallstones. The common bile duct and observed portions of the liver appear normal.   Electronically Signed   By: David  [place]   On: 03/17/2013 16:32   Anti-infectives: Anti-infectives   Start     Dose/Rate Route Frequency Ordered Stop   03/15/13 0430  cefUROXime (ZINACEF) 1.5 g in dextrose 5 % 50 mL IVPB     1.5 g 100 mL/hr over 30 Minutes Intravenous Every 12 hours 03/15/13 0334 03/15/13 1643   03/14/13 2300  cefUROXime (ZINACEF) 1.5 g in dextrose 5 % 50 mL IVPB     1.5 g 100 mL/hr over 30 Minutes Intravenous To Surgery 03/14/13 2249 03/14/13 2330      Assessment/Plan: s/p Procedure(s): PATCH ANGIOPLASTY Right Common Femoral, Superficial Femoral and Profunda-Femoral (Right) ABDOMINAL AORTAGRAM with right leg runoff.  Catheter in aorta times one (N/A) INSERTION OF ILIAC STENT Right Common and External  (Right) Stable from vascular standpoint. Vent management per critical care medicine.   LOS: 5 days   Addalynne Golding 03/18/2013, 9:11  AM

## 2013-03-18 NOTE — Progress Notes (Signed)
Hypoglycemic Event  CBG: 63  Treatment: D50 IV 25 mL  Symptoms: None  Follow-up CBG: Time:0440 CBG Result:105  Possible Reasons for Event: Inadequate meal intake  Comments/MD notified:yes    Brinley Treanor, Lollie Marrow  Remember to initiate Hypoglycemia Order Set & complete

## 2013-03-18 NOTE — Progress Notes (Signed)
NUTRITION FOLLOW-UP/CONSULT  DOCUMENTATION CODES Per approved criteria  -Obesity Unspecified   INTERVENTION: Initiate Vital High Protein formula via enteral feeding tube at 25 ml/hr, advance by 10 ml q 4 hours, to goal of 35 ml/hr. Add 60 ml Prostat liquid protein via tube TID. Goal regimen will provide 1440 kcals, 164 gm protein, 702 ml free water. RD to follow for nutrition care plan.  NUTRITION DIAGNOSIS: Inadequate oral intake related to inability to eat as evidenced by NPO status. Ongoing.  Goal: Initiate EN support within next 24-48 hours if prolonged intubation expected - met. Enteral nutrition to provide 60-70% of estimated calorie needs (22-25 kcals/kg ideal body weight) and 100% of estimated protein needs, based on ASPEN guidelines for permissive underfeeding in critically ill obese individuals.  Monitor:  EN initiation & tolerance, respiratory status, weight, labs, I/O's  ASSESSMENT: Patient with extensive cardiac and PVD history presented for scheduled arteriogram of BLEs and was found to be in AF RVR; developed acute arterial occlusion of RLE and underwent emergent surgery.  Patient s/p procedures 12/16 RIGHT COMMON FEMORAL, PROFUNDOFEMORAL AND SUPERFICIAL FEMORAL ENDARTERECTOMY  PATCH ANGIOPLASTY  Extubated 12/17. Pt developed shock and acute respiratory failure on 12/18. Code Blue called. Pt re-intubated 12/18.   RD consulted for EN initiation & management. Vital AF 1.2 formula ordered via Adult Tube Feeding Protocol. Vital AF 1.2 is currently hanging; discussed enteral regimen with RN.  Patient is currently intubated on ventilator support.  MV: 13 L/min Temp (24hrs), Avg:97.9 F (36.6 C), Min:96.3 F (35.7 C), Max:98.6 F (37 C)  Propofol: none  Labs reviewed: 3 low CBG's this morning: 63, 56, 53 Sodium and Magnesium WNL Phosphorus low at 2.1 and trending down  Height: Ht Readings from Last 1 Encounters:  03/13/13 5\' 9"  (1.753 m)    Weight: Wt  Readings from Last 1 Encounters:  03/18/13 229 lb 15 oz (104.3 kg)  Admit wt 216 lb  BMI:  Body mass index is 33.94 kg/(m^2). Obese Class I  Estimated Nutritional Needs: Kcal: 2144 Permissive Underfeeding Kcal Goal: 1285 - 1500 Protein: 145-165 gm Fluid: per MD  Skin: groin surgical incision   Diet Order:     Intake/Output Summary (Last 24 hours) at 03/18/13 0828 Last data filed at 03/18/13 0700  Gross per 24 hour  Intake 789.99 ml  Output   1110 ml  Net -320.01 ml    Last BM: 12/16  Labs:   Recent Labs Lab 03/15/13 0900 03/16/13 0240  03/16/13 1456 03/16/13 1508 03/17/13 0300 03/18/13 0500  NA 140 133*  < > 134* 133* 132* 135  K 3.3* 4.1  < > 5.4* 5.4* 4.7 3.8  CL 100 97  < > 97  --  99 101  CO2 31 26  --  20  --  22 27  BUN 18 22  < > 25*  --  31* 17  CREATININE 0.92 1.04  < > 1.45*  --  1.07 0.66  CALCIUM 8.4 7.9*  --  7.8*  --  7.8* 8.2*  MG  --  1.8  --   --   --   --  2.1  PHOS  --  4.1  --   --   --   --  2.1*  GLUCOSE 169* 187*  < > 173* 164* 219* 112*  < > = values in this interval not displayed.  CBG (last 3)   Recent Labs  03/18/13 0133 03/18/13 0411 03/18/13 0442  GLUCAP 125* 63* 105*  Scheduled Meds: . antiseptic oral rinse  15 mL Mouth Rinse QID  . aspirin  81 mg Oral Daily  . calcium-vitamin D  2 tablet Oral Q breakfast  . chlorhexidine  15 mL Mouth Rinse BID  . clobetasol cream  1 application Topical BID  . clopidogrel  75 mg Per Tube Q breakfast  . collagenase   Topical Daily  . docusate  100 mg Per Tube Daily  . ezetimibe  10 mg Per Tube Daily  . feeding supplement (VITAL AF 1.2 CAL)  1,000 mL Per Tube Q24H  . ferrous gluconate  324 mg Oral Q breakfast  . gabapentin  1,200 mg Oral QHS  . gabapentin  600 mg Oral Daily  . insulin aspart  0-20 Units Subcutaneous Q4H  . leflunomide  20 mg Oral Daily  . omega-3 acid ethyl esters  2 g Oral Daily  . pantoprazole (PROTONIX) IV  40 mg Intravenous Q24H  . predniSONE  5 mg Per  Tube q12n4p  . sodium chloride  3 mL Intravenous Q12H  . Warfarin - Pharmacist Dosing Inpatient   Does not apply q1800    Continuous Infusions: . diltiazem (CARDIZEM) infusion Stopped (03/16/13 0000)  . fentaNYL infusion INTRAVENOUS 160 mcg/hr (03/18/13 0700)  . heparin 1,800 Units/hr (03/18/13 0700)  . norepinephrine (LEVOPHED) Adult infusion Stopped (03/17/13 0400)  . phenylephrine (NEO-SYNEPHRINE) Adult infusion Stopped (03/16/13 1630)  . vasopressin (PITRESSIN) infusion - *FOR SHOCK* Stopped (03/17/13 1000)    Jarold Motto MS, RD, LDN Pager: [phone] After-hours pager: [phone]

## 2013-03-18 NOTE — Progress Notes (Signed)
PULMONARY  / CRITICAL CARE MEDICINE  Name: [name] [name] MRN: [id number] DOB: 03/29/1951    ADMISSION DATE:  03/13/2013 CONSULTATION DATE:  12/17    REFERRING MD :  Myra Gianotti PRIMARY SERVICE: VVS  CHIEF COMPLAINT:  Ventilator management  BRIEF PATIENT DESCRIPTION:  77 M with extensive cardiac and PVD history presented 12/15 for scheduled arteriogram of BLEs and was found to be in [facility]. Admitted by Cardiology for mgmt with plan for arteriogram to be performed later in week. Developed acute arterial occlusion of RLE and underwent emergent R common femoral, profundofemoral, and superficial femoral endarterectomy with patch angioplasty. Remained intubated post op and PCCM asked to admit   STUDIES: Echo 12/18 >> EF 20 to 25%, mild MR, mild LA dilation CT chest 12/18 >> no PE, tiny effusions with b/l ATX, 7 mm nodule Rt minor fissure, soft tissue stranding around falciform ligament with gallbladder edema  LINES / TUBES: ETT 12/16 >> 12/17 R radial A-line 12/16 >> ETT 12/18 >> Lt IJ CVL 12/18 >>   CULTURES: MRSA PCR 12/15 >> NEG  ANTIBIOTICS: Cefuroxime (peri-op) 12/16 >> 12/17     SIGNIFICANT EVENTS:  12/15 adm with AFRVR  12/16 Acute RLE ischemia. To OR-- Emergent right common femoral, profundofemoral, and superficial femoral endarterectomy with patch angioplasty ([facility]) 12/18 respiratory arrest, reintubated 12/19 : Events of yesterday afternoon noted.  He reports feeling tired/fatigued, and could not keep up with his breathing.   SUBJECTIVE/OVERNIGHT/INTERVAL HX 03/18/13: Sedated on fent gtt. 40% fio2.  On fent 100 gtt -> Normal wUA. On SBT had repeated low HR though he was writing notes Repated hypoglycemia  VITAL SIGNS: Temp:  [96.3 F (35.7 C)-98.6 F (37 C)] 97.7 F (36.5 C) (12/20 1125) Pulse Rate:  [42-111] 51 (12/20 1400) Resp:  [11-23] 14 (12/20 1400) BP: (111-138)/(51-88) 117/74 mmHg (12/20 1400) SpO2:  [97 %-100 %] 100 % (12/20 1400) FiO2 (%):  [30  %-40 %] 30 % (12/20 1159) Weight:  [104.3 kg (229 lb 15 oz)] 104.3 kg (229 lb 15 oz) (12/20 0600) INTAKE / OUTPUT: Intake/Output     12/19 0701 - 12/20 0700 12/20 0701 - 12/21 0700   P.O.     I.V. (mL/kg) 713.7 (6.8) 333.1 (3.2)   NG/GT 143.3 460   Total Intake(mL/kg) 857 (8.2) 793.1 (7.6)   Urine (mL/kg/hr) 1210 (0.5) 310 (0.4)   Total Output 1210 310   Net -353 +483.1          PHYSICAL EXAMINATION: General: no distress Neuro: RASS 0, follows commands, normal strength HEENT: ETT in place Cardiovascular: irregular, no murmur Lungs: scattered rhonchi, no wheeze Abdomen:  Soft, non tender, decreased bowel sounds, no guarding, no rebound Musculoskeletal: 1+ edema, lower extremities warm Skin: no rashes  LABS:  CBC  Recent Labs Lab 03/16/13 1456 03/16/13 1508 03/17/13 0300 03/18/13 0500  WBC 13.4*  --  9.0 7.8  HGB 9.9* 9.5* 9.7* 9.6*  HCT 32.4* 28.0* 31.4* 30.9*  PLT 161  --  151 135*   Coag's  Recent Labs Lab 03/13/13 1110 03/17/13 0300 03/18/13 0500  APTT 24  --   --   INR 1.06 1.90* 1.80*   BMET  Recent Labs Lab 03/16/13 1456 03/16/13 1508 03/17/13 0300 03/18/13 0500  NA 134* 133* 132* 135  K 5.4* 5.4* 4.7 3.8  CL 97  --  99 101  CO2 20  --  22 27  BUN 25*  --  31* 17  CREATININE 1.45*  --  1.07 0.66  GLUCOSE 173* 164* 219* 112*   Electrolytes  Recent Labs Lab 03/16/13 0240 03/16/13 1456 03/17/13 0300 03/18/13 0500  CALCIUM 7.9* 7.8* 7.8* 8.2*  MG 1.8  --   --  2.1  PHOS 4.1  --   --  2.1*   ABG  Recent Labs Lab 03/15/13 0137 03/15/13 0701 03/16/13 1457  PHART 7.458* 7.402 7.281*  PCO2ART 38.4 45.6* 50.2*  PO2ART 380.0* 100.0 251.0*   Liver Enzymes  Recent Labs Lab 03/13/13 1110 03/17/13 0915  AST 8 2541*  ALT 16 3524*  ALKPHOS 67 134*  BILITOT 0.3 1.0  ALBUMIN 3.2* 2.6*   Cardiac Enzymes  Recent Labs Lab 03/14/13 0820 03/16/13 0405  03/16/13 1457 03/16/13 2111 03/17/13 0311  TROPONINI  --  0.58*  < > 0.44*  1.32* 0.91*  PROBNP 4169.0* 1650.0*  --   --   --   --   < > = values in this interval not displayed. Glucose  Recent Labs Lab 03/17/13 2018 03/18/13 0033 03/18/13 0133 03/18/13 0411 03/18/13 0442 03/18/13 0741  GLUCAP 81 53* 125* 63* 105* 57*    Imaging: Ct Angio Chest Pe W/cm &/or Wo Cm  03/16/2013   CLINICAL DATA:  Decreased O2 saturation and bradycardia. Respiratory arrest.  EXAM: CT ANGIOGRAPHY CHEST WITH CONTRAST  TECHNIQUE: Multidetector CT imaging of the chest was performed using the standard protocol during bolus administration of intravenous contrast. Multiplanar CT image reconstructions including MIPs were obtained to evaluate the vascular anatomy.  CONTRAST:  OMNIPAQUE IOHEXOL 350 MG/ML SOLN  COMPARISON:  Chest x-ray dated 03/16/2013  FINDINGS: RA/RV ratio is normal. There is borderline cardiomegaly. There are no pulmonary emboli. There are tiny bilateral pleural effusions with posterior atelectasis in the right upper and lower lobes and minimal atelectasis at the left lung base. Endotracheal tube is in good position.  There is a in 12 mm lymph node in the mediastinal fat just to the left of the aortic arch, nonspecific. No other mediastinal adenopathy. No hilar adenopathy.  There is a small nodule along the right minor fissure consistent with an intrapulmonary lymph node measuring 7 x 4 x 3 mm.  No acute osseous abnormality.  Limited views of the upper abdomen demonstrate abnormal soft tissue stranding in the soft tissues it at the anterior aspect of the falciform ligament and there is a edema of the gallbladder wall. The possibility of acute cholecystitis should be considered.  Review of the MIP images confirms the above findings.  IMPRESSION: 1. No pulmonary emboli. 2. Edema of the gallbladder wall with soft tissue stranding in the adjacent peritoneal fat suggesting the possibility of acute cholecystitis. 3. Tiny bilateral effusions with bilateral slight atelectasis.    Electronically Signed   By: Geanie Cooley M.D.   On: 03/16/2013 17:03   Dg Chest Port 1 View  03/18/2013   CLINICAL DATA:  Followup atelectasis.  EXAM: PORTABLE CHEST - 1 VIEW  COMPARISON:  03/17/2013  FINDINGS: Prior median sternotomy. Left pacer and endotracheal tube are unchanged. Cardiomegaly. Worsening vascular congestion and interstitial opacities, likely interstitial edema. Suspect small bilateral effusions.  IMPRESSION: New mild interstitial edema.  Small bilateral effusions.   Electronically Signed   By: Charlett Nose M.D.   On: 03/18/2013 07:32   Dg Chest Port 1 View  03/17/2013   CLINICAL DATA:  Endotracheal tube placement  EXAM: PORTABLE CHEST - 1 VIEW  COMPARISON:  03/16/2013  FINDINGS: Endotracheal tube is again noted 15 mm above the carinal. This could be  withdrawn slightly. A nasogastric catheter is noted within the stomach. A pacing device is again seen. Left central venous line is again noted. The lungs are clear. The cardiac shadow remains enlarged.  IMPRESSION: Support apparatus as described.  No acute abnormality is noted.   Electronically Signed   By: Alcide Clever M.D.   On: 03/17/2013 08:05   Dg Chest Port 1 View  03/16/2013   CLINICAL DATA:  Orogastric tube placement  EXAM: PORTABLE CHEST - 1 VIEW  COMPARISON:  Study obtained earlier in the day.  FINDINGS: Orogastric tube tip and side port are in the stomach. Endotracheal tube tip is 1.6 cm above the carina. Central catheter tip is at the junction of the left innominate vein and superior vena cava. Pacemaker leads are attached to the right atrium and right ventricle. No pneumothorax. There is a small left pleural effusion. Lungs are otherwise clear. Heart is enlarged with normal pulmonary vascularity. No adenopathy. Patient is status post internal mammary bypass grafting.  IMPRESSION: Tube and catheter positions as described without pneumothorax. Note that the orogastric tube tip and side port are in the proximal stomach. Small left  pleural effusion. Lungs otherwise clear. Cardiomegaly, stable.   Electronically Signed   By: Bretta Bang M.D.   On: 03/16/2013 19:21   Dg Chest Portable 1 View  03/16/2013   CLINICAL DATA:  Central line and endotracheal tube placement.  EXAM: PORTABLE CHEST - 1 VIEW  COMPARISON:  03/16/2013.  FINDINGS: Endotracheal tube noted with its tip approximately 2.4 cm above the carina. Left IJ central line noted in good anatomic position. Cardiac pacer noted with lead tip projecting over right ventricle. Cardiomegaly. No pulmonary venous congestion. Prior CABG. Mild subsegmental atelectasis left lung base.  IMPRESSION: 1. Interim placement of endotracheal tube and left IJ line with good anatomic positioning.  2. Stable cardiomegaly.  Prior CABG.  Cardiac pacer.  3. Mild left base atelectasis versus infiltrate.   Electronically Signed   By: Maisie Fus  Register   On: 03/16/2013 15:33   US Abdomen Limited Ruq  03/17/2013   CLINICAL DATA:  Possible acute cholecystitis.  EXAM: US ABDOMEN LIMITED - RIGHT UPPER QUADRANT  COMPARISON:  None.  FINDINGS: Gallbladder  The gallbladder appears adequately distended. The gallbladder wall measures 2.5 mm which is within the limits of normal. There is no pericholecystic fluid or positive sonographic Murphy's sign. No stones are evident.  Common bile duct  Diameter: 5.8 mm.  Liver:  No focal lesion identified. Within normal limits in parenchymal echogenicity. There is no intrahepatic ductal dilation demonstrated.  IMPRESSION: There is no evidence of acute cholecystitis nor of gallstones. The common bile duct and observed portions of the liver appear normal.   Electronically Signed   By: David  [place]   On: 03/17/2013 16:32    ASSESSMENT / PLAN:  PULMONARY A: Acute respiratory failure >> ?secondary to generalized deconditioning and systolic heart failure in setting of acute stressor. Probable sleep disordered breathing.  03/18/13: Low RR on SBT - ? Due to low phos or  fentanyl or his OSA or some combo of these  P:   Pressure support wean as tolerated >> not ready for extubation yet PRN BD's Would transition with CPAP/BiPAP after extubation  CARDIOVASCULAR A:  CAD, s/p CABG (2006). Chronic CHF. AFRVR >> off cardizem 12/18. Severe PVD. Hypotension. Acute RLE arterial occlusion - s/p emergent revasc 12/16. Respiratory arrest leading to cardiac arrest 12/18.   P:  Lasix xx 1,. Check bnp Wean off  pressors to keep SBP > 90, MAP > 65 Rate control per cardiology Continue plavix, zetia per cardiology VVS managing post op issues Heparin/coumadin per VVS and cardiology Hold outpt coreg, imdur, diovan, hydralazine for now  RENAL A:   Hypophos - mild. P: Replete phos   Monitor renal fx, urine outpt, electrolytes Keep foley in for now  GASTROINTESTINAL A:   Hx of GERD. Nutrition. ?gallbadder edema noted on CT chest 12/18 >> Abd exam benign. P:   protonix for SUP F/u LFT's, lipase, RUQ u/s >> if negative then add tube feeds  HEMATOLOGIC A:   Anemia of chronic disease. P:  F/u CBC Transfuse for Hb < 7  INFECTIOUS A:   No issues P:   Monitor clinically  ENDOCRINE A:   DM type II. P:   SSI Hold outpt glucovance, januvia for now  NEUROLOGIC A:   Post op pain P:   Versed, fentanyl while on vent Continue neurontin but lower dose  RHEUMATOLOGY A: Hx of Rheumatoid arthritis P: Continue prednisone Hold outpt abatacept, leflunomide for now   The patient is critically ill with multiple organ systems failure and requires high complexity decision making for assessment and support, frequent evaluation and titration of therapies, application of advanced monitoring technologies and extensive interpretation of multiple databases.   Critical Care Time devoted to patient care services described in this note is  35  Minutes.  Dr. Kalman Shan, M.D., [facility].C.P Pulmonary and Critical Care Medicine Staff Physician [place]  [facility] Pager: [phone], If no answer or between  15:00h - 7:00h: call [phone]  03/18/2013 2:34 PM

## 2013-03-19 ENCOUNTER — Inpatient Hospital Stay (HOSPITAL_COMMUNITY): Payer: Medicare Other

## 2013-03-19 LAB — BASIC METABOLIC PANEL
BUN: 21 mg/dL (ref 6–23)
CO2: 27 mEq/L (ref 19–32)
Calcium: 8.4 mg/dL (ref 8.4–10.5)
GFR calc Af Amer: >90 mL/min (ref >90)
GFR calc non Af Amer: >90 mL/min (ref >90)
Glucose, Bld: 166 mg/dL — ABNORMAL HIGH (ref 70–99)
Potassium: 3.9 mEq/L (ref 3.5–5.1)
Sodium: 135 mEq/L (ref 135–145)

## 2013-03-19 LAB — GLUCOSE, CAPILLARY
Glucose-Capillary: 112 mg/dL — ABNORMAL HIGH (ref 70–99)
Glucose-Capillary: 150 mg/dL — ABNORMAL HIGH (ref 70–99)
Glucose-Capillary: 252 mg/dL — ABNORMAL HIGH (ref 70–99)
Glucose-Capillary: 263 mg/dL — ABNORMAL HIGH (ref 70–99)

## 2013-03-19 LAB — POCT I-STAT 3, ART BLOOD GAS (G3+)
Acid-Base Excess: 3 mmol/L — ABNORMAL HIGH (ref 0.0–2.0)
Acid-Base Excess: 5 mmol/L — ABNORMAL HIGH (ref 0.0–2.0)
Bicarbonate: 28.3 mEq/L — ABNORMAL HIGH (ref 20.0–24.0)
Bicarbonate: 28.5 mEq/L — ABNORMAL HIGH (ref 20.0–24.0)
O2 Saturation: 99 %
O2 Saturation: 99 %
Patient temperature: 98.2
Patient temperature: 98.2
TCO2: 30 mmol/L (ref 0–100)
TCO2: 30 mmol/L (ref 0–100)
pCO2 arterial: 38.2 mmHg (ref 35.0–45.0)
pH, Arterial: 7.396 (ref 7.350–7.450)
pO2, Arterial: 157 mmHg — ABNORMAL HIGH (ref 80.0–100.0)
pO2, Arterial: 132 mmHg — ABNORMAL HIGH (ref 80.0–100.0)

## 2013-03-19 LAB — CBC WITH DIFFERENTIAL/PLATELET
Eosinophils Relative: 1 % (ref 0–5)
HCT: 31.9 % — ABNORMAL LOW (ref 39.0–52.0)
Hemoglobin: 9.9 g/dL — ABNORMAL LOW (ref 13.0–17.0)
Lymphocytes Relative: 13 % (ref 12–46)
Lymphs Abs: 1.1 10*3/uL (ref 0.7–4.0)
MCV: 86 fL (ref 78.0–100.0)
Monocytes Absolute: 0.9 10*3/uL (ref 0.1–1.0)
Monocytes Relative: 11 % (ref 3–12)
Neutro Abs: 6 10*3/uL (ref 1.7–7.7)
Platelets: 125 10*3/uL — ABNORMAL LOW (ref 150–400)
RBC: 3.71 MIL/uL — ABNORMAL LOW (ref 4.22–5.81)
RDW: 17 % — ABNORMAL HIGH (ref 11.5–15.5)
WBC: 8.1 10*3/uL (ref 4.0–10.5)

## 2013-03-19 LAB — MAGNESIUM: Magnesium: 2 mg/dL (ref 1.5–2.5)

## 2013-03-19 LAB — PROTIME-INR: Prothrombin Time: 21 seconds — ABNORMAL HIGH (ref 11.6–15.2)

## 2013-03-19 LAB — PRO B NATRIURETIC PEPTIDE: Pro B Natriuretic peptide (BNP): 2143 pg/mL — ABNORMAL HIGH (ref 0–125)

## 2013-03-19 MED ORDER — FUROSEMIDE 10 MG/ML IJ SOLN
40.0000 mg | Freq: Three times a day (TID) | INTRAMUSCULAR | Status: DC
  Administered 2013-03-19 – 2013-03-21 (×6): 40 mg via INTRAVENOUS
  Filled 2013-03-19 (×10): qty 4

## 2013-03-19 MED ORDER — POTASSIUM PHOSPHATE DIBASIC 3 MMOLE/ML IV SOLN
10.0000 mmol | Freq: Once | INTRAVENOUS | Status: AC
  Administered 2013-03-19: 10 mmol via INTRAVENOUS
  Filled 2013-03-19: qty 3.33

## 2013-03-19 MED ORDER — FENTANYL CITRATE 0.05 MG/ML IJ SOLN
12.5000 ug | INTRAMUSCULAR | Status: DC | PRN
  Administered 2013-03-20 (×3): 50 ug via INTRAVENOUS
  Filled 2013-03-19 (×4): qty 2

## 2013-03-19 MED ORDER — WARFARIN SODIUM 5 MG PO TABS
5.0000 mg | ORAL_TABLET | Freq: Once | ORAL | Status: AC
  Administered 2013-03-19: 5 mg via ORAL
  Filled 2013-03-19: qty 1

## 2013-03-19 NOTE — Progress Notes (Signed)
Attempted to wean pt at 08:00. Pt has low RR and shows no pt effort at times. Will attempt to wean again later this morning.

## 2013-03-19 NOTE — Progress Notes (Signed)
Report called from xray to advance OG tube. Upon assessment when auscultation was preformed nothing heard to confirm placement on assessment of OG, tube was advanced approximately 10cm, and auscultation re preformed and placement was  Confirmed.

## 2013-03-19 NOTE — Progress Notes (Signed)
ANTICOAGULATION CONSULT NOTE   Pharmacy Consult for Heparin / Coumadin Indication: atrial fibrillation  Allergies  Allergen Reactions  . Lipitor [Atorvastatin]     Weak muscle  . Lodine [Etodolac] Nausea And Vomiting  . Methotrexate Derivatives Nausea And Vomiting  . Ranitidine Hcl Nausea Only  . Remicade [Infliximab] Other (See Comments)    Chills and shakes     Patient Measurements: Height: 5\' 9"  (175.3 cm) Weight: 230 lb 6.1 oz (104.5 kg) IBW/kg (Calculated) : 70.7 Heparin Dosing Weight: 82 kg  Vital Signs: Temp: 98.1 F (36.7 C) (12/21 1133) Temp src: Oral (12/21 1133) BP: 128/113 mmHg (12/21 1300) Pulse Rate: 98 (12/21 1300)  Labs:  Recent Labs  03/16/13 1457  03/16/13 2111 03/17/13 0300 03/17/13 0311 03/18/13 0500 03/19/13 0430  HGB  --   < >  --  9.7*  --  9.6* 9.9*  HCT  --   < >  --  31.4*  --  30.9* 31.9*  PLT  --   --   --  151  --  135* 125*  LABPROT  --   --   --  21.2*  --  20.4* 21.0*  INR  --   --   --  1.90*  --  1.80* 1.87*  HEPARINUNFRC  --   --   --  0.40  --  0.44 0.36  CREATININE  --   --   --  1.07  --  0.66 0.64  TROPONINI 0.44*  --  1.32*  --  0.91*  --   --   < > = values in this interval not displayed.  Estimated Creatinine Clearance: 116.9 ml/min (by C-G formula based on Cr of 0.64).  Assessment: 89 YOM with AFib, s/p femoral endarterectomy, continues on heparin and warfarin.  Heparin level therapeutic at 0.36 today.  INR = 1.87.  CBC stable.  Goal of Therapy:  Heparin level 0.3-0.7 units/ml Monitor platelets by anticoagulation protocol: Yes INR 2-3   Plan:  1. Continue heparin at 1800units/hr 2. Check heparin level, INR, CBC with AM labs 3. Continue to monitor for signs and symptoms of bleeding 4. Warfarin 5mg  today  Thank you. Celedonio Miyamoto, PharmD, BCPS Clinical Pharmacist Pager [phone]    03/19/2013 2:10 PM

## 2013-03-19 NOTE — Progress Notes (Signed)
Patient ID: [name] [name], male   DOB: March 26, 1951, 62 y.o.   MRN: [id number]  Subjective:  Awake but he remains intubated. C/o pain with ETT.   Objective:  Vital Signs in the last 24 hours: Temp:  [97.6 F (36.4 C)-98.6 F (37 C)] 98.1 F (36.7 C) (12/21 1133) Pulse Rate:  [33-133] 55 (12/21 1400) Resp:  [9-23] 15 (12/21 1400) BP: (78-137)/(51-113) 128/66 mmHg (12/21 1400) SpO2:  [97 %-100 %] 99 % (12/21 1400) FiO2 (%):  [30 %-40 %] 30 % (12/21 1400) Weight:  [230 lb 6.1 oz (104.5 kg)] 230 lb 6.1 oz (104.5 kg) (12/21 0600)  Intake/Output from previous day: 12/20 0701 - 12/21 0700 In: 2783.9 [I.V.:1610.6; NG/GT:920; IV Piggyback:253.3] Out: 2195 [Urine:2195] Intake/Output from this shift: Total I/O In: 840.5 [I.V.:415.5; NG/GT:425] Out: 375 [Urine:375]  Physical Exam: stable appearing with ETT in place, NAD HEENT: Unremarkable Neck:  No JVD, no thyromegally Back:  No CVA tenderness Lungs:  Clear except for scattered rales HEART:  IRegular rate rhythm, no murmurs, no rubs, no clicks Abd:  Flat, positive bowel sounds, no organomegally, no rebound, no guarding Ext:  2 plus pulses, no edema, no cyanosis, no clubbing Skin:  No rashes no nodules Neuro:  CN II through XII intact, motor grossly intact  Lab Results:  Recent Labs  03/18/13 0500 03/19/13 0430  WBC 7.8 8.1  HGB 9.6* 9.9*  PLT 135* 125*    Recent Labs  03/18/13 0500 03/19/13 0430  NA 135 135  K 3.8 3.9  CL 101 99  CO2 27 27  GLUCOSE 112* 166*  BUN 17 21  CREATININE 0.66 0.64    Recent Labs  03/16/13 2111 03/17/13 0311  TROPONINI 1.32* 0.91*   Hepatic Function Panel  Recent Labs  03/17/13 0915  PROT 5.5*  ALBUMIN 2.6*  AST 2541*  ALT 3524*  ALKPHOS 134*  BILITOT 1.0  BILIDIR 0.4*  IBILI 0.6   No results found for this basename: CHOL,  in the last 72 hours No results found for this basename: PROTIME,  in the last 72 hours  Imaging: Dg Chest Port 1 View  03/19/2013    CLINICAL DATA:  Intubation.  EXAM: PORTABLE CHEST - 1 VIEW  COMPARISON:  03/18/2013.  FINDINGS: Endotracheal tube in stable position. The NG tube has been withdrawn, its tip is now in the this distal esophagus. Repositioning should be considered. Left IJ line in stable position. Cardiac pacer in stable position. Previously identified interstitial pulmonary edema and pleural effusions have almost completely cleared. Cardiomegaly with normal pulmonary vascularity. No pneumothorax or acute osseous abnormality.  IMPRESSION: 1. Interim partial withdrawal of NG tube, its tip is in the esophagus. This report was phoned to the patient's ICU nurse at 7:42 a.m. on 03/19/2013. 2. Interim near complete clearing of pulmonary interstitial edema and pleural effusions.   Electronically Signed   By: Maisie Fus  Register   On: 03/19/2013 07:43   Dg Chest Port 1 View  03/18/2013   CLINICAL DATA:  Followup atelectasis.  EXAM: PORTABLE CHEST - 1 VIEW  COMPARISON:  03/17/2013  FINDINGS: Prior median sternotomy. Left pacer and endotracheal tube are unchanged. Cardiomegaly. Worsening vascular congestion and interstitial opacities, likely interstitial edema. Suspect small bilateral effusions.  IMPRESSION: New mild interstitial edema.  Small bilateral effusions.   Electronically Signed   By: Charlett Nose M.D.   On: 03/18/2013 07:32   US Abdomen Limited Ruq  03/17/2013   CLINICAL DATA:  Possible acute cholecystitis.  EXAM: US ABDOMEN  LIMITED - RIGHT UPPER QUADRANT  COMPARISON:  None.  FINDINGS: Gallbladder  The gallbladder appears adequately distended. The gallbladder wall measures 2.5 mm which is within the limits of normal. There is no pericholecystic fluid or positive sonographic Murphy's sign. No stones are evident.  Common bile duct  Diameter: 5.8 mm.  Liver:  No focal lesion identified. Within normal limits in parenchymal echogenicity. There is no intrahepatic ductal dilation demonstrated.  IMPRESSION: There is no evidence of acute  cholecystitis nor of gallstones. The common bile duct and observed portions of the liver appear normal.   Electronically Signed   By: David  [place]   On: 03/17/2013 16:32    Cardiac Studies: Tele - atrial flutter with a CVR Assessment/Plan:  1. Atrial flutter with a CVR 2. VDRF - appears to be making progress. Would diurese as blood pressure tolerates. He is off of pressors. Etiology unclear, looks to me like hypoventilation 3. Shock - blood pressure is improved. Continue medical therapy 4. Acute/Chronic systolic CHF - he is tolerating beta blocker which can be uptitrated as blood pressure allows for his atrial flutter. He has back up pacing if needed. Will attempt to diurese.  LOS: 6 days    Gregg Taylor,M.D. 03/19/2013, 2:50 PM

## 2013-03-19 NOTE — Progress Notes (Signed)
Patient ID: [name] [name], male   DOB: 24-Nov-1950, 62 y.o.   MRN: [id number] Alert and stable on the vent. Denies any pain Right groin incision healing without difficulty Adequate flow to both lower extremities. Plan per critical care medicine

## 2013-03-19 NOTE — Progress Notes (Signed)
PULMONARY  / CRITICAL CARE MEDICINE  Name: [name] [name] MRN: [id number] DOB: 1951-02-21    ADMISSION DATE:  03/13/2013 CONSULTATION DATE:  12/17    REFERRING MD :  Myra Gianotti PRIMARY SERVICE: VVS  CHIEF COMPLAINT:  Ventilator management  BRIEF PATIENT DESCRIPTION:  88 M with extensive cardiac and PVD history presented 12/15 for scheduled arteriogram of BLEs and was found to be in [facility]. Admitted by Cardiology for mgmt with plan for arteriogram to be performed later in week. Developed acute arterial occlusion of RLE and underwent emergent R common femoral, profundofemoral, and superficial femoral endarterectomy with patch angioplasty. Remained intubated post op and PCCM asked to admit   STUDIES: Echo 12/18 >> EF 20 to 25%, mild MR, mild LA dilation CT chest 12/18 >> no PE, tiny effusions with b/l ATX, 7 mm nodule Rt minor fissure, soft tissue stranding around falciform ligament with gallbladder edema  LINES / TUBES: ETT 12/16 >> 12/17 R radial A-line 12/16 >> ETT 12/18 >> Lt IJ CVL 12/18 >>   CULTURES: MRSA PCR 12/15 >> NEG  ANTIBIOTICS: Cefuroxime (peri-op) 12/16 >> 12/17     SIGNIFICANT EVENTS:  12/15 adm with AFRVR  12/16 Acute RLE ischemia. To OR-- Emergent right common femoral, profundofemoral, and superficial femoral endarterectomy with patch angioplasty (Brabham) 12/18 respiratory arrest, reintubated. EF 25% oN ECHO 12/19 : Events of yesterday afternoon noted.  He reports feeling tired/fatigued, and could not keep up with his breathing. 03/18/13: Sedated on fent gtt. 40% fio2.  On fent 100 gtt -> Normal wUA. On SBT had repeated low HR though he was writing notes Repated hypoglycemia    SUBJECTIVE/OVERNIGHT/INTERVAL HX 03/19/13 - no more hypoglycemia. Off fentanyl 8am to 13.30 -> still setting off apnea alarms while on SBT. During this time he is watching TV and reading and writing. RN reports anasarca. Positve 1.5L since admit 03/13/2013   VITAL  SIGNS: Temp:  [97.6 F (36.4 C)-98.6 F (37 C)] 98.1 F (36.7 C) (12/21 1133) Pulse Rate:  [33-133] 98 (12/21 1300) Resp:  [9-23] 14 (12/21 1300) BP: (78-137)/(51-113) 128/113 mmHg (12/21 1300) SpO2:  [97 %-100 %] 98 % (12/21 1300) FiO2 (%):  [30 %-40 %] 30 % (12/21 1300) Weight:  [104.5 kg (230 lb 6.1 oz)] 104.5 kg (230 lb 6.1 oz) (12/21 0600) INTAKE / OUTPUT: Intake/Output     12/20 0701 - 12/21 0700 12/21 0701 - 12/22 0700   I.V. (mL/kg) 1610.6 (15.4) 415.5 (4)   NG/GT 920 425   IV Piggyback 253.3    Total Intake(mL/kg) 2783.9 (26.6) 840.5 (8)   Urine (mL/kg/hr) 2195 (0.9) 375 (0.5)   Total Output 2195 375   Net +588.9 +465.5          PHYSICAL EXAMINATION: General: no distress Neuro: RASS 0, follows commands, normal strength HEENT: ETT in place Cardiovascular: irregular, no murmur Lungs: scattered rhonchi, no wheeze Abdomen:  Soft, non tender, decreased bowel sounds, no guarding, no rebound Musculoskeletal: 1+ edema, lower extremities warm Skin: no rashes  LABS:  CBC  Recent Labs Lab 03/17/13 0300 03/18/13 0500 03/19/13 0430  WBC 9.0 7.8 8.1  HGB 9.7* 9.6* 9.9*  HCT 31.4* 30.9* 31.9*  PLT 151 135* 125*   Coag's  Recent Labs Lab 03/13/13 1110 03/17/13 0300 03/18/13 0500 03/19/13 0430  APTT 24  --   --   --   INR 1.06 1.90* 1.80* 1.87*   BMET  Recent Labs Lab 03/17/13 0300 03/18/13 0500 03/19/13 0430  NA 132* 135 135  K 4.7 3.8 3.9  CL 99 101 99  CO2 22 27 27   BUN 31* 17 21  CREATININE 1.07 0.66 0.64  GLUCOSE 219* 112* 166*   Electrolytes  Recent Labs Lab 03/16/13 0240  03/17/13 0300 03/18/13 0500 03/19/13 0430  CALCIUM 7.9*  < > 7.8* 8.2* 8.4  MG 1.8  --   --  2.1 2.0  PHOS 4.1  --   --  2.1* 2.2*  < > = values in this interval not displayed. ABG  Recent Labs Lab 03/15/13 0137 03/15/13 0701 03/16/13 1457  PHART 7.458* 7.402 7.281*  PCO2ART 38.4 45.6* 50.2*  PO2ART 380.0* 100.0 251.0*   Liver Enzymes  Recent Labs Lab  03/13/13 1110 03/17/13 0915  AST 8 2541*  ALT 16 3524*  ALKPHOS 67 134*  BILITOT 0.3 1.0  ALBUMIN 3.2* 2.6*   Cardiac Enzymes  Recent Labs Lab 03/14/13 0820 03/16/13 0405  03/16/13 1457 03/16/13 2111 03/17/13 0311 03/19/13 0430  TROPONINI  --  0.58*  < > 0.44* 1.32* 0.91*  --   PROBNP 4169.0* 1650.0*  --   --   --   --  2143.0*  < > = values in this interval not displayed. Glucose  Recent Labs Lab 03/18/13 0741 03/18/13 1120 03/18/13 1146 03/18/13 1632 03/18/13 2038 03/19/13 0009  GLUCAP 57* 58* 112* 81 119* 149*    Imaging: Dg Chest Port 1 View  03/19/2013   CLINICAL DATA:  Intubation.  EXAM: PORTABLE CHEST - 1 VIEW  COMPARISON:  03/18/2013.  FINDINGS: Endotracheal tube in stable position. The NG tube has been withdrawn, its tip is now in the this distal esophagus. Repositioning should be considered. Left IJ line in stable position. Cardiac pacer in stable position. Previously identified interstitial pulmonary edema and pleural effusions have almost completely cleared. Cardiomegaly with normal pulmonary vascularity. No pneumothorax or acute osseous abnormality.  IMPRESSION: 1. Interim partial withdrawal of NG tube, its tip is in the esophagus. This report was phoned to the patient's ICU nurse at 7:42 a.m. on 03/19/2013. 2. Interim near complete clearing of pulmonary interstitial edema and pleural effusions.   Electronically Signed   By: Maisie Fus  Register   On: 03/19/2013 07:43   Dg Chest Port 1 View  03/18/2013   CLINICAL DATA:  Followup atelectasis.  EXAM: PORTABLE CHEST - 1 VIEW  COMPARISON:  03/17/2013  FINDINGS: Prior median sternotomy. Left pacer and endotracheal tube are unchanged. Cardiomegaly. Worsening vascular congestion and interstitial opacities, likely interstitial edema. Suspect small bilateral effusions.  IMPRESSION: New mild interstitial edema.  Small bilateral effusions.   Electronically Signed   By: Charlett Nose M.D.   On: 03/18/2013 07:32   US Abdomen  Limited Ruq  03/17/2013   CLINICAL DATA:  Possible acute cholecystitis.  EXAM: US ABDOMEN LIMITED - RIGHT UPPER QUADRANT  COMPARISON:  None.  FINDINGS: Gallbladder  The gallbladder appears adequately distended. The gallbladder wall measures 2.5 mm which is within the limits of normal. There is no pericholecystic fluid or positive sonographic Murphy's sign. No stones are evident.  Common bile duct  Diameter: 5.8 mm.  Liver:  No focal lesion identified. Within normal limits in parenchymal echogenicity. There is no intrahepatic ductal dilation demonstrated.  IMPRESSION: There is no evidence of acute cholecystitis nor of gallstones. The common bile duct and observed portions of the liver appear normal.   Electronically Signed   By: David  [place]   On: 03/17/2013 16:32    ASSESSMENT / PLAN:  PULMONARY A: Acute respiratory  failure >> ?secondary to generalized deconditioning and systolic heart failure in setting of acute stressor. Probable sleep disordered breathing.  03/19/13: Low RR on SBT - ? Due to low phos or OSA or physical deconditioning. Unable to extubate  P:   Pressure support wean and full vent support as tolerated PRN BD's Would transition with CPAP/BiPAP after extubation  CARDIOVASCULAR A:  CAD, s/p CABG (2006). Chronic CHF. AFRVR >> off cardizem 12/18. Severe PVD. Hypotension. Acute RLE arterial occlusion - s/p emergent revasc 12/16. Respiratory arrest leading to cardiac arrest 12/18.  03/19/13  stil in positive balance. Anassarca +. BNP some high. EF 25%. Off vasopressors  P:  Lasix scheduled start 03/18/13 Wean off pressors to keep SBP > 90, MAP > 65 Rate control per cardiology Continue plavix, zetia per cardiology VVS managing post op issues Heparin/coumadin per VVS and cardiology Hold outpt coreg, imdur, diovan, hydralazine for now  RENAL A:   Hypophos - mild. P: Replete phos   Monitor renal fx, urine outpt, electrolytes Keep foley in for  now  GASTROINTESTINAL A:   Hx of GERD. Nutrition. ?gallbadder edema noted on CT chest 12/18 >> Abd exam benign. On TF  02/2113 - tolerating tube feeds. eXam benign P:   protonix for SUP   HEMATOLOGIC A:   Anemia of chronic disease. P:  F/u CBC Transfuse for Hb < 7  INFECTIOUS A:   No issues P:   Monitor clinically  ENDOCRINE A:   DM type II. P:   SSI Hold outpt glucovance, januvia for now  NEUROLOGIC A:   Post op pain.  12/212/14 - tolerating reductinn is home dose gabapentin. Still on some fent gtt  P:   Dc Versed Use fentanyl prn while on vent Continue neurontin but lower dose  RHEUMATOLOGY A: Hx of Rheumatoid arthritis P: Continue prednisone Hold outpt abatacept, leflunomide for now   GLOBAL  03/19/13  - diurese and consider extubatino to bipap 03/20/13. Change sedation to prn fent  The patient is critically ill with multiple organ systems failure and requires high complexity decision making for assessment and support, frequent evaluation and titration of therapies, application of advanced monitoring technologies and extensive interpretation of multiple databases.   Critical Care Time devoted to patient care services described in this note is  35  Minutes.  Dr. Kalman Shan, M.D., [facility].C.P Pulmonary and Critical Care Medicine Staff Physician [facility] [place] [facility] Pager: [phone], If no answer or between  15:00h - 7:00h: call [phone]  03/19/2013 2:15 PM

## 2013-03-20 ENCOUNTER — Inpatient Hospital Stay (HOSPITAL_COMMUNITY): Payer: Medicare Other

## 2013-03-20 LAB — CBC WITH DIFFERENTIAL/PLATELET
Basophils Absolute: 0 10*3/uL (ref 0.0–0.1)
Eosinophils Relative: 1 % (ref 0–5)
HCT: 32 % — ABNORMAL LOW (ref 39.0–52.0)
Hemoglobin: 10 g/dL — ABNORMAL LOW (ref 13.0–17.0)
Lymphocytes Relative: 12 % (ref 12–46)
MCV: 85.8 fL (ref 78.0–100.0)
Monocytes Absolute: 1.1 10*3/uL — ABNORMAL HIGH (ref 0.1–1.0)
Monocytes Relative: 13 % — ABNORMAL HIGH (ref 3–12)
Neutro Abs: 6.3 10*3/uL (ref 1.7–7.7)
WBC: 8.4 10*3/uL (ref 4.0–10.5)

## 2013-03-20 LAB — HEPARIN LEVEL (UNFRACTIONATED): Heparin Unfractionated: 0.3 IU/mL (ref 0.30–0.70)

## 2013-03-20 LAB — GLUCOSE, CAPILLARY
Glucose-Capillary: 121 mg/dL — ABNORMAL HIGH (ref 70–99)
Glucose-Capillary: 138 mg/dL — ABNORMAL HIGH (ref 70–99)
Glucose-Capillary: 267 mg/dL — ABNORMAL HIGH (ref 70–99)
Glucose-Capillary: 180 mg/dL — ABNORMAL HIGH (ref 70–99)

## 2013-03-20 LAB — BASIC METABOLIC PANEL
BUN: 22 mg/dL (ref 6–23)
CO2: 32 mEq/L (ref 19–32)
Calcium: 8.6 mg/dL (ref 8.4–10.5)
Creatinine, Ser: 0.59 mg/dL (ref 0.50–1.35)
GFR calc non Af Amer: >90 mL/min (ref >90)
Glucose, Bld: 272 mg/dL — ABNORMAL HIGH (ref 70–99)
Sodium: 135 mEq/L (ref 135–145)

## 2013-03-20 LAB — PROTIME-INR: INR: 1.53 — ABNORMAL HIGH (ref 0.00–1.49)

## 2013-03-20 LAB — MAGNESIUM: Magnesium: 1.7 mg/dL (ref 1.5–2.5)

## 2013-03-20 MED ORDER — HEPARIN (PORCINE) IN NACL 100-0.45 UNIT/ML-% IJ SOLN
1900.0000 [IU]/h | INTRAMUSCULAR | Status: DC
  Administered 2013-03-20 – 2013-03-21 (×3): 1900 [IU]/h via INTRAVENOUS
  Filled 2013-03-20 (×5): qty 250

## 2013-03-20 MED ORDER — PANTOPRAZOLE SODIUM 40 MG PO TBEC
40.0000 mg | DELAYED_RELEASE_TABLET | Freq: Every day | ORAL | Status: DC
  Administered 2013-03-21 – 2013-03-25 (×5): 40 mg via ORAL
  Filled 2013-03-20 (×4): qty 1

## 2013-03-20 MED ORDER — POTASSIUM CHLORIDE 10 MEQ/50ML IV SOLN
INTRAVENOUS | Status: AC
  Filled 2013-03-20: qty 100

## 2013-03-20 MED ORDER — POTASSIUM CHLORIDE 10 MEQ/50ML IV SOLN
10.0000 meq | INTRAVENOUS | Status: AC
  Administered 2013-03-20 (×4): 10 meq via INTRAVENOUS
  Filled 2013-03-20 (×2): qty 50

## 2013-03-20 MED ORDER — WARFARIN SODIUM 7.5 MG PO TABS
7.5000 mg | ORAL_TABLET | Freq: Once | ORAL | Status: AC
  Administered 2013-03-20: 7.5 mg via ORAL
  Filled 2013-03-20: qty 1

## 2013-03-20 MED ORDER — PREDNISONE 5 MG PO TABS
5.0000 mg | ORAL_TABLET | Freq: Two times a day (BID) | ORAL | Status: DC
  Administered 2013-03-20 – 2013-03-21 (×2): 5 mg via ORAL
  Filled 2013-03-20 (×5): qty 1

## 2013-03-20 NOTE — Progress Notes (Signed)
PULMONARY  / CRITICAL CARE MEDICINE  Name: [name] [name] MRN: [id number] DOB: 1950-06-16    ADMISSION DATE:  03/13/2013 CONSULTATION DATE:  12/17    REFERRING MD :  Myra Gianotti PRIMARY SERVICE: VVS  CHIEF COMPLAINT:  Ventilator management  BRIEF PATIENT DESCRIPTION:  89 M with extensive cardiac and PVD history presented 12/15 for scheduled arteriogram of BLEs and was found to be in [facility]. Admitted by Cardiology for mgmt with plan for arteriogram to be performed later in week. Developed acute arterial occlusion of RLE and underwent emergent R common femoral, profundofemoral, and superficial femoral endarterectomy with patch angioplasty. Remained intubated post op and PCCM asked to admit   STUDIES: Echo 12/18 >> EF 20 to 25%, mild MR, mild LA dilation CT chest 12/18 >> no PE, tiny effusions with b/l ATX, 7 mm nodule Rt minor fissure, soft tissue stranding around falciform ligament with gallbladder edema  LINES / TUBES: ETT 12/16 >> 12/17 R radial A-line 12/16 >> ETT 12/18 >>12/22 Lt IJ CVL 12/18 >>   CULTURES: MRSA PCR 12/15 >> NEG  ANTIBIOTICS: Cefuroxime (peri-op) 12/16 >> 12/17     SIGNIFICANT EVENTS:  12/15 adm with AFRVR  12/16 Acute RLE ischemia. To OR-- Emergent right common femoral, profundofemoral, and superficial femoral endarterectomy with patch angioplasty (Brabham) 12/18 respiratory arrest, reintubated. EF 25% oN ECHO 12/19 : Events of yesterday afternoon noted.  He reports feeling tired/fatigued, and could not keep up with his breathing. 03/18/13: Sedated on fent gtt. 40% fio2.  On fent 100 gtt -> Normal wUA. On SBT had repeated low HR though he was writing notes Repated hypoglycemia    SUBJECTIVE/OVERNIGHT/INTERVAL HX Improved. VC 1600 +  VITAL SIGNS: Temp:  [98.1 F (36.7 C)-99.3 F (37.4 C)] 98.9 F (37.2 C) (12/22 0400) Pulse Rate:  [44-129] 117 (12/22 0700) Resp:  [13-30] 15 (12/22 0700) BP: (99-162)/(60-138) 162/73 mmHg (12/22 0700) SpO2:   [98 %-100 %] 100 % (12/22 0753) FiO2 (%):  [30 %-40 %] 30 % (12/22 0753) Weight:  [99.6 kg (219 lb 9.3 oz)] 99.6 kg (219 lb 9.3 oz) (12/22 0500) INTAKE / OUTPUT: Intake/Output     12/21 0701 - 12/22 0700 12/22 0701 - 12/23 0700   I.V. (mL/kg) 1571.5 (15.8)    NG/GT 1165    IV Piggyback 253.3    Total Intake(mL/kg) 2989.8 (30)    Urine (mL/kg/hr) 7050 (2.9)    Stool 1 (0)    Total Output 7051     Net -4061.2            PHYSICAL EXAMINATION: General: no distress Neuro: RASS 0, follows commands, normal strength HEENT: ETT in place Cardiovascular: irregular, no murmur Lungs: scattered rhonchi, no wheeze Abdomen:  Soft, non tender, decreased bowel sounds, no guarding, no rebound Musculoskeletal: 1+ edema, lower extremities warm Skin: no rashes  LABS:  CBC  Recent Labs Lab 03/18/13 0500 03/19/13 0430 03/20/13 0300  WBC 7.8 8.1 8.4  HGB 9.6* 9.9* 10.0*  HCT 30.9* 31.9* 32.0*  PLT 135* 125* 121*   Coag's  Recent Labs Lab 03/13/13 1110  03/18/13 0500 03/19/13 0430 03/20/13 0300  APTT 24  --   --   --   --   INR 1.06  < > 1.80* 1.87* 1.53*  < > = values in this interval not displayed. BMET  Recent Labs Lab 03/18/13 0500 03/19/13 0430 03/20/13 0300  NA 135 135 135  K 3.8 3.9 3.1*  CL 101 99 95*  CO2 27 27 32  BUN 17 21 22   CREATININE 0.66 0.64 0.59  GLUCOSE 112* 166* 272*   Electrolytes  Recent Labs Lab 03/18/13 0500 03/19/13 0430 03/20/13 0300  CALCIUM 8.2* 8.4 8.6  MG 2.1 2.0 1.7  PHOS 2.1* 2.2* 1.8*   ABG  Recent Labs Lab 03/16/13 1457 03/18/13 0451 03/19/13 0547  PHART 7.281* 7.396 7.481*  PCO2ART 50.2* 46.0* 38.2  PO2ART 251.0* 157.0* 132.0*   Liver Enzymes  Recent Labs Lab 03/13/13 1110 03/17/13 0915  AST 8 2541*  ALT 16 3524*  ALKPHOS 67 134*  BILITOT 0.3 1.0  ALBUMIN 3.2* 2.6*   Cardiac Enzymes  Recent Labs Lab 03/14/13 0820 03/16/13 0405  03/16/13 1457 03/16/13 2111 03/17/13 0311 03/19/13 0430  TROPONINI  --   0.58*  < > 0.44* 1.32* 0.91*  --   PROBNP 4169.0* 1650.0*  --   --   --   --  2143.0*  < > = values in this interval not displayed. Glucose  Recent Labs Lab 03/19/13 0744 03/19/13 1130 03/19/13 1708 03/19/13 2005 03/20/13 0012 03/20/13 0426  GLUCAP 150* 138* 252* 263* 317* 267*    Imaging: Dg Chest Port 1 View  03/20/2013   CLINICAL DATA:  Hypoxia  EXAM: PORTABLE CHEST - 1 VIEW  COMPARISON:  March 19, 2013  FINDINGS: Endotracheal tube tip is 3.5 cm above the carina. Nasogastric tube in side-port now extend into the stomach. There is a central catheter with the tip in the superior vena cava just beyond the junction with the left innominate vein. Pacemaker lead tip is attached to the right ventricle. No pneumothorax.  There is atelectasis in the left base. The lungs are otherwise clear. Heart is enlarged with normal pulmonary vascularity. No adenopathy.  IMPRESSION: The tube and catheter positions are as described without pneumothorax. Stable cardiomegaly. Mild left base atelectasis. Lungs are otherwise clear.   Electronically Signed   By: Bretta Bang M.D.   On: 03/20/2013 07:40   Dg Chest Port 1 View  03/19/2013   CLINICAL DATA:  Intubation.  EXAM: PORTABLE CHEST - 1 VIEW  COMPARISON:  03/18/2013.  FINDINGS: Endotracheal tube in stable position. The NG tube has been withdrawn, its tip is now in the this distal esophagus. Repositioning should be considered. Left IJ line in stable position. Cardiac pacer in stable position. Previously identified interstitial pulmonary edema and pleural effusions have almost completely cleared. Cardiomegaly with normal pulmonary vascularity. No pneumothorax or acute osseous abnormality.  IMPRESSION: 1. Interim partial withdrawal of NG tube, its tip is in the esophagus. This report was phoned to the patient's ICU nurse at 7:42 a.m. on 03/19/2013. 2. Interim near complete clearing of pulmonary interstitial edema and pleural effusions.   Electronically  Signed   By: Maisie Fus  Register   On: 03/19/2013 07:43   Dg Abd Portable 1v  03/19/2013   CLINICAL DATA:  Nasogastric tube placement  EXAM: PORTABLE ABDOMEN - 1 VIEW  COMPARISON:  Earlier film of the same day  FINDINGS: Nasogastric tube has been advanced into the decompressed stomach, looped with the tip in the gastric fundus. No dilated small or large bowel. Lower abdomen excluded. Sternotomy wires and transvenous pacing lead noted.  IMPRESSION: Nasogastric tube loops in the stomach, tip in the fundus.   Electronically Signed   By: Oley Balm M.D.   On: 03/19/2013 15:10    ASSESSMENT / PLAN:  PULMONARY A: Acute respiratory failure >> ?secondary to generalized deconditioning and systolic heart failure in setting of acute stressor. Probable sleep  disordered breathing.  P:   Extubate Prn cpap/bipap  CARDIOVASCULAR A:  CAD, s/p CABG (2006). Chronic CHF. AFRVR >> off cardizem 12/18. Severe PVD. Hypotension. Acute RLE arterial occlusion - s/p emergent revasc 12/16. Respiratory arrest leading to cardiac arrest 12/18.  P:  Lasix scheduled to continue Rate control per cardiology Continue plavix, zetia per cardiology VVS managing post op issues Heparin/coumadin per VVS and cardiology   RENAL A:   Hypophos - mild. repleted Hypokalemia  P: Replete K  GASTROINTESTINAL A:   Hx of GERD. Nutrition. ?gallbadder edema noted on CT chest 12/18 >> Abd exam benign.   P:   protonix for SUP Change to po and d/c tf   HEMATOLOGIC A:   Anemia of chronic disease. P:  F/u CBC Transfuse for Hb < 7  INFECTIOUS A:   No issues P:   Monitor clinically  ENDOCRINE A:   DM type II. P:   SSI Hold outpt glucovance, januvia for now  NEUROLOGIC A:   Post op pain.  P:    Use fentanyl prn  Continue neurontin but lower dose  RHEUMATOLOGY A: Hx of Rheumatoid arthritis P: Continue prednisone Hold outpt abatacept, leflunomide for now   GLOBAL  Push to extubate  12/22.  The patient is critically ill with multiple organ systems failure and requires high complexity decision making for assessment and support, frequent evaluation and titration of therapies, application of advanced monitoring technologies and extensive interpretation of multiple databases.   Critical Care Time devoted to patient care services described in this note is  35  Minutes.  Dorcas Carrow Beeper  [phone]  Cell  [phone]  If no response or cell goes to voicemail, call beeper [phone]  03/20/2013 8:02 AM

## 2013-03-20 NOTE — Progress Notes (Signed)
Wasted iv fent. From bag. Witnessed by second RN Fatima Sanger. Washed down sink and flushed with water

## 2013-03-20 NOTE — Progress Notes (Signed)
ANTICOAGULATION CONSULT NOTE   Pharmacy Consult for Heparin / Coumadin Indication: atrial fibrillation  Allergies  Allergen Reactions  . Lipitor [Atorvastatin]     Weak muscle  . Lodine [Etodolac] Nausea And Vomiting  . Methotrexate Derivatives Nausea And Vomiting  . Ranitidine Hcl Nausea Only  . Remicade [Infliximab] Other (See Comments)    Chills and shakes     Patient Measurements: Height: 5\' 9"  (175.3 cm) Weight: 219 lb 9.3 oz (99.6 kg) IBW/kg (Calculated) : 70.7 Heparin Dosing Weight: 82 kg  Vital Signs: Temp: 98.9 F (37.2 C) (12/22 0400) Temp src: Oral (12/22 0400) BP: 113/81 mmHg (12/22 0900) Pulse Rate: 95 (12/22 0900)  Labs:  Recent Labs  03/18/13 0500 03/19/13 0430 03/20/13 0300  HGB 9.6* 9.9* 10.0*  HCT 30.9* 31.9* 32.0*  PLT 135* 125* 121*  LABPROT 20.4* 21.0* 18.0*  INR 1.80* 1.87* 1.53*  HEPARINUNFRC 0.44 0.36 0.30  CREATININE 0.66 0.64 0.59    Estimated Creatinine Clearance: 114.3 ml/min (by C-G formula based on Cr of 0.59).  Assessment: 18 YOM with AFib, s/p femoral endarterectomy, continues on heparin and warfarin.  Heparin level therapeutic at 0.3 today.  INR = 1.53.  CBC stable.  Goal of Therapy:  Heparin level 0.3-0.7 units/ml Monitor platelets by anticoagulation protocol: Yes INR 2-3   Plan:  1. Increase heparin to 1900 units/hr 2. Check heparin level, INR, CBC with AM labs 3. Continue to monitor for signs and symptoms of bleeding 4. Warfarin 7.5mg  PO x1  Ulyses Southward, PharmD Pager: [phone] 03/20/2013 10:21 AM

## 2013-03-20 NOTE — Progress Notes (Signed)
[facility] ADULT ICU REPLACEMENT PROTOCOL FOR AM LAB REPLACEMENT ONLY  The patient does apply for the [facility] Adult ICU Electrolyte Replacment Protocol based on the criteria listed below:   1. Is GFR >/= 40 ml/min? yes  Patient's GFR today is >90 2. Is urine output >/= 0.5 ml/kg/hr for the last 6 hours? yes Patient's UOP is 3.5 ml/kg/hr 3. Is BUN < 60 mg/dL? yes  Patient's BUN today is 22 4. Abnormal electrolyte(s): K3.1 5. Ordered repletion with: 16meq/IV 6. If a panic level lab has been reported, has the CCM MD in charge been notified? yes.   Physician:  Eldred Manges 03/20/2013 5:53 AM

## 2013-03-20 NOTE — Procedures (Signed)
Extubation Procedure Note  Patient Details:   Name: [name] [name] DOB: 21-Dec-1950 MRN: [id number]   Airway Documentation:     Evaluation  O2 sats: stable throughout Complications: No apparent complications Patient did tolerate procedure well. Bilateral Breath Sounds: Clear;Diminished Suctioning: Airway Yes  Patient extubated per MD order, and placed on 3l [place].  Vc 1500 prior to extubation.  Patient had positive cuff leak prior to extubation.  Patient able to vocalize post extubation.  No complications noted.  Will continue to monitor.  Lysbeth Penner [facility] 03/20/2013, 8:09 AM

## 2013-03-20 NOTE — Progress Notes (Addendum)
[facility]  Subjective  - Extubated this morning.  Alert and oriented.  No new complaints of foot or leg pain.    Objective 137/67 132 98.9 F (37.2 C) (Oral) 16 100%  Intake/Output Summary (Last 24 hours) at 03/20/13 8295 Last data filed at 03/20/13 6213  Gross per 24 hour  Intake 2794.33 ml  Output   6951 ml  Net -4156.67 ml   Right DP weak doppler, PT biphasic doppler signal Skin warm to touch incision clean and dry  Assessment/Planning: s/p Procedure(s):  PATCH ANGIOPLASTY Right Common Femoral, Superficial Femoral and Profunda-Femoral (Right)  ABDOMINAL AORTAGRAM with right leg runoff. Catheter in aorta times one (N/A)  INSERTION OF ILIAC STENT Right Common and External (Right)  Stable from vascular standpoint.  Extubated this am Hypokalemia replace Benjie Karvonen, EMMA [facility] 03/20/2013 8:22 AM  Agree with above.  Now extubated.  Right foot warm. Wounds are healing on lateral malleolar wound.  Doing well from vascular standpoint.  Waverly Ferrari, MD, FACS Beeper [phone] 03/20/2013

## 2013-03-21 ENCOUNTER — Inpatient Hospital Stay (HOSPITAL_COMMUNITY): Payer: Medicare Other

## 2013-03-21 LAB — BASIC METABOLIC PANEL
BUN: 15 mg/dL (ref 6–23)
CO2: 31 mEq/L (ref 19–32)
Calcium: 8.7 mg/dL (ref 8.4–10.5)
Chloride: 96 mEq/L (ref 96–112)
Creatinine, Ser: 0.62 mg/dL (ref 0.50–1.35)
Glucose, Bld: 218 mg/dL — ABNORMAL HIGH (ref 70–99)

## 2013-03-21 LAB — PROTIME-INR: INR: 1.74 — ABNORMAL HIGH (ref 0.00–1.49)

## 2013-03-21 LAB — CBC
HCT: 37.6 % — ABNORMAL LOW (ref 39.0–52.0)
MCH: 26.3 pg (ref 26.0–34.0)
MCV: 84.5 fL (ref 78.0–100.0)
Platelets: 148 10*3/uL — ABNORMAL LOW (ref 150–400)
RDW: 17.2 % — ABNORMAL HIGH (ref 11.5–15.5)
WBC: 10.8 10*3/uL — ABNORMAL HIGH (ref 4.0–10.5)

## 2013-03-21 LAB — GLUCOSE, CAPILLARY
Glucose-Capillary: 202 mg/dL — ABNORMAL HIGH (ref 70–99)
Glucose-Capillary: 171 mg/dL — ABNORMAL HIGH (ref 70–99)
Glucose-Capillary: 229 mg/dL — ABNORMAL HIGH (ref 70–99)
Glucose-Capillary: 204 mg/dL — ABNORMAL HIGH (ref 70–99)
Glucose-Capillary: 203 mg/dL — ABNORMAL HIGH (ref 70–99)

## 2013-03-21 LAB — HEPATIC FUNCTION PANEL
ALT: 663 U/L — ABNORMAL HIGH (ref 0–53)
Bilirubin, Direct: 0.6 mg/dL — ABNORMAL HIGH (ref 0.0–0.3)
Indirect Bilirubin: 0.9 mg/dL (ref 0.3–0.9)
Total Protein: 6 g/dL (ref 6.0–8.3)

## 2013-03-21 MED ORDER — BISACODYL 10 MG RE SUPP: 10.0000 mg | Freq: Once | RECTAL | Status: DC

## 2013-03-21 MED ORDER — POTASSIUM CHLORIDE 10 MEQ/50ML IV SOLN
10.0000 meq | INTRAVENOUS | Status: AC
  Administered 2013-03-21 (×4): 10 meq via INTRAVENOUS
  Filled 2013-03-21: qty 50

## 2013-03-21 MED ORDER — FENTANYL CITRATE 0.05 MG/ML IJ SOLN
12.5000 ug | INTRAMUSCULAR | Status: DC | PRN
  Administered 2013-03-21 (×4): 75 ug via INTRAVENOUS
  Administered 2013-03-21: 50 ug via INTRAVENOUS
  Administered 2013-03-22: 75 ug via INTRAVENOUS
  Filled 2013-03-21 (×7): qty 2

## 2013-03-21 MED ORDER — FUROSEMIDE 10 MG/ML IJ SOLN
40.0000 mg | Freq: Two times a day (BID) | INTRAMUSCULAR | Status: DC
  Administered 2013-03-21 – 2013-03-22 (×2): 40 mg via INTRAVENOUS
  Filled 2013-03-21 (×2): qty 4

## 2013-03-21 MED ORDER — HYDROCORTISONE SOD SUCCINATE 100 MG IJ SOLR
50.0000 mg | Freq: Four times a day (QID) | INTRAMUSCULAR | Status: DC
  Administered 2013-03-21 – 2013-03-22 (×4): 50 mg via INTRAVENOUS
  Filled 2013-03-21 (×8): qty 1

## 2013-03-21 MED ORDER — POTASSIUM CHLORIDE CRYS ER 20 MEQ PO TBCR
40.0000 meq | EXTENDED_RELEASE_TABLET | Freq: Two times a day (BID) | ORAL | Status: DC
  Administered 2013-03-21 (×2): 40 meq via ORAL
  Filled 2013-03-21 (×4): qty 2

## 2013-03-21 MED ORDER — METOPROLOL TARTRATE 1 MG/ML IV SOLN
2.5000 mg | INTRAVENOUS | Status: DC | PRN
  Administered 2013-03-21: 5 mg via INTRAVENOUS

## 2013-03-21 MED ORDER — WARFARIN SODIUM 7.5 MG PO TABS
7.5000 mg | ORAL_TABLET | Freq: Once | ORAL | Status: AC
  Administered 2013-03-21: 7.5 mg via ORAL
  Filled 2013-03-21: qty 1

## 2013-03-21 MED ORDER — METOPROLOL TARTRATE 1 MG/ML IV SOLN
5.0000 mg | Freq: Four times a day (QID) | INTRAVENOUS | Status: AC
  Administered 2013-03-21 (×2): 5 mg via INTRAVENOUS
  Filled 2013-03-21 (×2): qty 5

## 2013-03-21 NOTE — Progress Notes (Signed)
[facility] ADULT ICU REPLACEMENT PROTOCOL FOR AM LAB REPLACEMENT ONLY  The patient does not apply for the [facility] Adult ICU Electrolyte Replacment Protocol based on the criteria listed below:   1. Is GFR >/= 40 ml/min? yes  Patient's GFR today is >90 2. Is urine output >/= 0.5 ml/kg/hr for the last 6 hours? yes Patient's UOP is 1 ml/kg/hr 3. Is BUN < 60 mg/dL? yes  Patient's BUN today is 15 4. Abnormal electrolyte(s):K3.1 5. Ordered repletion with: 78meqX4/IV 6. If a panic level lab has been reported, has the CCM MD in charge been notified? yes.   Physician:  Holland Commons MD  [name] [name] 03/21/2013 6:44 AM

## 2013-03-21 NOTE — Progress Notes (Signed)
PULMONARY  / CRITICAL CARE MEDICINE  Name: [name] [name] MRN: [id number] DOB: 09-Feb-1951    ADMISSION DATE:  03/13/2013 CONSULTATION DATE:  12/17    REFERRING MD :  Myra Gianotti PRIMARY SERVICE: VVS  CHIEF COMPLAINT:  Ventilator management  BRIEF PATIENT DESCRIPTION:  53 M with extensive cardiac and PVD history presented 12/15 for scheduled arteriogram of BLEs and was found to be in [facility]. Admitted by Cardiology for mgmt with plan for arteriogram to be performed later in week. Developed acute arterial occlusion of RLE and underwent emergent R common femoral, profundofemoral, and superficial femoral endarterectomy with patch angioplasty. Remained intubated post op and PCCM asked to admit   STUDIES: Echo 12/18 >> EF 20 to 25%, mild MR, mild LA dilation CT chest 12/18 >> no PE, tiny effusions with b/l ATX, 7 mm nodule Rt minor fissure, soft tissue stranding around falciform ligament with gallbladder edema  LINES / TUBES: ETT 12/16 >> 12/17 R radial A-line 12/16 >>out ETT 12/18 >>12/22 Lt IJ CVL 12/18 >>   CULTURES: MRSA PCR 12/15 >> NEG  ANTIBIOTICS: Cefuroxime (peri-op) 12/16 >> 12/17     SIGNIFICANT EVENTS:  12/15 adm with AFRVR  12/16 Acute RLE ischemia. To OR-- Emergent right common femoral, profundofemoral, and superficial femoral endarterectomy with patch angioplasty ([place]) 12/18 respiratory arrest, reintubated. EF 25% oN ECHO 12/19 : Events of yesterday afternoon noted.  He reports feeling tired/fatigued, and could not keep up with his breathing. 03/18/13: Sedated on fent gtt. 40% fio2.  On fent 100 gtt -> Normal wUA. On SBT had repeated low HR though he was writing notes Repated hypoglycemia    SUBJECTIVE/OVERNIGHT/INTERVAL HX extub 12/22, now c/o abd pain. Min bms.  Nausea, not eating  VITAL SIGNS: Temp:  [97.3 F (36.3 C)-98.3 F (36.8 C)] 97.3 F (36.3 C) (12/23 0753) Pulse Rate:  [58-140] 131 (12/23 0800) Resp:  [9-26] 11 (12/23 0800) BP:  (97-156)/(69-127) 97/74 mmHg (12/23 0800) SpO2:  [93 %-100 %] 100 % (12/23 0800) Weight:  [92.4 kg (203 lb 11.3 oz)] 92.4 kg (203 lb 11.3 oz) (12/23 0500) INTAKE / OUTPUT: Intake/Output     12/22 0701 - 12/23 0700 12/23 0701 - 12/24 0700   I.V. (mL/kg) 1531.7 (16.6) 88 (1)   NG/GT 65    IV Piggyback 100 100   Total Intake(mL/kg) 1696.7 (18.4) 188 (2)   Urine (mL/kg/hr) 7850 (3.5) 775 (3.6)   Stool     Total Output 7850 775   Net -6153.3 -587          PHYSICAL EXAMINATION: General: no distress Neuro: awake and alert HEENT: moist mucus membranes Cardiovascular: irregular, no murmur Lungs: scattered rhonchi, no wheeze Abdomen: distended, min BS, tender epigastric area, tympanitic Musculoskeletal: 1+ edema, lower extremities warm Skin: no rashes  LABS:  CBC  Recent Labs Lab 03/19/13 0430 03/20/13 0300 03/21/13 0455  WBC 8.1 8.4 10.8*  HGB 9.9* 10.0* 11.7*  HCT 31.9* 32.0* 37.6*  PLT 125* 121* 148*   Coag's  Recent Labs Lab 03/19/13 0430 03/20/13 0300 03/21/13 0455  INR 1.87* 1.53* 1.74*   BMET  Recent Labs Lab 03/19/13 0430 03/20/13 0300 03/21/13 0455  NA 135 135 139  K 3.9 3.1* 3.1*  CL 99 95* 96  CO2 27 32 31  BUN 21 22 15   CREATININE 0.64 0.59 0.62  GLUCOSE 166* 272* 218*   Electrolytes  Recent Labs Lab 03/18/13 0500 03/19/13 0430 03/20/13 0300 03/21/13 0455  CALCIUM 8.2* 8.4 8.6 8.7  MG 2.1 2.0  1.7  --   PHOS 2.1* 2.2* 1.8*  --    ABG  Recent Labs Lab 03/16/13 1457 03/18/13 0451 03/19/13 0547  PHART 7.281* 7.396 7.481*  PCO2ART 50.2* 46.0* 38.2  PO2ART 251.0* 157.0* 132.0*   Liver Enzymes  Recent Labs Lab 03/17/13 0915  AST 2541*  ALT 3524*  ALKPHOS 134*  BILITOT 1.0  ALBUMIN 2.6*   Cardiac Enzymes  Recent Labs Lab 03/16/13 0405  03/16/13 1457 03/16/13 2111 03/17/13 0311 03/19/13 0430  TROPONINI 0.58*  < > 0.44* 1.32* 0.91*  --   PROBNP 1650.0*  --   --   --   --  2143.0*  < > = values in this interval not  displayed. Glucose  Recent Labs Lab 03/20/13 1222 03/20/13 1710 03/20/13 1938 03/20/13 2340 03/21/13 0426 03/21/13 0755  GLUCAP 153* 156* 180* 222* 202* 171*    Imaging: Dg Chest Port 1 View  03/20/2013   CLINICAL DATA:  Hypoxia  EXAM: PORTABLE CHEST - 1 VIEW  COMPARISON:  March 19, 2013  FINDINGS: Endotracheal tube tip is 3.5 cm above the carina. Nasogastric tube in side-port now extend into the stomach. There is a central catheter with the tip in the superior vena cava just beyond the junction with the left innominate vein. Pacemaker lead tip is attached to the right ventricle. No pneumothorax.  There is atelectasis in the left base. The lungs are otherwise clear. Heart is enlarged with normal pulmonary vascularity. No adenopathy.  IMPRESSION: The tube and catheter positions are as described without pneumothorax. Stable cardiomegaly. Mild left base atelectasis. Lungs are otherwise clear.   Electronically Signed   By: Bretta Bang M.D.   On: 03/20/2013 07:40   Dg Abd Portable 1v  03/19/2013   CLINICAL DATA:  Nasogastric tube placement  EXAM: PORTABLE ABDOMEN - 1 VIEW  COMPARISON:  Earlier film of the same day  FINDINGS: Nasogastric tube has been advanced into the decompressed stomach, looped with the tip in the gastric fundus. No dilated small or large bowel. Lower abdomen excluded. Sternotomy wires and transvenous pacing lead noted.  IMPRESSION: Nasogastric tube loops in the stomach, tip in the fundus.   Electronically Signed   By: Oley Balm M.D.   On: 03/19/2013 15:10    ASSESSMENT / PLAN:  PULMONARY A: Acute respiratory failure >>resolved  P:   Titrate oxygen  CARDIOVASCULAR A:  CAD, s/p CABG (2006). Chronic CHF. AFRVR >> off cardizem 12/18. Severe PVD.Marland Kitchen Acute RLE arterial occlusion - s/p emergent revasc 12/16. Respiratory arrest leading to cardiac arrest 12/18. Resolved  P:  Lasix scheduled to continue at lower dose Rate control per cardiology Continue  plavix, zetia per cardiology VVS managing post op issues Heparin/coumadin per VVS and cardiology   RENAL A:   Hypophos - mild. repleted Hypokalemia  P: Replete K further   GASTROINTESTINAL A:   Hx of GERD. abd distension and pain postop ?gallbadder edema noted on CT chest 12/18 >> ongoing abd pain and abd distension   P:   protonix for SUP ABD film Abd u/s for GB Hepatic function panel   HEMATOLOGIC A:   Anemia of chronic disease. P:  F/u CBC Transfuse for Hb < 7  INFECTIOUS A:   No issues Off all ABX P:   Monitor clinically  ENDOCRINE A:   DM type II. P:   SSI Hold outpt glucovance, januvia for now  NEUROLOGIC A:   Post op pain.  P:    Use fentanyl prn  D/c  neurontin with abd pain  RHEUMATOLOGY A: Hx of Rheumatoid arthritis P: Start IV solucortef , may not be absorbing prednisone Hold outpt abatacept, leflunomide for now   GLOBAL  Abd pain postop.  ?GB issues.   Needs eval. Start with abd films/U/S, lfts, Give stim for BM, change pred to iv solucortef. Keep in ICU  The patient is critically ill with multiple organ systems failure and requires high complexity decision making for assessment and support, frequent evaluation and titration of therapies, application of advanced monitoring technologies and extensive interpretation of multiple databases.   Critical Care Time devoted to patient care services described in this note is  35  Minutes.  Dorcas Carrow Beeper  [phone]  Cell  [phone]  If no response or cell goes to voicemail, call beeper [phone]  03/21/2013 9:21 AM

## 2013-03-21 NOTE — Progress Notes (Signed)
Inpatient Diabetes Program Recommendations  AACE/ADA: New Consensus Statement on Inpatient Glycemic Control (2013)  Target Ranges:  Prepandial:   less than 140 mg/dL      Peak postprandial:   less than 180 mg/dL (1-2 hours)      Critically ill patients:  140 - 180 mg/dL   Reason for Assessment:  Sub-optimal glycemic control   Results for [name], [name] (MRN [id number]) as of 03/21/2013 13:17  Ref. Range 03/20/2013 08:26 03/20/2013 12:22 03/20/2013 17:10 03/20/2013 19:38 03/20/2013 23:40 03/21/2013 04:26 03/21/2013 07:55 03/21/2013 12:37  Glucose-Capillary Latest Range: 70-99 mg/dL 469 (H) 629 (H) 528 (H) 180 (H) 222 (H) 202 (H) 171 (H) 229 (H)   Note:  Please consider adding either Lantus or Levemir 10 or 15 units either daily or at HS.  Patient had a low CBG on 12/20, but was on both Lantus and Glyburide at that time.  Glyburide probably contributed more to the hypoglycemia than the Lantus.  Thank you.  [name] Freund S. Elsie Lincoln, RN, CNS, CDE Inpatient Diabetes Program, team pager [phone]

## 2013-03-21 NOTE — Progress Notes (Signed)
Ward Givens, NP cardiology notified of sustaining HR 137-140s. BP 125/91. Lopressor ordered completed. Reordered. 5mg  Lopressor administered. Will cont to monitor. Cal Gindlesperger L

## 2013-03-21 NOTE — Progress Notes (Addendum)
[facility]  Subjective  - His biggest complaint is abdominal pain.  2 loose stools yesterday, some distention and minimal bowel sounds.  CCM notified.  They increased his pain medication.  Objective 129/69 128 97.8 F (36.6 C) (Oral) 12 100%  Intake/Output Summary (Last 24 hours) at 03/21/13 0731 Last data filed at 03/21/13 0701  Gross per 24 hour  Intake 1665.73 ml  Output   8075 ml  Net -6409.27 ml    Right LE well perfused Incision clean and dry Productive cough with wheezing Some distention and minimal bowel sounds. Lateral malleolus healing  Assessment/Planning: s/p Procedure(s):  PATCH ANGIOPLASTY Right Common Femoral, Superficial Femoral and Profunda-Femoral (Right)  ABDOMINAL AORTAGRAM with right leg runoff. Catheter in aorta times one (N/A)  INSERTION OF ILIAC STENT Right Common and External (Right)   Will let CCM work up abdominal pain  Pending C diff results  Thomasena Edis EMMA [facility] 03/21/2013 7:31 AM  Agree with above. Good Doppler flow in the right foot. Right groin incision looks fine.  Waverly Ferrari, MD, FACS Beeper [phone] 03/21/2013

## 2013-03-21 NOTE — Progress Notes (Signed)
Upon assessment, pt complaining of nausea and 9/10 pain in mid upper abdomen, faint & hypoactive bowel sounds auscultated in all 4 quadrants. HR in 130s. Abdomen soft, distended. Elink MD Deterding called, new orders received for increased fentanyl. Will continue to monitor. [name] [name]

## 2013-03-21 NOTE — Progress Notes (Signed)
eLink Physician-Brief Progress Note Patient Name: [name] [name] DOB: 22-May-1950 MRN: [id number]  Date of Service  03/21/2013   HPI/Events of Note  C/O of 9/10 abd pain.  Had received fentanyl 25 mcg earlier with no relief.  HR of 132 and hypertensive with BP 149/92 (107).  Extubated today with sats of 100% and RR of 22 on [place] O2.  Stool for C Diff ordered earlier.   eICU Interventions  Plan: Increase fentanyl dose to 75 mcg q2hr.  May need to increase if no relief.  Continue to monitor resp status   Intervention Category Intermediate Interventions: Pain - evaluation and management  Liat Mayol 03/21/2013, 12:13 AM

## 2013-03-21 NOTE — Progress Notes (Signed)
MD Nahser, cardiology notified of sustained HR 130s-140. 5mg  Lopressor given total this am. MD at bedside to assess pt. No new orders at this time. Will cont to monitor pt. Takeria Marquina L

## 2013-03-21 NOTE — Progress Notes (Addendum)
ANTICOAGULATION CONSULT NOTE   Pharmacy Consult for Heparin / Coumadin Indication: atrial fibrillation  Allergies  Allergen Reactions  . Lipitor [Atorvastatin]     Weak muscle  . Lodine [Etodolac] Nausea And Vomiting  . Methotrexate Derivatives Nausea And Vomiting  . Ranitidine Hcl Nausea Only  . Remicade [Infliximab] Other (See Comments)    Chills and shakes     Patient Measurements: Height: 5\' 9"  (175.3 cm) Weight: 203 lb 11.3 oz (92.4 kg) IBW/kg (Calculated) : 70.7 Heparin Dosing Weight: 82 kg  Vital Signs: Temp: 97.3 F (36.3 C) (12/23 0753) Temp src: Oral (12/23 0753) BP: 97/74 mmHg (12/23 0800) Pulse Rate: 131 (12/23 0800)  Labs:  Recent Labs  03/19/13 0430 03/20/13 0300 03/21/13 0455  HGB 9.9* 10.0* 11.7*  HCT 31.9* 32.0* 37.6*  PLT 125* 121* 148*  LABPROT 21.0* 18.0* 19.8*  INR 1.87* 1.53* 1.74*  HEPARINUNFRC 0.36 0.30 0.43  CREATININE 0.64 0.59 0.62    Estimated Creatinine Clearance: 110.3 ml/min (by C-G formula based on Cr of 0.62).  Assessment: 79 YOM with AFib, s/p femoral endarterectomy, continues on heparin and warfarin.  Heparin level therapeutic at 0.43 today.  INR has trended up to 1.74.  CBC stable.  Goal of Therapy:  Heparin level 0.3-0.7 units/ml Monitor platelets by anticoagulation protocol: Yes INR 2-3   Plan:  1. Cont heparin at 1900 units/hr 2. Check heparin level, INR, CBC with AM labs 3. Continue to monitor for signs and symptoms of bleeding 4. Warfarin 7.5mg  PO x1  Ulyses Southward, PharmD Pager: [phone] 03/21/2013 8:54 AM

## 2013-03-22 ENCOUNTER — Inpatient Hospital Stay (HOSPITAL_COMMUNITY): Payer: Medicare Other

## 2013-03-22 LAB — BASIC METABOLIC PANEL
BUN: 19 mg/dL (ref 6–23)
CO2: 29 mEq/L (ref 19–32)
Calcium: 8.9 mg/dL (ref 8.4–10.5)
Chloride: 98 mEq/L (ref 96–112)
Creatinine, Ser: 0.68 mg/dL (ref 0.50–1.35)
GFR calc Af Amer: >90 mL/min (ref >90)
Glucose, Bld: 251 mg/dL — ABNORMAL HIGH (ref 70–99)
Potassium: 4 mEq/L (ref 3.5–5.1)
Sodium: 139 mEq/L (ref 135–145)

## 2013-03-22 LAB — GLUCOSE, CAPILLARY
Glucose-Capillary: 222 mg/dL — ABNORMAL HIGH (ref 70–99)
Glucose-Capillary: 199 mg/dL — ABNORMAL HIGH (ref 70–99)
Glucose-Capillary: 197 mg/dL — ABNORMAL HIGH (ref 70–99)
Glucose-Capillary: 178 mg/dL — ABNORMAL HIGH (ref 70–99)

## 2013-03-22 MED ORDER — CLOPIDOGREL BISULFATE 75 MG PO TABS
75.0000 mg | ORAL_TABLET | Freq: Every day | ORAL | Status: DC
  Filled 2013-03-22: qty 1

## 2013-03-22 MED ORDER — METOPROLOL TARTRATE 50 MG PO TABS
50.0000 mg | ORAL_TABLET | Freq: Two times a day (BID) | ORAL | Status: DC
  Filled 2013-03-22 (×2): qty 1

## 2013-03-22 MED ORDER — METOPROLOL TARTRATE 1 MG/ML IV SOLN: 5.0000 mg | INTRAVENOUS | Status: DC | PRN

## 2013-03-22 MED ORDER — DIGOXIN 250 MCG PO TABS
0.2500 mg | ORAL_TABLET | Freq: Four times a day (QID) | ORAL | Status: AC
  Administered 2013-03-22 (×2): 0.25 mg via ORAL
  Filled 2013-03-22 (×2): qty 1

## 2013-03-22 MED ORDER — FUROSEMIDE 40 MG PO TABS
40.0000 mg | ORAL_TABLET | Freq: Two times a day (BID) | ORAL | Status: DC
  Administered 2013-03-22 – 2013-03-25 (×7): 40 mg via ORAL
  Filled 2013-03-22 (×10): qty 1

## 2013-03-22 MED ORDER — EZETIMIBE 10 MG PO TABS
10.0000 mg | ORAL_TABLET | Freq: Every day | ORAL | Status: DC
  Administered 2013-03-22 – 2013-03-25 (×4): 10 mg via ORAL
  Filled 2013-03-22 (×4): qty 1

## 2013-03-22 MED ORDER — HYDROCODONE-ACETAMINOPHEN 10-325 MG PO TABS
1.0000 | ORAL_TABLET | ORAL | Status: DC | PRN
  Administered 2013-03-22 – 2013-03-25 (×9): 1 via ORAL
  Filled 2013-03-22 (×9): qty 1

## 2013-03-22 MED ORDER — DIGOXIN 250 MCG PO TABS
0.2500 mg | ORAL_TABLET | Freq: Every day | ORAL | Status: DC
  Administered 2013-03-23 – 2013-03-25 (×3): 0.25 mg via ORAL
  Filled 2013-03-22 (×3): qty 1

## 2013-03-22 MED ORDER — CARVEDILOL 25 MG PO TABS
25.0000 mg | ORAL_TABLET | Freq: Two times a day (BID) | ORAL | Status: DC
  Administered 2013-03-22 – 2013-03-23 (×4): 25 mg via ORAL
  Filled 2013-03-22 (×8): qty 1

## 2013-03-22 MED ORDER — DIGOXIN 0.25 MG/ML IJ SOLN: 0.2500 mg | Freq: Four times a day (QID) | INTRAMUSCULAR | Status: DC

## 2013-03-22 MED ORDER — GABAPENTIN 300 MG PO CAPS
300.0000 mg | ORAL_CAPSULE | Freq: Two times a day (BID) | ORAL | Status: DC
  Administered 2013-03-22 – 2013-03-25 (×6): 300 mg via ORAL
  Filled 2013-03-22 (×7): qty 1

## 2013-03-22 MED ORDER — POTASSIUM CHLORIDE CRYS ER 20 MEQ PO TBCR
40.0000 meq | EXTENDED_RELEASE_TABLET | Freq: Every day | ORAL | Status: DC
  Administered 2013-03-22 – 2013-03-25 (×4): 40 meq via ORAL
  Filled 2013-03-22 (×4): qty 2

## 2013-03-22 MED ORDER — CLOPIDOGREL BISULFATE 75 MG PO TABS
75.0000 mg | ORAL_TABLET | Freq: Every day | ORAL | Status: DC
  Administered 2013-03-23 – 2013-03-25 (×3): 75 mg via ORAL
  Filled 2013-03-22 (×2): qty 1

## 2013-03-22 MED ORDER — PREDNISONE 10 MG PO TABS
10.0000 mg | ORAL_TABLET | Freq: Every day | ORAL | Status: DC
  Administered 2013-03-23 – 2013-03-25 (×3): 10 mg via ORAL
  Filled 2013-03-22 (×5): qty 1

## 2013-03-22 NOTE — Progress Notes (Signed)
ANTICOAGULATION CONSULT NOTE   Pharmacy Consult for Heparin / Coumadin Indication: atrial fibrillation  Allergies  Allergen Reactions  . Lipitor [Atorvastatin]     Weak muscle  . Lodine [Etodolac] Nausea And Vomiting  . Methotrexate Derivatives Nausea And Vomiting  . Ranitidine Hcl Nausea Only  . Remicade [Infliximab] Other (See Comments)    Chills and shakes     Patient Measurements: Height: 5\' 9"  (175.3 cm) Weight: 203 lb 0.7 oz (92.1 kg) IBW/kg (Calculated) : 70.7 Heparin Dosing Weight: 82 kg  Vital Signs: Temp: 97.3 F (36.3 C) (12/24 0748) Temp src: Oral (12/24 0748) BP: 137/95 mmHg (12/24 1000) Pulse Rate: 135 (12/24 1000)  Labs:  Recent Labs  03/20/13 0300 03/21/13 0455 03/22/13 0425  HGB 10.0* 11.7*  --   HCT 32.0* 37.6*  --   PLT 121* 148*  --   LABPROT 18.0* 19.8* 36.2*  INR 1.53* 1.74* 3.83*  HEPARINUNFRC 0.30 0.43 0.68  CREATININE 0.59 0.62 0.68    Estimated Creatinine Clearance: 110.1 ml/min (by C-G formula based on Cr of 0.68).  Assessment: 84 YOM with AFib, s/p femoral endarterectomy. On heparin and coumadin for anticoagulation. Heparin level therapeutic.  INR jumps all the way to 3.83. Since it jumped that high, it will remain up there. Will stop heparin today.   Goal of Therapy:  Heparin level 0.3-0.7 units/ml Monitor platelets by anticoagulation protocol: Yes INR 2-3   Plan:   Dc heparin No coumadin today Cont daily INR

## 2013-03-22 NOTE — Progress Notes (Signed)
PROGRESS NOTE  Subjective:   Pt is a 62 with hx of CAD, CABG ( Followed by Dr. Nedra Hai in [place], [place]) , ICD, DM, HTN PAD chronic systolic CHF, admitted by Dr. [place] for atrial fib with RVR after he [facility] to [facility] for peripheral vascular procedure by Dr. Edilia Bo.    He had respiratory failure He has had rapid atrial fib.     Objective:    Vital Signs:   Temp:  [97.3 F (36.3 C)-98.3 F (36.8 C)] 97.3 F (36.3 C) (12/24 0748) Pulse Rate:  [119-139] 134 (12/24 0900) Resp:  [9-23] 21 (12/24 0900) BP: (107-140)/(55-93) 121/81 mmHg (12/24 0900) SpO2:  [93 %-100 %] 100 % (12/24 0900) Weight:  [203 lb 0.7 oz (92.1 kg)] 203 lb 0.7 oz (92.1 kg) (12/24 0500)  Last BM Date: 03/21/13   24-hour weight change: Weight change: -10.6 oz (-0.3 kg)  Weight trends: Filed Weights   03/20/13 0500 03/21/13 0500 03/22/13 0500  Weight: 219 lb 9.3 oz (99.6 kg) 203 lb 11.3 oz (92.4 kg) 203 lb 0.7 oz (92.1 kg)    Intake/Output:  12/23 0701 - 12/24 0700 In: 1806 [I.V.:1606; IV Piggyback:200] Out: 2301 [Urine:2300; Stool:1] Total I/O In: 69 [I.V.:69] Out: 351 [Urine:350; Stool:1]   Physical Exam: BP 121/81  Pulse 134  Temp(Src) 97.3 F (36.3 C) (Oral)  Resp 21  Ht 5\' 9"  (1.753 m)  Wt 203 lb 0.7 oz (92.1 kg)  BMI 29.97 kg/m2  SpO2 100%  General: Vital signs reviewed and noted.  Head: Normocephalic, atraumatic.  Eyes: conjunctivae/corneas clear.  EOM's intact.   Throat: normal  Neck:  normal  Lungs:    mostly clear  Heart:  Irregl. irreg.  Abdomen:  Soft, non-tender, non-distended    Extremities: No edema    Neurologic: A&O X3, CN II - XII are grossly intact.   Psych: Normal     Labs: BMET:  Recent Labs  03/20/13 0300 03/21/13 0455 03/22/13 0425  NA 135 139 139  K 3.1* 3.1* 4.0  CL 95* 96 98  CO2 32 31 29  GLUCOSE 272* 218* 251*  BUN 22 15 19   CREATININE 0.59 0.62 0.68  CALCIUM 8.6 8.7 8.9  MG 1.7  --   --   PHOS 1.8*  --   --     Liver function  tests:  Recent Labs  03/21/13 1045  AST 47*  ALT 663*  ALKPHOS 183*  BILITOT 1.5*  PROT 6.0  ALBUMIN 2.8*   No results found for this basename: LIPASE, AMYLASE,  in the last 72 hours  CBC:  Recent Labs  03/20/13 0300 03/21/13 0455  WBC 8.4 10.8*  NEUTROABS 6.3  --   HGB 10.0* 11.7*  HCT 32.0* 37.6*  MCV 85.8 84.5  PLT 121* 148*    Cardiac Enzymes: No results found for this basename: CKTOTAL, CKMB, TROPONINI,  in the last 72 hours  Coagulation Studies:  Recent Labs  03/20/13 0300 03/21/13 0455 03/22/13 0425  LABPROT 18.0* 19.8* 36.2*  INR 1.53* 1.74* 3.83*    Other:   Other results:  Tele:  Atrial fib.    Medications:    Infusions: . heparin 1,900 Units/hr (03/22/13 0600)    Scheduled Medications: . antiseptic oral rinse  15 mL Mouth Rinse QID  . aspirin  81 mg Oral Daily  . bisacodyl  10 mg Rectal Once  . chlorhexidine  15 mL Mouth Rinse BID  . clobetasol cream  1 application Topical BID  . [  START ON 03/23/2013] clopidogrel  75 mg Oral Q breakfast  . collagenase   Topical Daily  . ezetimibe  10 mg Oral Daily  . ferrous gluconate  324 mg Oral Q breakfast  . furosemide  40 mg Oral BID  . insulin aspart  0-20 Units Subcutaneous Q4H  . metoprolol tartrate  50 mg Oral BID  . pantoprazole  40 mg Oral Q1200  . potassium chloride  40 mEq Oral Daily  . [START ON 03/23/2013] predniSONE  10 mg Oral Q breakfast  . sodium chloride  3 mL Intravenous Q12H  . Warfarin - Pharmacist Dosing Inpatient   Does not apply q1800    Assessment/ Plan:   Principal Problem:   Shock Active Problems:   Embolism and thrombosis of iliac artery   Peripheral vascular disease, unspecified   Acute on chronic systolic CHF (congestive heart failure)   Diabetes mellitus type 2 with peripheral artery disease   CAD (coronary artery disease)   S/P CABG x 5   Bradycardia   Hypotension, unspecified   Acute respiratory failure   Atrial flutter  1. Chronic systolic CHF:   EF 20-25% by echo.  Will continue to get HR under control.     Now on coreg 25 bid.   Will add digoxin for rate control.  Renal function is normal.  2. Atrial fib:  Rate is still high - will add digoxin and follow.      Disposition:  Length of Stay: 9  Vesta Mixer, Montez Hageman., MD, [facility] 03/22/2013, 10:01 AM Office [phone] Pager [phone]

## 2013-03-22 NOTE — Progress Notes (Addendum)
PULMONARY  / CRITICAL CARE MEDICINE  Name: [name] [name] MRN: [id number] DOB: 1950/09/24    ADMISSION DATE:  03/13/2013 CONSULTATION DATE:  12/17    REFERRING MD :  Myra Gianotti PRIMARY SERVICE: [facility]  CHIEF COMPLAINT:  Ventilator management  BRIEF PATIENT DESCRIPTION:  57 M with extensive cardiac and PVD history presented 12/15 for scheduled arteriogram of BLEs and was found to be in [facility]. Admitted by Cardiology for mgmt with plan for arteriogram to be performed later in week. Developed acute arterial occlusion of RLE and underwent emergent R common femoral, profundofemoral, and superficial femoral endarterectomy with patch angioplasty. Remained intubated post op and PCCM asked to admit   STUDIES: Echo 12/18 >> EF 20 to 25%, mild MR, mild LA dilation CT chest 12/18 >> no PE, tiny effusions with b/l ATX, 7 mm nodule Rt minor fissure, soft tissue stranding around falciform ligament with gallbladder edema  LINES / TUBES: ETT 12/16 >> 12/17 R radial A-line 12/16 >>out ETT 12/18 >>12/22 Lt IJ CVL 12/18 >>   CULTURES: MRSA PCR 12/15 >> NEG  ANTIBIOTICS: Cefuroxime (peri-op) 12/16 >> 12/17     SIGNIFICANT EVENTS:  12/15 adm with AFRVR  12/16 Acute RLE ischemia. To OR-- Emergent right common femoral, profundofemoral, and superficial femoral endarterectomy with patch angioplasty ([place]) 12/18 respiratory arrest, reintubated. EF 25% oN ECHO 12/19 : Events of yesterday afternoon noted.  He reports feeling tired/fatigued, and could not keep up with his breathing. 03/18/13: Sedated on fent gtt. 40% fio2.  On fent 100 gtt -> Normal wUA. On SBT had repeated low HR though he was writing notes Repated hypoglycemia    SUBJECTIVE/OVERNIGHT/INTERVAL HX Improved with BM  VITAL SIGNS: Temp:  [97.3 F (36.3 C)-98.3 F (36.8 C)] 97.3 F (36.3 C) (12/24 0748) Pulse Rate:  [119-139] 134 (12/24 0900) Resp:  [9-23] 21 (12/24 0900) BP: (107-140)/(55-93) 121/81 mmHg (12/24 0900) SpO2:   [93 %-100 %] 100 % (12/24 0900) Weight:  [92.1 kg (203 lb 0.7 oz)] 92.1 kg (203 lb 0.7 oz) (12/24 0500) INTAKE / OUTPUT: Intake/Output     12/23 0701 - 12/24 0700 12/24 0701 - 12/25 0700   I.V. (mL/kg) 1606 (17.4) 69 (0.7)   NG/GT     IV Piggyback 200    Total Intake(mL/kg) 1806 (19.6) 69 (0.7)   Urine (mL/kg/hr) 2300 (1) 350 (1.5)   Stool 1 (0) 1 (0)   Total Output 2301 351   Net -495 -282        Stool Occurrence 1 x      PHYSICAL EXAMINATION: General: no distress Neuro: awake and alert HEENT: moist mucus membranes Cardiovascular: irregular, no murmur Lungs:clear  Abdomen:BSA,nontender Musculoskeletal: 1+ edema, lower extremities warm Skin: no rashes  LABS:  CBC  Recent Labs Lab 03/19/13 0430 03/20/13 0300 03/21/13 0455  WBC 8.1 8.4 10.8*  HGB 9.9* 10.0* 11.7*  HCT 31.9* 32.0* 37.6*  PLT 125* 121* 148*   Coag's  Recent Labs Lab 03/20/13 0300 03/21/13 0455 03/22/13 0425  INR 1.53* 1.74* 3.83*   BMET  Recent Labs Lab 03/20/13 0300 03/21/13 0455 03/22/13 0425  NA 135 139 139  K 3.1* 3.1* 4.0  CL 95* 96 98  CO2 32 31 29  BUN 22 15 19   CREATININE 0.59 0.62 0.68  GLUCOSE 272* 218* 251*   Electrolytes  Recent Labs Lab 03/18/13 0500 03/19/13 0430 03/20/13 0300 03/21/13 0455 03/22/13 0425  CALCIUM 8.2* 8.4 8.6 8.7 8.9  MG 2.1 2.0 1.7  --   --  PHOS 2.1* 2.2* 1.8*  --   --    ABG  Recent Labs Lab 03/16/13 1457 03/18/13 0451 03/19/13 0547  PHART 7.281* 7.396 7.481*  PCO2ART 50.2* 46.0* 38.2  PO2ART 251.0* 157.0* 132.0*   Liver Enzymes  Recent Labs Lab 03/17/13 0915 03/21/13 1045  AST 2541* 47*  ALT 3524* 663*  ALKPHOS 134* 183*  BILITOT 1.0 1.5*  ALBUMIN 2.6* 2.8*   Cardiac Enzymes  Recent Labs Lab 03/16/13 0405  03/16/13 1457 03/16/13 2111 03/17/13 0311 03/19/13 0430  TROPONINI 0.58*  < > 0.44* 1.32* 0.91*  --   PROBNP 1650.0*  --   --   --   --  2143.0*  < > = values in this interval not  displayed. Glucose  Recent Labs Lab 03/21/13 0755 03/21/13 1237 03/21/13 1548 03/21/13 1931 03/21/13 2317 03/22/13 0745  GLUCAP 171* 229* 204* 202* 203* 222*    Imaging: US Abdomen Complete  03/21/2013   CLINICAL DATA:  Abdominal pain  EXAM: ULTRASOUND ABDOMEN COMPLETE  COMPARISON:  None.  FINDINGS: Gallbladder:  No gallstones or wall thickening visualized. No sonographic Murphy sign noted. Please note that left lateral decubitus images could not be performed.  Common bile duct:  Diameter: 4 mm in caliber  Liver:  No focal lesion identified. Within normal limits in parenchymal echogenicity.  IVC:  No abnormality visualized.  Pancreas:  The tail was obscured.  No obvious mass.  Spleen:  Size and appearance within normal limits.  Right Kidney:  Length: 12.1 cm in length. Echogenicity within normal limits. No mass or hydronephrosis visualized.  Left Kidney:  Length: 12.0 cm in length. Echogenicity within normal limits. No mass or hydronephrosis visualized.  Abdominal aorta:  Portions of the aorta were obscured. Maximal visualized caliber is 2.4 cm.  Other findings:  None.  IMPRESSION: Limited visualization of the gallbladder, pancreas and aorta. No acute intra-abdominal pathology.   Electronically Signed   By: Maryclare Bean M.D.   On: 03/21/2013 14:22   Dg Chest Port 1 View  03/22/2013   CLINICAL DATA:  Pulmonary edema  EXAM: PORTABLE CHEST - 1 VIEW  COMPARISON:  03/20/2013  FINDINGS: The cardiac silhouette is enlarged. A left internal jugular central venous catheter is appreciated with tip projecting in the region of superior vena cava. Patient is status post median sternotomy and coronary artery bypass grafting. A left chest wall ICD unit is appreciated with tip projecting in the region of the right ventricle. There is no evidence of focal regions of consolidation or focal infiltrates. The osseous structures are unremarkable. The patient's endotracheal tube and NG tube have been removed in the  interim.  IMPRESSION: Cardiomegaly without evidence of acute cardiopulmonary disease.   Electronically Signed   By: Salome Holmes M.D.   On: 03/22/2013 07:40   Dg Abd Portable 2v  03/21/2013   CLINICAL DATA:  Lower abdominal pain and nausea  EXAM: PORTABLE ABDOMEN - 2 VIEW  COMPARISON:  Portable abdominal film dated 19 March 2013.  FINDINGS: The bowel gas pattern is nonspecific. There is no evidence of ileus nor obstruction. The bony structures appear normal. There are bilateral common iliac artery stent grafts in place.  IMPRESSION: There is no evidence of bowel obstruction or ileus. No definite evidence of other acute intra-abdominal abnormality is demonstrated.   Electronically Signed   By: David  [place]   On: 03/21/2013 11:34    ASSESSMENT / PLAN:  PULMONARY A: Acute respiratory failure >>resolved  P:   Titrate oxygen  CARDIOVASCULAR A:  CAD, s/p CABG (2006). Chronic CHF. AFRVR >> off cardizem 12/18. Severe PVD.Marland Kitchen Acute RLE arterial occlusion - s/p emergent revasc 12/16. Respiratory arrest leading to cardiac arrest 12/18. Resolved  P:  Lasix scheduled to continue at lower dose and po Rate control per cardiology>>add coreg back po Continue plavix, zetia per cardiology VVS managing post op issues Heparin/coumadin per VVS and cardiology>>INR elevated. On heparin drip. Needs overlap then d/c hep drip   RENAL A:   Hypophos - mild. repleted Hypokalemia - repleted P: monitor   GASTROINTESTINAL A:   Hx of GERD. abd distension and pain postop resolved 12/24. prob ileus.  Neg abd w/u.  P:   protonix for SUP Adv diet   HEMATOLOGIC A:   Anemia of chronic disease. P:  F/u CBC Transfuse for Hb < 7  INFECTIOUS A:   No issues Off all ABX P:   Monitor clinically  ENDOCRINE A:   DM type II. P:   SSI Hold outpt glucovance, januvia for now  NEUROLOGIC A:   Post op pain.  P:    Use fentanyl prn  D/c neurontin with abd pain PT eval  RHEUMATOLOGY A: Hx  of Rheumatoid arthritis P: Resume po pred Hold other RA meds   GLOBAL  Improved medical status. Only issue is Afib with mild RVR but ok for tele bed I will ask [facility] to assume primary medical care AM 12/25 and PCCM is OFF  NOTE the pt verbalizes he does not want intubation under any circumstances going forward.    The patient is critically ill with multiple organ systems failure and requires high complexity decision making for assessment and support, frequent evaluation and titration of therapies, application of advanced monitoring technologies and extensive interpretation of multiple databases.   Critical Care Time devoted to patient care services described in this note is  35  Minutes.  Dorcas Carrow Beeper  [phone]  Cell  [phone]  If no response or cell goes to voicemail, call beeper [phone]  03/22/2013 9:33 AM

## 2013-03-22 NOTE — Progress Notes (Signed)
Pt t/x to 3W09 via wheelchair on monitor without event. NT [name] present for pt's arrival and verified tele hookup before my departure. Pt's wife, nancy, called to make aware of room change.   [name] [name]

## 2013-03-22 NOTE — Evaluation (Signed)
Physical Therapy Evaluation Patient Details Name: [name] [name] MRN: [id number] DOB: 26-Feb-1951 Today's Date: 03/22/2013 Time: 7829-5621 PT Time Calculation (min): 25 min  PT Assessment / Plan / Recommendation History of Present Illness  Pt with extensive cardiac history and underwent R fem pop and then went into shock while in hospital.  Clinical Impression  Pt tolerating mobility much better however cont' to demo decreased endurance/activity tolerance. Pt safe to d/c home with assist of family once medically stable.    PT Assessment  Patient needs continued PT services    Follow Up Recommendations  Home health PT;Supervision/Assistance - 24 hour    Does the patient have the potential to tolerate intense rehabilitation      Barriers to Discharge        Equipment Recommendations  None recommended by PT    Recommendations for Other Services     Frequency Min 3X/week    Precautions / Restrictions Precautions Precautions: Fall Restrictions Weight Bearing Restrictions: No   Pertinent Vitals/Pain Pt reports mild R groin pain      Mobility  Bed Mobility Bed Mobility: Supine to Sit Supine to Sit: 5: Supervision;HOB elevated Details for Bed Mobility Assistance: safe technique Transfers Transfers: Sit to Stand;Stand to Sit Sit to Stand: 4: Min guard;With upper extremity assist;From bed Stand to Sit: 4: Min guard;With upper extremity assist;To chair/3-in-1 Details for Transfer Assistance: safe technique Ambulation/Gait Ambulation/Gait Assistance: 4: Min guard Ambulation Distance (Feet): 120 Feet Assistive device: Rolling walker Ambulation/Gait Assistance Details: Pt with good walker management. pt became fatigue requiring seat rest break with report "my legs are going to give out." Gait Pattern: Step-through pattern;Decreased stride length Gait velocity: slow Stairs:  (discussed stair negotiation and demo'd how to negotiate them to pt and family however pt to  fatigued to trial at this time)   Exercises     PT Diagnosis: Generalized weakness  PT Problem List: Decreased activity tolerance;Decreased balance;Decreased mobility PT Treatment Interventions: Gait training;DME instruction;Functional mobility training;Therapeutic activities;Therapeutic exercise;Patient/family education;Balance training     PT Goals(Current goals can be found in the care plan section) Acute Rehab PT Goals Patient Stated Goal: to go home PT Goal Formulation: With patient Time For Goal Achievement: 04/05/13 Potential to Achieve Goals: Good  Visit Information  Last PT Received On: 03/22/13 Assistance Needed: +1 History of Present Illness: Pt with extensive cardiac history and underwent R fem pop and then went into shock while in hospital.       Prior Functioning  Home Living Family/patient expects to be discharged to:: Private residence Living Arrangements: Spouse/significant other Available Help at Discharge: Family;Available 24 hours/day Type of Home: House Home Access: Stairs to enter [employer] of Steps: 2 Entrance Stairs-Rails: Can reach both Home Layout: One level Home Equipment: Walker - 2 wheels;Cane - single point;Shower seat - built in;Wheelchair - manual Prior Function Level of Independence: Independent Communication Communication: No difficulties Dominant Hand: Right    Cognition  Cognition Arousal/Alertness: Awake/alert Behavior During Therapy: WFL for tasks assessed/performed Overall Cognitive Status: Within Functional Limits for tasks assessed    Extremity/Trunk Assessment Upper Extremity Assessment Upper Extremity Assessment: Overall WFL for tasks assessed Lower Extremity Assessment Lower Extremity Assessment: Generalized weakness Cervical / Trunk Assessment Cervical / Trunk Assessment: Normal   Balance Balance Balance Assessed: Yes Dynamic Standing Balance Dynamic Standing - Balance Support: During functional activity  (brushing teeth) Dynamic Standing - Level of Assistance: 5: Stand by assistance Dynamic Standing - Balance Activities:  (brushing teeth) Dynamic Standing - Comments: no  episodes of LOB  End of Session PT - End of Session Activity Tolerance: Patient limited by fatigue Patient left: in chair;with call bell/phone within reach;with family/visitor present Nurse Communication: Mobility status  GP     Marcene Brawn 03/22/2013, 3:16 PM  Lewis Shock, PT, DPT Pager #: [phone] Office #: [phone]

## 2013-03-22 NOTE — Progress Notes (Signed)
   VASCULAR PROGRESS NOTE  SUBJECTIVE: No specific complaints.  PHYSICAL EXAM: Filed Vitals:   03/22/13 0400 03/22/13 0439 03/22/13 0500 03/22/13 0600  BP: 140/87  107/86 131/91  Pulse: 125  131 119  Temp:  98.3 F (36.8 C)    TempSrc:  Oral    Resp: 14  11 14   Height:      Weight:   203 lb 0.7 oz (92.1 kg)   SpO2: 97%  96% 99%   Right foot warm. Wounds stable. Right groin incision looks fine.  LABS: Lab Results  Component Value Date   WBC 10.8* 03/21/2013   HGB 11.7* 03/21/2013   HCT 37.6* 03/21/2013   MCV 84.5 03/21/2013   PLT 148* 03/21/2013   Lab Results  Component Value Date   CREATININE 0.68 03/22/2013   Lab Results  Component Value Date   INR 3.83* 03/22/2013   CBG (last 3)   Recent Labs  03/21/13 1548 03/21/13 1931 03/21/13 2317  GLUCAP 204* 202* 203*    Principal Problem:   Shock Active Problems:   Embolism and thrombosis of iliac artery   Peripheral vascular disease, unspecified   Acute on chronic systolic CHF (congestive heart failure)   Diabetes mellitus type 2 with peripheral artery disease   CAD (coronary artery disease)   S/P CABG x 5   Bradycardia   Hypotension, unspecified   Acute respiratory failure   Atrial flutter  ASSESSMENT AND PLAN:  * 8 Days Post-Op s/p: right femoral endarterectomy and angioplasty and stenting of the right common and external iliac arteries.   * Doing well from Vascular standpoint.  Cari Caraway Beeper: [phone] 03/22/2013

## 2013-03-23 LAB — GLUCOSE, CAPILLARY
Glucose-Capillary: 105 mg/dL — ABNORMAL HIGH (ref 70–99)
Glucose-Capillary: 233 mg/dL — ABNORMAL HIGH (ref 70–99)
Glucose-Capillary: 277 mg/dL — ABNORMAL HIGH (ref 70–99)
Glucose-Capillary: 253 mg/dL — ABNORMAL HIGH (ref 70–99)

## 2013-03-23 LAB — CBC
HCT: 32.7 % — ABNORMAL LOW (ref 39.0–52.0)
Hemoglobin: 10 g/dL — ABNORMAL LOW (ref 13.0–17.0)
MCH: 26.2 pg (ref 26.0–34.0)
MCHC: 30.6 g/dL (ref 30.0–36.0)
MCV: 85.6 fL (ref 78.0–100.0)
Platelets: 152 10*3/uL (ref 150–400)
RBC: 3.82 MIL/uL — ABNORMAL LOW (ref 4.22–5.81)

## 2013-03-23 LAB — BASIC METABOLIC PANEL
BUN: 20 mg/dL (ref 6–23)
CO2: 31 mEq/L (ref 19–32)
GFR calc non Af Amer: >90 mL/min (ref >90)
Glucose, Bld: 117 mg/dL — ABNORMAL HIGH (ref 70–99)
Potassium: 3.1 mEq/L — ABNORMAL LOW (ref 3.5–5.1)
Sodium: 140 mEq/L (ref 135–145)

## 2013-03-23 MED ORDER — POTASSIUM CHLORIDE CRYS ER 20 MEQ PO TBCR
40.0000 meq | EXTENDED_RELEASE_TABLET | Freq: Once | ORAL | Status: AC
  Administered 2013-03-23: 40 meq via ORAL
  Filled 2013-03-23: qty 2

## 2013-03-23 MED ORDER — SODIUM CHLORIDE 0.9 % IJ SOLN
10.0000 mL | INTRAMUSCULAR | Status: DC | PRN
  Administered 2013-03-23 – 2013-03-25 (×7): 10 mL

## 2013-03-23 MED ORDER — SODIUM CHLORIDE 0.9 % IJ SOLN
10.0000 mL | Freq: Two times a day (BID) | INTRAMUSCULAR | Status: DC
  Administered 2013-03-24: 10 mL

## 2013-03-23 NOTE — Progress Notes (Addendum)
[facility] Progress Note  03/23/2013 7:31 AM 9 Days Post-Op  Subjective:  No complaints  Afebrile HR 60's-90'ss irregular 100's-120's systolic 100% RA  Filed Vitals:   03/23/13 0554  BP: 112/71  Pulse: 60  Temp: 97.6 F (36.4 C)  Resp: 18    Physical Exam: Incisions:  Right groin incision is c/d/i Extremities:  Right foot is warm and well perfused  CBC    Component Value Date/Time   WBC 6.4 03/23/2013 0500   RBC 3.82* 03/23/2013 0500   HGB 10.0* 03/23/2013 0500   HCT 32.7* 03/23/2013 0500   PLT 152 03/23/2013 0500   MCV 85.6 03/23/2013 0500   MCH 26.2 03/23/2013 0500   MCHC 30.6 03/23/2013 0500   RDW 17.5* 03/23/2013 0500   LYMPHSABS 1.0 03/20/2013 0300   MONOABS 1.1* 03/20/2013 0300   EOSABS 0.1 03/20/2013 0300   BASOSABS 0.0 03/20/2013 0300    BMET    Component Value Date/Time   NA 140 03/23/2013 0500   K 3.1* 03/23/2013 0500   CL 99 03/23/2013 0500   CO2 31 03/23/2013 0500   GLUCOSE 117* 03/23/2013 0500   BUN 20 03/23/2013 0500   CREATININE 0.77 03/23/2013 0500   CALCIUM 8.7 03/23/2013 0500   GFRNONAA >90 03/23/2013 0500   GFRAA >90 03/23/2013 0500    INR    Component Value Date/Time   INR 3.76* 03/23/2013 0500     Intake/Output Summary (Last 24 hours) at 03/23/13 0731 Last data filed at 03/22/13 1800  Gross per 24 hour  Intake    827 ml  Output    566 ml  Net    261 ml     Assessment:  62 y.o. male is s/p:  #1: Right common femoral, profundofemoral, and superficial femoral endarterectomy with patch angioplasty  #2: Abdominal aortogram  #3: Right lower extremity runoff  #4: Stent, right common iliac artery  #5: Stent, right external iliac artery  9 Days Post-Op  Plan: -pt doing well-ambulated in hall some yesterday -pt on coumadin managed by pharmacy-jump from 1.74 2 days ago to 3.76 today-continue close monitoring -Afib being managed by cardiology-appreciate their input. -explained the importance of  wound/groin care to prevent infection.  Pt expressed understanding.   Doreatha Massed, PA-C [facility] [phone] 03/23/2013 7:31 AM  Addendum  I have independently interviewed and examined the patient, and I agree with the physician assistant's findings.  R groin inc c/d/i.  Leonides Sake, MD [facility] [place] Office: [phone] Pager: [phone]  03/23/2013, 8:23 AM

## 2013-03-23 NOTE — Progress Notes (Signed)
[facility] PROGRESS NOTE  [name] [name] ZOX:[id number] DOB: 1950/12/09 DOA: 03/13/2013 PCP: Gaspar Skeeters, MD  Assessment/Plan: Acute RLE arterial occlusion - s/p emergent revasc 12/16.   Acute respiratory failure >>resolved Respiratory arrest leading to cardiac arrest 12/18. Resolved  AF. RVR >> off cardizem 12/18. Continue with Coreg, digoxin. Coumadin. Cardio following.   CAD: on aspirin and plavix. Patient also on coumadin for A fib. Will defer to cardio and vascular continuation of multiple anticoagulant.   Hypokalemia; K-CL supplement  ordered.   Abd distension and pain postop resolved 12/24. prob ileus; Resolved.  Anemia of chronic disease. Hb stable at 10.  Diabetes; SSI.  Diarrhea; improved. C diff negative on 12-23.  Transaminases; repeat in am.   Disposition per vascular and cardio.   Procedures: Echo 12/18 >> EF 20 to 25%, mild MR, mild LA dilation  CT chest 12/18 >> no PE, tiny effusions with b/l ATX, 7 mm nodule Rt minor fissure, soft tissue stranding around falciform ligament with gallbladder edema ETT 12/16 >> 12/17  R radial A-line 12/16 >>out  ETT 12/18 >>12/22   Antibiotics: Cefuroxime (peri-op) 12/16 >> 12/17    HPI/Subjective: Breathing well. Denies chest pain.  Still with watery stool, but less frequent. No eating a lot.   Objective: Filed Vitals:   03/23/13 1400  BP: 117/77  Pulse: 76  Temp: 98.8 F (37.1 C)  Resp: 18    Intake/Output Summary (Last 24 hours) at 03/23/13 1449 Last data filed at 03/23/13 0900  Gross per 24 hour  Intake    720 ml  Output      0 ml  Net    720 ml   Filed Weights   03/21/13 0500 03/22/13 0500 03/23/13 0554  Weight: 92.4 kg (203 lb 11.3 oz) 92.1 kg (203 lb 0.7 oz) 92.67 kg (204 lb 4.8 oz)    Exam:   General:  No acute distress.   Cardiovascular: S 1, S 2 IRR  Respiratory: Crackles bases.   Abdomen: Bs present, soft, NT  Musculoskeletal: trace edema.   Data Reviewed: Basic  Metabolic Panel:  Recent Labs Lab 03/18/13 0500 03/19/13 0430 03/20/13 0300 03/21/13 0455 03/22/13 0425 03/23/13 0500  NA 135 135 135 139 139 140  K 3.8 3.9 3.1* 3.1* 4.0 3.1*  CL 101 99 95* 96 98 99  CO2 27 27 32 31 29 31   GLUCOSE 112* 166* 272* 218* 251* 117*  BUN 17 21 22 15 19 20   CREATININE 0.66 0.64 0.59 0.62 0.68 0.77  CALCIUM 8.2* 8.4 8.6 8.7 8.9 8.7  MG 2.1 2.0 1.7  --   --   --   PHOS 2.1* 2.2* 1.8*  --   --   --    Liver Function Tests:  Recent Labs Lab 03/17/13 0915 03/21/13 1045  AST 2541* 47*  ALT 3524* 663*  ALKPHOS 134* 183*  BILITOT 1.0 1.5*  PROT 5.5* 6.0  ALBUMIN 2.6* 2.8*    Recent Labs Lab 03/17/13 0915  LIPASE 18  AMYLASE 13   No results found for this basename: AMMONIA,  in the last 168 hours CBC:  Recent Labs Lab 03/18/13 0500 03/19/13 0430 03/20/13 0300 03/21/13 0455 03/23/13 0500  WBC 7.8 8.1 8.4 10.8* 6.4  NEUTROABS  --  6.0 6.3  --   --   HGB 9.6* 9.9* 10.0* 11.7* 10.0*  HCT 30.9* 31.9* 32.0* 37.6* 32.7*  MCV 84.7 86.0 85.8 84.5 85.6  PLT 135* 125* 121* 148* 152   Cardiac Enzymes:  Recent Labs Lab 03/16/13 1457 03/16/13 2111 03/17/13 0311  TROPONINI 0.44* 1.32* 0.91*   BNP (last 3 results)  Recent Labs  03/14/13 0820 03/16/13 0405 03/19/13 0430  PROBNP 4169.0* 1650.0* 2143.0*   CBG:  Recent Labs Lab 03/22/13 2058 03/23/13 0010 03/23/13 0402 03/23/13 0735 03/23/13 1134  GLUCAP 178* 160* 124* 105* 233*    Recent Results (from the past 240 hour(s))  MRSA PCR SCREENING     Status: None   Collection Time    03/15/13  3:35 AM      Result Value Range Status   MRSA by PCR NEGATIVE  NEGATIVE Final   Comment:            The GeneXpert MRSA Assay (FDA     approved for NASAL specimens     only), is one component of a     comprehensive MRSA colonization     surveillance program. It is not     intended to diagnose MRSA     infection nor to guide or     monitor treatment for     MRSA infections.   CLOSTRIDIUM DIFFICILE BY PCR     Status: None   Collection Time    03/21/13  9:45 AM      Result Value Range Status   C difficile by pcr NEGATIVE  NEGATIVE Final     Studies: Dg Chest Port 1 View  03/22/2013   CLINICAL DATA:  Pulmonary edema  EXAM: PORTABLE CHEST - 1 VIEW  COMPARISON:  03/20/2013  FINDINGS: The cardiac silhouette is enlarged. A left internal jugular central venous catheter is appreciated with tip projecting in the region of superior vena cava. Patient is status post median sternotomy and coronary artery bypass grafting. A left chest wall ICD unit is appreciated with tip projecting in the region of the right ventricle. There is no evidence of focal regions of consolidation or focal infiltrates. The osseous structures are unremarkable. The patient's endotracheal tube and NG tube have been removed in the interim.  IMPRESSION: Cardiomegaly without evidence of acute cardiopulmonary disease.   Electronically Signed   By: Salome Holmes M.D.   On: 03/22/2013 07:40    Scheduled Meds: . antiseptic oral rinse  15 mL Mouth Rinse QID  . aspirin  81 mg Oral Daily  . bisacodyl  10 mg Rectal Once  . carvedilol  25 mg Oral BID WC  . chlorhexidine  15 mL Mouth Rinse BID  . clobetasol cream  1 application Topical BID  . clopidogrel  75 mg Oral Q breakfast  . collagenase   Topical Daily  . digoxin  0.25 mg Oral Daily  . ezetimibe  10 mg Oral Daily  . ferrous gluconate  324 mg Oral Q breakfast  . furosemide  40 mg Oral BID  . gabapentin  300 mg Oral BID  . insulin aspart  0-20 Units Subcutaneous Q4H  . pantoprazole  40 mg Oral Q1200  . potassium chloride  40 mEq Oral Daily  . potassium chloride  40 mEq Oral Once  . predniSONE  10 mg Oral Q breakfast  . sodium chloride  3 mL Intravenous Q12H  . Warfarin - Pharmacist Dosing Inpatient   Does not apply q1800   Continuous Infusions:   Principal Problem:   Shock Active Problems:   Embolism and thrombosis of iliac artery   Peripheral  vascular disease, unspecified   Acute on chronic systolic CHF (congestive heart failure)   Diabetes mellitus type 2 with  peripheral artery disease   CAD (coronary artery disease)   S/P CABG x 5   Bradycardia   Hypotension, unspecified   Acute respiratory failure   Atrial flutter    Time spent: 30 minutes.     Ladaisha Portillo  [facility] Pager [phone]. If 7PM-7AM, please contact night-coverage at [email or web address], password [facility] 03/23/2013, 2:49 PM  LOS: 10 days

## 2013-03-23 NOTE — Progress Notes (Signed)
ANTICOAGULATION CONSULT NOTE   Pharmacy Consult for Coumadin Indication: atrial fibrillation  Allergies  Allergen Reactions  . Lipitor [Atorvastatin]     Weak muscle  . Lodine [Etodolac] Nausea And Vomiting  . Methotrexate Derivatives Nausea And Vomiting  . Ranitidine Hcl Nausea Only  . Remicade [Infliximab] Other (See Comments)    Chills and shakes     Patient Measurements: Height: 5\' 9"  (175.3 cm) Weight: 204 lb 4.8 oz (92.67 kg) IBW/kg (Calculated) : 70.7 Heparin Dosing Weight: 82 kg  Vital Signs: Temp: 97.6 F (36.4 C) (12/25 0554) Temp src: Oral (12/25 0554) BP: 155/78 mmHg (12/25 0815) Pulse Rate: 87 (12/25 1012)  Labs:  Recent Labs  03/21/13 0455 03/22/13 0425 03/23/13 0500  HGB 11.7*  --  10.0*  HCT 37.6*  --  32.7*  PLT 148*  --  152  LABPROT 19.8* 36.2* 35.7*  INR 1.74* 3.83* 3.76*  HEPARINUNFRC 0.43 0.68  --   CREATININE 0.62 0.68 0.77    Estimated Creatinine Clearance: 110.4 ml/min (by C-G formula based on Cr of 0.77).  Assessment: 56 YOM with AFib, s/p femoral endarterectomy. On coumadin for anticoagulation.  INR remains supratherapeutic, but is decreasing.   Goal of Therapy:  Monitor platelets by anticoagulation protocol: Yes INR 2-3   Plan:  No coumadin again today Cont daily INR

## 2013-03-23 NOTE — Progress Notes (Addendum)
Patient ID: [name] [name], male   DOB: 1950-06-19, 62 y.o.   MRN: [id number]    SUBJECTIVE: Patient is stable today. He is not having any significant chest pain or shortness of breath. Digoxin was added to his meds yesterday. His atrial flutter rate is now under much better control.   Filed Vitals:   03/22/13 1109 03/22/13 1450 03/22/13 2137 03/23/13 0554  BP: 127/82  104/89 112/71  Pulse: 94 106 93 60  Temp: 97.5 F (36.4 C)  97.8 F (36.6 C) 97.6 F (36.4 C)  TempSrc: Oral  Oral Oral  Resp: 18  18 18   Height:      Weight:    204 lb 4.8 oz (92.67 kg)  SpO2: 100%  94% 98%     Intake/Output Summary (Last 24 hours) at 03/23/13 0814 Last data filed at 03/22/13 1800  Gross per 24 hour  Intake    758 ml  Output    215 ml  Net    543 ml    LABS: Basic Metabolic Panel:  Recent Labs  46/96/29 0425 03/23/13 0500  NA 139 140  K 4.0 3.1*  CL 98 99  CO2 29 31  GLUCOSE 251* 117*  BUN 19 20  CREATININE 0.68 0.77  CALCIUM 8.9 8.7   Liver Function Tests:  Recent Labs  03/21/13 1045  AST 47*  ALT 663*  ALKPHOS 183*  BILITOT 1.5*  PROT 6.0  ALBUMIN 2.8*   No results found for this basename: LIPASE, AMYLASE,  in the last 72 hours CBC:  Recent Labs  03/21/13 0455 03/23/13 0500  WBC 10.8* 6.4  HGB 11.7* 10.0*  HCT 37.6* 32.7*  MCV 84.5 85.6  PLT 148* 152   Cardiac Enzymes: No results found for this basename: CKTOTAL, CKMB, CKMBINDEX, TROPONINI,  in the last 72 hours BNP: No components found with this basename: POCBNP,  D-Dimer: No results found for this basename: DDIMER,  in the last 72 hours Hemoglobin A1C: No results found for this basename: HGBA1C,  in the last 72 hours Fasting Lipid Panel: No results found for this basename: CHOL, HDL, LDLCALC, TRIG, CHOLHDL, LDLDIRECT,  in the last 72 hours Thyroid Function Tests: No results found for this basename: TSH, T4TOTAL, FREET3, T3FREE, THYROIDAB,  in the last 72 hours  RADIOLOGY: Ct Angio Chest Pe  W/cm &/or Wo Cm  03/16/2013   CLINICAL DATA:  Decreased O2 saturation and bradycardia. Respiratory arrest.  EXAM: CT ANGIOGRAPHY CHEST WITH CONTRAST  TECHNIQUE: Multidetector CT imaging of the chest was performed using the standard protocol during bolus administration of intravenous contrast. Multiplanar CT image reconstructions including MIPs were obtained to evaluate the vascular anatomy.  CONTRAST:  OMNIPAQUE IOHEXOL 350 MG/ML SOLN  COMPARISON:  Chest x-ray dated 03/16/2013  FINDINGS: RA/RV ratio is normal. There is borderline cardiomegaly. There are no pulmonary emboli. There are tiny bilateral pleural effusions with posterior atelectasis in the right upper and lower lobes and minimal atelectasis at the left lung base. Endotracheal tube is in good position.  There is a in 12 mm lymph node in the mediastinal fat just to the left of the aortic arch, nonspecific. No other mediastinal adenopathy. No hilar adenopathy.  There is a small nodule along the right minor fissure consistent with an intrapulmonary lymph node measuring 7 x 4 x 3 mm.  No acute osseous abnormality.  Limited views of the upper abdomen demonstrate abnormal soft tissue stranding in the soft tissues it at the anterior aspect of the  falciform ligament and there is a edema of the gallbladder wall. The possibility of acute cholecystitis should be considered.  Review of the MIP images confirms the above findings.  IMPRESSION: 1. No pulmonary emboli. 2. Edema of the gallbladder wall with soft tissue stranding in the adjacent peritoneal fat suggesting the possibility of acute cholecystitis. 3. Tiny bilateral effusions with bilateral slight atelectasis.   Electronically Signed   By: Geanie Cooley M.D.   On: 03/16/2013 17:03   US Abdomen Complete  03/21/2013   CLINICAL DATA:  Abdominal pain  EXAM: ULTRASOUND ABDOMEN COMPLETE  COMPARISON:  None.  FINDINGS: Gallbladder:  No gallstones or wall thickening visualized. No sonographic Murphy sign noted.  Please note that left lateral decubitus images could not be performed.  Common bile duct:  Diameter: 4 mm in caliber  Liver:  No focal lesion identified. Within normal limits in parenchymal echogenicity.  IVC:  No abnormality visualized.  Pancreas:  The tail was obscured.  No obvious mass.  Spleen:  Size and appearance within normal limits.  Right Kidney:  Length: 12.1 cm in length. Echogenicity within normal limits. No mass or hydronephrosis visualized.  Left Kidney:  Length: 12.0 cm in length. Echogenicity within normal limits. No mass or hydronephrosis visualized.  Abdominal aorta:  Portions of the aorta were obscured. Maximal visualized caliber is 2.4 cm.  Other findings:  None.  IMPRESSION: Limited visualization of the gallbladder, pancreas and aorta. No acute intra-abdominal pathology.   Electronically Signed   By: Maryclare Bean M.D.   On: 03/21/2013 14:22   Dg Chest Port 1 View  03/22/2013   CLINICAL DATA:  Pulmonary edema  EXAM: PORTABLE CHEST - 1 VIEW  COMPARISON:  03/20/2013  FINDINGS: The cardiac silhouette is enlarged. A left internal jugular central venous catheter is appreciated with tip projecting in the region of superior vena cava. Patient is status post median sternotomy and coronary artery bypass grafting. A left chest wall ICD unit is appreciated with tip projecting in the region of the right ventricle. There is no evidence of focal regions of consolidation or focal infiltrates. The osseous structures are unremarkable. The patient's endotracheal tube and NG tube have been removed in the interim.  IMPRESSION: Cardiomegaly without evidence of acute cardiopulmonary disease.   Electronically Signed   By: Salome Holmes M.D.   On: 03/22/2013 07:40   Dg Chest Port 1 View  03/20/2013   CLINICAL DATA:  Hypoxia  EXAM: PORTABLE CHEST - 1 VIEW  COMPARISON:  March 19, 2013  FINDINGS: Endotracheal tube tip is 3.5 cm above the carina. Nasogastric tube in side-port now extend into the stomach. There is a  central catheter with the tip in the superior vena cava just beyond the junction with the left innominate vein. Pacemaker lead tip is attached to the right ventricle. No pneumothorax.  There is atelectasis in the left base. The lungs are otherwise clear. Heart is enlarged with normal pulmonary vascularity. No adenopathy.  IMPRESSION: The tube and catheter positions are as described without pneumothorax. Stable cardiomegaly. Mild left base atelectasis. Lungs are otherwise clear.   Electronically Signed   By: Bretta Bang M.D.   On: 03/20/2013 07:40   Dg Chest Port 1 View  03/19/2013   CLINICAL DATA:  Intubation.  EXAM: PORTABLE CHEST - 1 VIEW  COMPARISON:  03/18/2013.  FINDINGS: Endotracheal tube in stable position. The NG tube has been withdrawn, its tip is now in the this distal esophagus. Repositioning should be considered. Left IJ line  in stable position. Cardiac pacer in stable position. Previously identified interstitial pulmonary edema and pleural effusions have almost completely cleared. Cardiomegaly with normal pulmonary vascularity. No pneumothorax or acute osseous abnormality.  IMPRESSION: 1. Interim partial withdrawal of NG tube, its tip is in the esophagus. This report was phoned to the patient's ICU nurse at 7:42 a.m. on 03/19/2013. 2. Interim near complete clearing of pulmonary interstitial edema and pleural effusions.   Electronically Signed   By: Maisie Fus  Register   On: 03/19/2013 07:43   Dg Chest Port 1 View  03/18/2013   CLINICAL DATA:  Followup atelectasis.  EXAM: PORTABLE CHEST - 1 VIEW  COMPARISON:  03/17/2013  FINDINGS: Prior median sternotomy. Left pacer and endotracheal tube are unchanged. Cardiomegaly. Worsening vascular congestion and interstitial opacities, likely interstitial edema. Suspect small bilateral effusions.  IMPRESSION: New mild interstitial edema.  Small bilateral effusions.   Electronically Signed   By: Charlett Nose M.D.   On: 03/18/2013 07:32   Dg Chest Port 1  View  03/17/2013   CLINICAL DATA:  Endotracheal tube placement  EXAM: PORTABLE CHEST - 1 VIEW  COMPARISON:  03/16/2013  FINDINGS: Endotracheal tube is again noted 15 mm above the carinal. This could be withdrawn slightly. A nasogastric catheter is noted within the stomach. A pacing device is again seen. Left central venous line is again noted. The lungs are clear. The cardiac shadow remains enlarged.  IMPRESSION: Support apparatus as described.  No acute abnormality is noted.   Electronically Signed   By: Alcide Clever M.D.   On: 03/17/2013 08:05   Dg Chest Port 1 View  03/16/2013   CLINICAL DATA:  Orogastric tube placement  EXAM: PORTABLE CHEST - 1 VIEW  COMPARISON:  Study obtained earlier in the day.  FINDINGS: Orogastric tube tip and side port are in the stomach. Endotracheal tube tip is 1.6 cm above the carina. Central catheter tip is at the junction of the left innominate vein and superior vena cava. Pacemaker leads are attached to the right atrium and right ventricle. No pneumothorax. There is a small left pleural effusion. Lungs are otherwise clear. Heart is enlarged with normal pulmonary vascularity. No adenopathy. Patient is status post internal mammary bypass grafting.  IMPRESSION: Tube and catheter positions as described without pneumothorax. Note that the orogastric tube tip and side port are in the proximal stomach. Small left pleural effusion. Lungs otherwise clear. Cardiomegaly, stable.   Electronically Signed   By: Bretta Bang M.D.   On: 03/16/2013 19:21   Dg Chest Portable 1 View  03/16/2013   CLINICAL DATA:  Central line and endotracheal tube placement.  EXAM: PORTABLE CHEST - 1 VIEW  COMPARISON:  03/16/2013.  FINDINGS: Endotracheal tube noted with its tip approximately 2.4 cm above the carina. Left IJ central line noted in good anatomic position. Cardiac pacer noted with lead tip projecting over right ventricle. Cardiomegaly. No pulmonary venous congestion. Prior CABG. Mild  subsegmental atelectasis left lung base.  IMPRESSION: 1. Interim placement of endotracheal tube and left IJ line with good anatomic positioning.  2. Stable cardiomegaly.  Prior CABG.  Cardiac pacer.  3. Mild left base atelectasis versus infiltrate.   Electronically Signed   By: Maisie Fus  Register   On: 03/16/2013 15:33   Dg Chest Portable 1 View  03/16/2013   CLINICAL DATA:  Atelectasis.  EXAM: PORTABLE CHEST - 1 VIEW  COMPARISON:  03/15/2013.  FINDINGS: Cardiopericardial silhouette is enlarged. CABG markers are present along with median sternotomy wires. Endotracheal tube  and enteric tube have been removed. AICD lead appears unchanged. Left basilar atelectasis appears similar to the prior exam. There is no airspace disease. No pleural effusion is identified. Monitoring leads project over the chest.  IMPRESSION: Cardiomegaly without failure. Interval removal of support apparatus.   Electronically Signed   By: Andreas Newport M.D.   On: 03/16/2013 07:57   Dg Chest Port 1 View  03/15/2013   CLINICAL DATA:  Post right lower extremity vascular surgery  EXAM: PORTABLE CHEST - 1 VIEW  COMPARISON:  03/13/2013; 11/09/2006  FINDINGS: Grossly unchanged enlarged cardiac silhouette and mediastinal contours given a reduced lung volumes and patient rotation. Atherosclerotic plaque within the thoracic aorta. Post median sternotomy. Grossly stable position of left anterior chest wall single lead AICD/pacemaker. Interval intubation with endotracheal tube overlying the tracheal air column with tip approximately 2.5 cm above the carina. Enteric tube tip and side port projects below the left hemidiaphragm. Chronic pulmonary venous congestion without frank evidence of edema. Grossly unchanged left basilar opacities, likely atelectasis. No definite pleural effusion or pneumothorax. Unchanged bones.  IMPRESSION: 1. Appropriately positioned support apparatus as above. No pneumothorax. 2. Cardiomegaly and pulmonary venous congestion  without frank evidence of edema. 3. Unchanged left basilar atelectasis/scar.   Electronically Signed   By: Simonne Come M.D.   On: 03/15/2013 08:14   Dg Chest Port 1 View  03/13/2013   CLINICAL DATA:  CHF.  EXAM: PORTABLE CHEST - 1 VIEW  COMPARISON:  11/09/2006.  FINDINGS: Pacemaker enters from the left with leads in the region of the right ventricle. Battery pack limits evaluation of the left upper lung zone.  Post CABG.  Cardiomegaly.  Remote rib fractures.  No gross pneumothorax.  No segmental consolidation or pulmonary edema.  The patient would eventually benefit from two view chest.  IMPRESSION: Pacemaker enters from the left with leads in the region of the right ventricle. Battery pack limits evaluation of the left upper lung zone.  Post CABG.  Cardiomegaly.  No segmental consolidation or pulmonary edema.   Electronically Signed   By: Bridgett Larsson M.D.   On: 03/13/2013 16:42   Dg Abd Portable 1v  03/19/2013   CLINICAL DATA:  Nasogastric tube placement  EXAM: PORTABLE ABDOMEN - 1 VIEW  COMPARISON:  Earlier film of the same day  FINDINGS: Nasogastric tube has been advanced into the decompressed stomach, looped with the tip in the gastric fundus. No dilated small or large bowel. Lower abdomen excluded. Sternotomy wires and transvenous pacing lead noted.  IMPRESSION: Nasogastric tube loops in the stomach, tip in the fundus.   Electronically Signed   By: Oley Balm M.D.   On: 03/19/2013 15:10   Dg Abd Portable 2v  03/21/2013   CLINICAL DATA:  Lower abdominal pain and nausea  EXAM: PORTABLE ABDOMEN - 2 VIEW  COMPARISON:  Portable abdominal film dated 19 March 2013.  FINDINGS: The bowel gas pattern is nonspecific. There is no evidence of ileus nor obstruction. The bony structures appear normal. There are bilateral common iliac artery stent grafts in place.  IMPRESSION: There is no evidence of bowel obstruction or ileus. No definite evidence of other acute intra-abdominal abnormality is  demonstrated.   Electronically Signed   By: David  [place]   On: 03/21/2013 11:34   US Abdomen Limited Ruq  03/17/2013   CLINICAL DATA:  Possible acute cholecystitis.  EXAM: US ABDOMEN LIMITED - RIGHT UPPER QUADRANT  COMPARISON:  None.  FINDINGS: Gallbladder  The gallbladder appears adequately distended.  The gallbladder wall measures 2.5 mm which is within the limits of normal. There is no pericholecystic fluid or positive sonographic Murphy's sign. No stones are evident.  Common bile duct  Diameter: 5.8 mm.  Liver:  No focal lesion identified. Within normal limits in parenchymal echogenicity. There is no intrahepatic ductal dilation demonstrated.  IMPRESSION: There is no evidence of acute cholecystitis nor of gallstones. The common bile duct and observed portions of the liver appear normal.   Electronically Signed   By: David  [place]   On: 03/17/2013 16:32    PHYSICAL EXAM   patient is oriented to person time and place. Affect is normal. There is no jugulovenous distention. Lungs reveal a few scattered rhonchi. Cardiac exam reveals S1 and S2. The abdomen is soft. He does not have any significant peripheral edema.   TELEMETRY:  I have reviewed telemetry today March 23, 2013. There is atrial flutter. The rate is now nicely controlled.   ASSESSMENT AND PLAN:    Status post vascular surgery this admission for severe disease. No definite evidence of embolus at that time      Acute on chronic systolic CHF (congestive heart failure)  His volume status now appears to be under much better control. No change in therapy today.      Atrial flutter      Rate is now better controlled with his current medications.  Hypokalemia   Potassium this morning is 3.1. There is an order for the patient received 40 mEq of potassium daily. I will give him one extra dose today.   Willa Rough 03/23/2013 8:14 AM

## 2013-03-24 DIAGNOSIS — I255 Ischemic cardiomyopathy: Secondary | ICD-10-CM | POA: Diagnosis present

## 2013-03-24 DIAGNOSIS — Z7901 Long term (current) use of anticoagulants: Secondary | ICD-10-CM

## 2013-03-24 DIAGNOSIS — Z95810 Presence of automatic (implantable) cardiac defibrillator: Secondary | ICD-10-CM

## 2013-03-24 LAB — PROTIME-INR: Prothrombin Time: 26 seconds — ABNORMAL HIGH (ref 11.6–15.2)

## 2013-03-24 LAB — BASIC METABOLIC PANEL
BUN: 13 mg/dL (ref 6–23)
CO2: 30 mEq/L (ref 19–32)
Calcium: 8.6 mg/dL (ref 8.4–10.5)
Creatinine, Ser: 0.7 mg/dL (ref 0.50–1.35)
GFR calc Af Amer: >90 mL/min (ref >90)
GFR calc non Af Amer: >90 mL/min (ref >90)
Glucose, Bld: 108 mg/dL — ABNORMAL HIGH (ref 70–99)
Sodium: 140 mEq/L (ref 135–145)

## 2013-03-24 LAB — HEPATIC FUNCTION PANEL
AST: 17 U/L (ref 0–37)
Bilirubin, Direct: 0.2 mg/dL (ref 0.0–0.3)
Indirect Bilirubin: 0.5 mg/dL (ref 0.3–0.9)
Total Bilirubin: 0.7 mg/dL (ref 0.3–1.2)

## 2013-03-24 LAB — GLUCOSE, CAPILLARY
Glucose-Capillary: 102 mg/dL — ABNORMAL HIGH (ref 70–99)
Glucose-Capillary: 155 mg/dL — ABNORMAL HIGH (ref 70–99)
Glucose-Capillary: 245 mg/dL — ABNORMAL HIGH (ref 70–99)
Glucose-Capillary: 297 mg/dL — ABNORMAL HIGH (ref 70–99)

## 2013-03-24 MED ORDER — GLUCERNA SHAKE PO LIQD
237.0000 mL | Freq: Every day | ORAL | Status: DC
  Administered 2013-03-24: 237 mL via ORAL

## 2013-03-24 MED ORDER — CARVEDILOL 25 MG PO TABS
37.5000 mg | ORAL_TABLET | Freq: Two times a day (BID) | ORAL | Status: DC
  Filled 2013-03-24 (×2): qty 1

## 2013-03-24 MED ORDER — LOSARTAN POTASSIUM 50 MG PO TABS
50.0000 mg | ORAL_TABLET | Freq: Every day | ORAL | Status: DC
  Administered 2013-03-24 – 2013-03-25 (×2): 50 mg via ORAL
  Filled 2013-03-24 (×2): qty 1

## 2013-03-24 MED ORDER — CARVEDILOL 25 MG PO TABS
37.5000 mg | ORAL_TABLET | Freq: Two times a day (BID) | ORAL | Status: DC
  Administered 2013-03-24 – 2013-03-25 (×3): 37.5 mg via ORAL
  Filled 2013-03-24 (×5): qty 1

## 2013-03-24 MED ORDER — WARFARIN SODIUM 2.5 MG PO TABS
2.5000 mg | ORAL_TABLET | Freq: Once | ORAL | Status: AC
  Administered 2013-03-24: 2.5 mg via ORAL
  Filled 2013-03-24: qty 1

## 2013-03-24 NOTE — Progress Notes (Signed)
ANTICOAGULATION CONSULT NOTE   Pharmacy Consult for Coumadin Indication: atrial fibrillation  Allergies  Allergen Reactions  . Lipitor [Atorvastatin]     Weak muscle  . Lodine [Etodolac] Nausea And Vomiting  . Methotrexate Derivatives Nausea And Vomiting  . Ranitidine Hcl Nausea Only  . Remicade [Infliximab] Other (See Comments)    Chills and shakes     Patient Measurements: Height: 5\' 9"  (175.3 cm) Weight: 203 lb 3.2 oz (92.171 kg) IBW/kg (Calculated) : 70.7 Heparin Dosing Weight: 82 kg  Vital Signs: Temp: 97.9 F (36.6 C) (12/26 0411) Temp src: Oral (12/26 0411) BP: 125/68 mmHg (12/26 0950) Pulse Rate: 57 (12/26 0411)  Labs:  Recent Labs  03/22/13 0425 03/23/13 0500 03/24/13 0500  HGB  --  10.0*  --   HCT  --  32.7*  --   PLT  --  152  --   LABPROT 36.2* 35.7* 26.0*  INR 3.83* 3.76* 2.48*  HEPARINUNFRC 0.68  --   --   CREATININE 0.68 0.77 0.70    Estimated Creatinine Clearance: 110.1 ml/min (by C-G formula based on Cr of 0.7).  Assessment: 57 YOM with AFib, s/p femoral endarterectomy. On coumadin for anticoagulation.  INR now down within therapeutic range. Patient has been very difficult to manage and it appears that he will go home on warfarin. Will restart coumadin tonight with low dose. No bleeding issues have been noted, H/h fairly stable.  Goal of Therapy:  Monitor platelets by anticoagulation protocol: Yes INR 2-3   Plan:  Warfarin 2.5mg  tonight - watch INR closely Cont daily INR Warfarin education completed  Sheppard Coil PharmD., BCPS Clinical Pharmacist Pager [phone] 03/24/2013 11:19 AM

## 2013-03-24 NOTE — Progress Notes (Signed)
Physical Therapy Treatment Patient Details Name: [name] [name] MRN: [id number] DOB: [birth year] Today's Date: 03/24/2013 Time: 4098-1191 PT Time Calculation (min): 12 min  PT Assessment / Plan / Recommendation  History of Present Illness 62 y.o. male admitted to [facility] on 03/13/13 with a-fib with RVR found to have a non-healing wound and underwent R femoral endarterectomy, patch angioplasty, and multiple stents on 03/14/13.  Pt with post-op VDFR.  Extubated 12/17. Pt developed shock and acute respiratory failure on 12/18. Code Blue called. Pt re-intubated 12/18, re-extubated 12/22.  Pt with significant cardiac hx including: myocardial infarction in 2000. He presented with acute coronary syndrome in 2006 and subsequently underwent coronary bypass surgery x5 in [facility]. He has a history of congestive heart failure and is status post ICD in January 2014.     PT Comments   Pt did better walking today not needing to take a seated rest break. He still seems to rely on the stability of an assistive device to get around.  He would benefit from HHPT services, but is politely declining them at this time stating he did not think there was enough room in his home to do much therapy and he doesn't even think he can fit a RW in there with him.  His wife works, but only 4 hours a day and some of those hours are before he even gets up out of the bed.  PT will continue to work with him acutely and encourage him to participate in HHPT at discharge.  If MD could also encourage him to do so that would be helpful.    Follow Up Recommendations  Home health PT;Supervision/Assistance - 24 hour (he states he doesn't feel like he needs HH therapy)     Does the patient have the potential to tolerate intense rehabilitation    NA  Barriers to Discharge   None      Equipment Recommendations  None recommended by PT    Recommendations for Other Services   None  Frequency Min 3X/week   Progress towards PT Goals  Progress towards PT goals: Progressing toward goals  Plan Current plan remains appropriate    Precautions / Restrictions Precautions Precautions: Fall   Pertinent Vitals/Pain See vitals flow sheet. VSS throughout on RA.    Mobility  Bed Mobility Bed Mobility: Not assessed (pt seated in recliner chair. ) Transfers Sit to Stand: 5: Supervision;With upper extremity assist;With armrests;From chair/3-in-1 Stand to Sit: 5: Supervision;With upper extremity assist;With armrests;To chair/3-in-1 Details for Transfer Assistance: supervision for safety Ambulation/Gait Ambulation/Gait Assistance: 5: Supervision Ambulation Distance (Feet): 200 Feet Assistive device: Other (Comment) (WC in case he needed to sit down. ) Ambulation/Gait Assistance Details: Pt did not need any seated rest breaks today, but does still feel like he needs the support of the WC to walk.  Gait Pattern: Step-through pattern;Decreased stride length Gait velocity: decreased      PT Goals (current goals can now be found in the care plan section) Acute Rehab PT Goals Patient Stated Goal: to go home  Visit Information  Last PT Received On: 03/24/13 Assistance Needed: +1 History of Present Illness: 62 y.o. male admitted to [facility] on 03/13/13 with a-fib with RVR found to have a non-healing wound and underwent R femoral endarterectomy, patch angioplasty, and multiple stents on 03/14/13.  Pt with post-op VDFR.  Extubated 12/17. Pt developed shock and acute respiratory failure on 12/18. Code Blue called. Pt re-intubated 12/18, re-extubated 12/22.  Pt with significant cardiac hx including: myocardial  infarction in 2000. He presented with acute coronary syndrome in 2006 and subsequently underwent coronary bypass surgery x5 in [facility]. He has a history of congestive heart failure and is status post ICD in January 2014.      Subjective Data  Subjective: Pt reports that he needed a chair unexpectedly when walking last sessio.    Patient Stated Goal: to go home   Cognition  Cognition Arousal/Alertness: Awake/alert Behavior During Therapy: WFL for tasks assessed/performed Overall Cognitive Status: Within Functional Limits for tasks assessed       End of Session PT - End of Session Activity Tolerance: Patient limited by fatigue Patient left: in chair;with call bell/phone within reach     [place] B. Dunia Pringle, PT, DPT [phone]   03/24/2013, 4:01 PM

## 2013-03-24 NOTE — Progress Notes (Signed)
NUTRITION FOLLOW UP  Intervention:    Glucerna Shake daily (220 kcals, 10 gm protein per 8 fl oz bottle) RD to follow for nutrition care plan  New Nutrition Dx:   Increased nutrient needs related to post-op state as evidenced by estimated nutrition needs, ongoing  New Goal:   Pt to meet >/= 90% of their estimated nutrition needs, progressing  Monitor:   PO & supplemental intake, weight, labs, I/O's  Assessment:   Patient with extensive cardiac and PVD history presented for scheduled arteriogram of BLEs and was found to be in AF RVR; developed acute arterial occlusion of RLE and underwent emergent surgery.   Patient s/p procedures 12/16  RIGHT COMMON FEMORAL, PROFUNDOFEMORAL AND SUPERFICIAL FEMORAL ENDARTERECTOMY  PATCH ANGIOPLASTY   Extubated 12/17. Pt developed shock and acute respiratory failure on 12/18. Code Blue called. Pt re-intubated 12/18, re-extubated 12/22.  Patient's diet advanced to Heart Healthy 12/24.  PO intake variable at 50-85% per flowsheet records.  Diarrhea and abdominal distention improved.  Would benefit from addition of nutrition supplement given post-op state.  Height: Ht Readings from Last 1 Encounters:  03/13/13 5\' 9"  (1.753 m)    Weight Status:   Wt Readings from Last 1 Encounters:  03/24/13 203 lb 3.2 oz (92.171 kg)    Body mass index is 29.99 kg/(m^2).  Re-estimated needs:  Kcal: 2000-2200 Protein: 100-110 gm Fluid: 2.0-2.2 L  Skin: groin surgical incision   Diet Order: Cardiac   Intake/Output Summary (Last 24 hours) at 03/24/13 1121 Last data filed at 03/23/13 1800  Gross per 24 hour  Intake    600 ml  Output      0 ml  Net    600 ml    Labs:   Recent Labs Lab 03/18/13 0500 03/19/13 0430 03/20/13 0300  03/22/13 0425 03/23/13 0500 03/24/13 0500  NA 135 135 135  < > 139 140 140  K 3.8 3.9 3.1*  < > 4.0 3.1* 3.4*  CL 101 99 95*  < > 98 99 101  CO2 27 27 32  < > 29 31 30   BUN 17 21 22   < > 19 20 13   CREATININE 0.66  0.64 0.59  < > 0.68 0.77 0.70  CALCIUM 8.2* 8.4 8.6  < > 8.9 8.7 8.6  MG 2.1 2.0 1.7  --   --   --   --   PHOS 2.1* 2.2* 1.8*  --   --   --   --   GLUCOSE 112* 166* 272*  < > 251* 117* 108*  < > = values in this interval not displayed.  CBG (last 3)   Recent Labs  03/24/13 0020 03/24/13 0410 03/24/13 0730  GLUCAP 155* 102* 110*    Scheduled Meds: . antiseptic oral rinse  15 mL Mouth Rinse QID  . aspirin  81 mg Oral Daily  . bisacodyl  10 mg Rectal Once  . carvedilol  37.5 mg Oral BID WC  . chlorhexidine  15 mL Mouth Rinse BID  . clobetasol cream  1 application Topical BID  . clopidogrel  75 mg Oral Q breakfast  . collagenase   Topical Daily  . digoxin  0.25 mg Oral Daily  . ezetimibe  10 mg Oral Daily  . ferrous gluconate  324 mg Oral Q breakfast  . furosemide  40 mg Oral BID  . gabapentin  300 mg Oral BID  . insulin aspart  0-20 Units Subcutaneous Q4H  . losartan  50 mg Oral  Daily  . pantoprazole  40 mg Oral Q1200  . potassium chloride  40 mEq Oral Daily  . predniSONE  10 mg Oral Q breakfast  . sodium chloride  10-40 mL Intracatheter Q12H  . sodium chloride  3 mL Intravenous Q12H  . warfarin  2.5 mg Oral ONCE-1800  . Warfarin - Pharmacist Dosing Inpatient   Does not apply q1800    Continuous Infusions:   Maureen Chatters, RD, LDN Pager #: [phone] After-Hours Pager #: [phone]

## 2013-03-24 NOTE — Progress Notes (Signed)
   VASCULAR PROGRESS NOTE  Pt seen earlier today.   SUBJECTIVE: No complaints  PHYSICAL EXAM: Filed Vitals:   03/23/13 2128 03/24/13 0411 03/24/13 0950 03/24/13 1400  BP: 143/68 147/124 125/68 127/70  Pulse: 103 57  100  Temp: 98.4 F (36.9 C) 97.9 F (36.6 C)  97.6 F (36.4 C)  TempSrc: Oral Oral  Oral  Resp:    18  Height:      Weight:  203 lb 3.2 oz (92.171 kg)    SpO2: 95% 94%  99%   Right groin incision looks fine.  Wound on right lateral malleolus unchanged.    LABS: Lab Results  Component Value Date   WBC 6.4 03/23/2013   HGB 10.0* 03/23/2013   HCT 32.7* 03/23/2013   MCV 85.6 03/23/2013   PLT 152 03/23/2013   Lab Results  Component Value Date   CREATININE 0.70 03/24/2013   Lab Results  Component Value Date   INR 2.48* 03/24/2013   CBG (last 3)   Recent Labs  03/24/13 0730 03/24/13 1203 03/24/13 1637  GLUCAP 110* 214* 245*    Principal Problem:   Shock Active Problems:   Peripheral vascular disease, unspecified   Acute on chronic systolic CHF (congestive heart failure)   Diabetes mellitus type 2 with peripheral artery disease   CAD (coronary artery disease)   S/P CABG x 5   Bradycardia   Hypotension, unspecified   Acute respiratory failure   Atrial flutter   Warfarin anticoagulation   Cardiomyopathy, ischemic   ICD (implantable cardioverter-defibrillator) in place   ASSESSMENT AND PLAN:  * 10 Days Post-Op s/p: Right femoral endarterectomy and angioplasty and stenting of the right common and external iliac arteries.  * Appreciate PTs's help   * The patient had been on ASA and Plavix. He is now also on Coumadin for A flutter. (INR = 2.48). Dr. Myra Gianotti stented a long segment of the right CIA and EIA. In addition, his arteries are small. I think that he at least needs to remain on ASA from a vascular standpoint.  I will discuss Plavix with Dr. Myra Gianotti. If he is cardioverted and does not require Coumadin, then we would definitely want to  continue ASA and Plavix. I agree with Dr Myrtis Ser that we need to weigh the risk of bleeding (on Coumadin, ASA, Plavix) vs the risk of occlusion of his iliac stents without Plavix.   * [facility] has him on our service. I believe that he is on the Cardiologists service.   Cari Caraway Beeper: [phone] 03/24/2013

## 2013-03-24 NOTE — Progress Notes (Signed)
Patient ID: [name] [name], male   DOB: 13-Sep-1950, 62 y.o.   MRN: [id number]    SUBJECTIVE:  One of the main questions still pending relates to the current use of aspirin, Plavix, and Coumadin. Please refer to the last sentences of this note below. Also, his atrial flutter rate today is still intermittently elevated.  Filed Vitals:   03/23/13 1400 03/23/13 1650 03/23/13 2128 03/24/13 0411  BP: 117/77 141/102 143/68 147/124  Pulse: 76 124 103 57  Temp: 98.8 F (37.1 C)  98.4 F (36.9 C) 97.9 F (36.6 C)  TempSrc: Oral  Oral Oral  Resp: 18     Height:      Weight:    203 lb 3.2 oz (92.171 kg)  SpO2: 96%  95% 94%     Intake/Output Summary (Last 24 hours) at 03/24/13 0753 Last data filed at 03/23/13 1800  Gross per 24 hour  Intake    960 ml  Output      0 ml  Net    960 ml    LABS: Basic Metabolic Panel:  Recent Labs  40/98/11 0500 03/24/13 0500  NA 140 140  K 3.1* 3.4*  CL 99 101  CO2 31 30  GLUCOSE 117* 108*  BUN 20 13  CREATININE 0.77 0.70  CALCIUM 8.7 8.6   Liver Function Tests:  Recent Labs  03/21/13 1045 03/24/13 0500  AST 47* 17  ALT 663* 188*  ALKPHOS 183* 116  BILITOT 1.5* 0.7  PROT 6.0 5.8*  ALBUMIN 2.8* 2.5*   No results found for this basename: LIPASE, AMYLASE,  in the last 72 hours CBC:  Recent Labs  03/23/13 0500  WBC 6.4  HGB 10.0*  HCT 32.7*  MCV 85.6  PLT 152   Cardiac Enzymes: No results found for this basename: CKTOTAL, CKMB, CKMBINDEX, TROPONINI,  in the last 72 hours BNP: No components found with this basename: POCBNP,  D-Dimer: No results found for this basename: DDIMER,  in the last 72 hours Hemoglobin A1C: No results found for this basename: HGBA1C,  in the last 72 hours Fasting Lipid Panel: No results found for this basename: CHOL, HDL, LDLCALC, TRIG, CHOLHDL, LDLDIRECT,  in the last 72 hours Thyroid Function Tests: No results found for this basename: TSH, T4TOTAL, FREET3, T3FREE, THYROIDAB,  in the last 72  hours  RADIOLOGY: Ct Angio Chest Pe W/cm &/or Wo Cm  03/16/2013   CLINICAL DATA:  Decreased O2 saturation and bradycardia. Respiratory arrest.  EXAM: CT ANGIOGRAPHY CHEST WITH CONTRAST  TECHNIQUE: Multidetector CT imaging of the chest was performed using the standard protocol during bolus administration of intravenous contrast. Multiplanar CT image reconstructions including MIPs were obtained to evaluate the vascular anatomy.  CONTRAST:  OMNIPAQUE IOHEXOL 350 MG/ML SOLN  COMPARISON:  Chest x-ray dated 03/16/2013  FINDINGS: RA/RV ratio is normal. There is borderline cardiomegaly. There are no pulmonary emboli. There are tiny bilateral pleural effusions with posterior atelectasis in the right upper and lower lobes and minimal atelectasis at the left lung base. Endotracheal tube is in good position.  There is a in 12 mm lymph node in the mediastinal fat just to the left of the aortic arch, nonspecific. No other mediastinal adenopathy. No hilar adenopathy.  There is a small nodule along the right minor fissure consistent with an intrapulmonary lymph node measuring 7 x 4 x 3 mm.  No acute osseous abnormality.  Limited views of the upper abdomen demonstrate abnormal soft tissue stranding in the soft tissues  it at the anterior aspect of the falciform ligament and there is a edema of the gallbladder wall. The possibility of acute cholecystitis should be considered.  Review of the MIP images confirms the above findings.  IMPRESSION: 1. No pulmonary emboli. 2. Edema of the gallbladder wall with soft tissue stranding in the adjacent peritoneal fat suggesting the possibility of acute cholecystitis. 3. Tiny bilateral effusions with bilateral slight atelectasis.   Electronically Signed   By: Geanie Cooley M.D.   On: 03/16/2013 17:03   US Abdomen Complete  03/21/2013   CLINICAL DATA:  Abdominal pain  EXAM: ULTRASOUND ABDOMEN COMPLETE  COMPARISON:  None.  FINDINGS: Gallbladder:  No gallstones or wall thickening  visualized. No sonographic Murphy sign noted. Please note that left lateral decubitus images could not be performed.  Common bile duct:  Diameter: 4 mm in caliber  Liver:  No focal lesion identified. Within normal limits in parenchymal echogenicity.  IVC:  No abnormality visualized.  Pancreas:  The tail was obscured.  No obvious mass.  Spleen:  Size and appearance within normal limits.  Right Kidney:  Length: 12.1 cm in length. Echogenicity within normal limits. No mass or hydronephrosis visualized.  Left Kidney:  Length: 12.0 cm in length. Echogenicity within normal limits. No mass or hydronephrosis visualized.  Abdominal aorta:  Portions of the aorta were obscured. Maximal visualized caliber is 2.4 cm.  Other findings:  None.  IMPRESSION: Limited visualization of the gallbladder, pancreas and aorta. No acute intra-abdominal pathology.   Electronically Signed   By: Maryclare Bean M.D.   On: 03/21/2013 14:22   Dg Chest Port 1 View  03/22/2013   CLINICAL DATA:  Pulmonary edema  EXAM: PORTABLE CHEST - 1 VIEW  COMPARISON:  03/20/2013  FINDINGS: The cardiac silhouette is enlarged. A left internal jugular central venous catheter is appreciated with tip projecting in the region of superior vena cava. Patient is status post median sternotomy and coronary artery bypass grafting. A left chest wall ICD unit is appreciated with tip projecting in the region of the right ventricle. There is no evidence of focal regions of consolidation or focal infiltrates. The osseous structures are unremarkable. The patient's endotracheal tube and NG tube have been removed in the interim.  IMPRESSION: Cardiomegaly without evidence of acute cardiopulmonary disease.   Electronically Signed   By: Salome Holmes M.D.   On: 03/22/2013 07:40   Dg Chest Port 1 View  03/20/2013   CLINICAL DATA:  Hypoxia  EXAM: PORTABLE CHEST - 1 VIEW  COMPARISON:  March 19, 2013  FINDINGS: Endotracheal tube tip is 3.5 cm above the carina. Nasogastric tube in  side-port now extend into the stomach. There is a central catheter with the tip in the superior vena cava just beyond the junction with the left innominate vein. Pacemaker lead tip is attached to the right ventricle. No pneumothorax.  There is atelectasis in the left base. The lungs are otherwise clear. Heart is enlarged with normal pulmonary vascularity. No adenopathy.  IMPRESSION: The tube and catheter positions are as described without pneumothorax. Stable cardiomegaly. Mild left base atelectasis. Lungs are otherwise clear.   Electronically Signed   By: Bretta Bang M.D.   On: 03/20/2013 07:40   Dg Chest Port 1 View  03/19/2013   CLINICAL DATA:  Intubation.  EXAM: PORTABLE CHEST - 1 VIEW  COMPARISON:  03/18/2013.  FINDINGS: Endotracheal tube in stable position. The NG tube has been withdrawn, its tip is now in the this distal esophagus.  Repositioning should be considered. Left IJ line in stable position. Cardiac pacer in stable position. Previously identified interstitial pulmonary edema and pleural effusions have almost completely cleared. Cardiomegaly with normal pulmonary vascularity. No pneumothorax or acute osseous abnormality.  IMPRESSION: 1. Interim partial withdrawal of NG tube, its tip is in the esophagus. This report was phoned to the patient's ICU nurse at 7:42 a.m. on 03/19/2013. 2. Interim near complete clearing of pulmonary interstitial edema and pleural effusions.   Electronically Signed   By: Maisie Fus  Register   On: 03/19/2013 07:43   Dg Chest Port 1 View  03/18/2013   CLINICAL DATA:  Followup atelectasis.  EXAM: PORTABLE CHEST - 1 VIEW  COMPARISON:  03/17/2013  FINDINGS: Prior median sternotomy. Left pacer and endotracheal tube are unchanged. Cardiomegaly. Worsening vascular congestion and interstitial opacities, likely interstitial edema. Suspect small bilateral effusions.  IMPRESSION: New mild interstitial edema.  Small bilateral effusions.   Electronically Signed   By: Charlett Nose  M.D.   On: 03/18/2013 07:32   Dg Chest Port 1 View  03/17/2013   CLINICAL DATA:  Endotracheal tube placement  EXAM: PORTABLE CHEST - 1 VIEW  COMPARISON:  03/16/2013  FINDINGS: Endotracheal tube is again noted 15 mm above the carinal. This could be withdrawn slightly. A nasogastric catheter is noted within the stomach. A pacing device is again seen. Left central venous line is again noted. The lungs are clear. The cardiac shadow remains enlarged.  IMPRESSION: Support apparatus as described.  No acute abnormality is noted.   Electronically Signed   By: Alcide Clever M.D.   On: 03/17/2013 08:05   Dg Chest Port 1 View  03/16/2013   CLINICAL DATA:  Orogastric tube placement  EXAM: PORTABLE CHEST - 1 VIEW  COMPARISON:  Study obtained earlier in the day.  FINDINGS: Orogastric tube tip and side port are in the stomach. Endotracheal tube tip is 1.6 cm above the carina. Central catheter tip is at the junction of the left innominate vein and superior vena cava. Pacemaker leads are attached to the right atrium and right ventricle. No pneumothorax. There is a small left pleural effusion. Lungs are otherwise clear. Heart is enlarged with normal pulmonary vascularity. No adenopathy. Patient is status post internal mammary bypass grafting.  IMPRESSION: Tube and catheter positions as described without pneumothorax. Note that the orogastric tube tip and side port are in the proximal stomach. Small left pleural effusion. Lungs otherwise clear. Cardiomegaly, stable.   Electronically Signed   By: Bretta Bang M.D.   On: 03/16/2013 19:21   Dg Chest Portable 1 View  03/16/2013   CLINICAL DATA:  Central line and endotracheal tube placement.  EXAM: PORTABLE CHEST - 1 VIEW  COMPARISON:  03/16/2013.  FINDINGS: Endotracheal tube noted with its tip approximately 2.4 cm above the carina. Left IJ central line noted in good anatomic position. Cardiac pacer noted with lead tip projecting over right ventricle. Cardiomegaly. No  pulmonary venous congestion. Prior CABG. Mild subsegmental atelectasis left lung base.  IMPRESSION: 1. Interim placement of endotracheal tube and left IJ line with good anatomic positioning.  2. Stable cardiomegaly.  Prior CABG.  Cardiac pacer.  3. Mild left base atelectasis versus infiltrate.   Electronically Signed   By: Maisie Fus  Register   On: 03/16/2013 15:33   Dg Chest Portable 1 View  03/16/2013   CLINICAL DATA:  Atelectasis.  EXAM: PORTABLE CHEST - 1 VIEW  COMPARISON:  03/15/2013.  FINDINGS: Cardiopericardial silhouette is enlarged. CABG markers are present  along with median sternotomy wires. Endotracheal tube and enteric tube have been removed. AICD lead appears unchanged. Left basilar atelectasis appears similar to the prior exam. There is no airspace disease. No pleural effusion is identified. Monitoring leads project over the chest.  IMPRESSION: Cardiomegaly without failure. Interval removal of support apparatus.   Electronically Signed   By: Andreas Newport M.D.   On: 03/16/2013 07:57   Dg Chest Port 1 View  03/15/2013   CLINICAL DATA:  Post right lower extremity vascular surgery  EXAM: PORTABLE CHEST - 1 VIEW  COMPARISON:  03/13/2013; 11/09/2006  FINDINGS: Grossly unchanged enlarged cardiac silhouette and mediastinal contours given a reduced lung volumes and patient rotation. Atherosclerotic plaque within the thoracic aorta. Post median sternotomy. Grossly stable position of left anterior chest wall single lead AICD/pacemaker. Interval intubation with endotracheal tube overlying the tracheal air column with tip approximately 2.5 cm above the carina. Enteric tube tip and side port projects below the left hemidiaphragm. Chronic pulmonary venous congestion without frank evidence of edema. Grossly unchanged left basilar opacities, likely atelectasis. No definite pleural effusion or pneumothorax. Unchanged bones.  IMPRESSION: 1. Appropriately positioned support apparatus as above. No pneumothorax. 2.  Cardiomegaly and pulmonary venous congestion without frank evidence of edema. 3. Unchanged left basilar atelectasis/scar.   Electronically Signed   By: Simonne Come M.D.   On: 03/15/2013 08:14   Dg Chest Port 1 View  03/13/2013   CLINICAL DATA:  CHF.  EXAM: PORTABLE CHEST - 1 VIEW  COMPARISON:  11/09/2006.  FINDINGS: Pacemaker enters from the left with leads in the region of the right ventricle. Battery pack limits evaluation of the left upper lung zone.  Post CABG.  Cardiomegaly.  Remote rib fractures.  No gross pneumothorax.  No segmental consolidation or pulmonary edema.  The patient would eventually benefit from two view chest.  IMPRESSION: Pacemaker enters from the left with leads in the region of the right ventricle. Battery pack limits evaluation of the left upper lung zone.  Post CABG.  Cardiomegaly.  No segmental consolidation or pulmonary edema.   Electronically Signed   By: Bridgett Larsson M.D.   On: 03/13/2013 16:42   Dg Abd Portable 1v  03/19/2013   CLINICAL DATA:  Nasogastric tube placement  EXAM: PORTABLE ABDOMEN - 1 VIEW  COMPARISON:  Earlier film of the same day  FINDINGS: Nasogastric tube has been advanced into the decompressed stomach, looped with the tip in the gastric fundus. No dilated small or large bowel. Lower abdomen excluded. Sternotomy wires and transvenous pacing lead noted.  IMPRESSION: Nasogastric tube loops in the stomach, tip in the fundus.   Electronically Signed   By: Oley Balm M.D.   On: 03/19/2013 15:10   Dg Abd Portable 2v  03/21/2013   CLINICAL DATA:  Lower abdominal pain and nausea  EXAM: PORTABLE ABDOMEN - 2 VIEW  COMPARISON:  Portable abdominal film dated 19 March 2013.  FINDINGS: The bowel gas pattern is nonspecific. There is no evidence of ileus nor obstruction. The bony structures appear normal. There are bilateral common iliac artery stent grafts in place.  IMPRESSION: There is no evidence of bowel obstruction or ileus. No definite evidence of other  acute intra-abdominal abnormality is demonstrated.   Electronically Signed   By: David  [place]   On: 03/21/2013 11:34   US Abdomen Limited Ruq  03/17/2013   CLINICAL DATA:  Possible acute cholecystitis.  EXAM: US ABDOMEN LIMITED - RIGHT UPPER QUADRANT  COMPARISON:  None.  FINDINGS:  Gallbladder  The gallbladder appears adequately distended. The gallbladder wall measures 2.5 mm which is within the limits of normal. There is no pericholecystic fluid or positive sonographic Murphy's sign. No stones are evident.  Common bile duct  Diameter: 5.8 mm.  Liver:  No focal lesion identified. Within normal limits in parenchymal echogenicity. There is no intrahepatic ductal dilation demonstrated.  IMPRESSION: There is no evidence of acute cholecystitis nor of gallstones. The common bile duct and observed portions of the liver appear normal.   Electronically Signed   By: David  [place]   On: 03/17/2013 16:32    PHYSICAL EXAM  the patient is stable sitting in a chair. He is oriented to person time and place. Affect is normal. He hopes to go home soon. He does not feel his increased heart rate. There is no jugulovenous distention. Lungs are clear. Respiratory effort is nonlabored. Cardiac exam reveals S1 and S2. The rate is increased. Abdomen is soft.   TELEMETRY:   I have reviewed telemetry today March 24, 2013. He still has atrial flutter. At times the rate is controlled. At other times his rate is increased.   ASSESSMENT AND PLAN:    Peripheral vascular disease, unspecified      The patient required urgent vascular surgery. There was no embolus proven. The surgery was done for occlusive disease.    Acute on chronic systolic CHF (congestive heart failure)    Currently the volume status is stable.    Diabetes mellitus type 2 with peripheral artery disease   CAD (coronary artery disease)   S/P CABG x 5    Acute respiratory failure     Etiology of the patient's episode of respiratory failure was not  completely clear. This is treated and resolved.    Atrial flutter    Patient is being anticoagulated. The rate seemed better yesterday after the addition of digoxin. However intermittently he has increased rate. He does not feel this. From review of the records, it appears that he probably had this arrhythmia before admission and probably had increased heart rates with it. Today I will be increasing his carvedilol dose. Ultimately cardioversion will be recommended. Coumadin is being used for anticoagulation. I cannot tell how much the atrial flutter plays a role with his left ventricular dysfunction. I'm not concerned about possible bradycardia with his meds because he has an ICD in place. His rapid atrial flutter rate is playing a role in my thought process concerning when he can be discharged home. I have increased his carvedilol dose today. Hopefully this will help to stabilize his rate. He will need early post hospital followup with his local cardiologist after discharge.    Warfarin anticoagulation     Patient is being coumadinized for atrial flutter.    Cardiomyopathy, ischemic    Ejection fraction 20-25% this admission. It is my understanding that on admission the patient was on beta blocker, hydralazine, nitrates, and an ARB/hydrochlorothiazide combination. Currently he is on carvedilol. I will restart ARB in the form of Losartan. He is receiving a diuretic in the form of furosemide at this time.   The patient had previously been on aspirin and Plavix. The diagnosis of atrial flutter is new. Coumadin has now been added. Patient needs Coumadin on an ongoing basis for the atrial flutter. From the cardiac viewpoint, since the patient has not received a recent coronary stent, triple drug therapy would definitely not be recommended. Coumadin alone can be used from the cardiology viewpoint. However, the patient  has just had vascular surgery. The vascular surgeons will need to decide the potential  benefits and risks for the use of aspirin and/or Plavix in addition to Coumadin. If it is decided that only Coumadin is to be used, I feel he should be fully anticoagulated before he goes home.  Willa Rough 03/24/2013 7:53 AM

## 2013-03-24 NOTE — Progress Notes (Signed)
[facility] PROGRESS NOTE  [name] [name] ZOX:[id number] DOB: 02-06-1951 DOA: 03/13/2013 PCP: [name] Skeeters, MD  Assessment/Plan: Acute RLE arterial occlusion - s/p emergent revasc 12/16.   Acute respiratory failure >>resolved Respiratory arrest leading to cardiac arrest 12/18. Unclear etiology. Resolved  AF. RVR >> off cardizem 12/18. Continue with Coreg, digoxin. Coumadin. Cardio following. Coreg increase today. INR at 2.4.   CAD: on aspirin and plavix. Patient also on coumadin for A fib. Will defer to vascular continuation of multiple anticoagulant.   Hypokalemia; K-CL supplement.  Abd distension and pain postop resolved 12/24. prob ileus; Resolved.  Anemia of chronic disease. Hb stable at 10.  Diabetes; SSI.  Diarrhea; improved. C diff negative on 12-23.  Transaminases; trending down.   Disposition per vascular and cardio.   Procedures: Echo 12/18 >> EF 20 to 25%, mild MR, mild LA dilation  CT chest 12/18 >> no PE, tiny effusions with b/l ATX, 7 mm nodule Rt minor fissure, soft tissue stranding around falciform ligament with gallbladder edema ETT 12/16 >> 12/17  R radial A-line 12/16 >>out  ETT 12/18 >>12/22   Antibiotics: Cefuroxime (peri-op) 12/16 >> 12/17    HPI/Subjective: No significant BM, diarrhea improved. Eating better. No chest pain or dyspnea. Wants to go home.   Objective: Filed Vitals:   03/24/13 0950  BP: 125/68  Pulse:   Temp:   Resp:     Intake/Output Summary (Last 24 hours) at 03/24/13 1236 Last data filed at 03/23/13 1800  Gross per 24 hour  Intake    600 ml  Output      0 ml  Net    600 ml   Filed Weights   03/22/13 0500 03/23/13 0554 03/24/13 0411  Weight: 92.1 kg (203 lb 0.7 oz) 92.67 kg (204 lb 4.8 oz) 92.171 kg (203 lb 3.2 oz)    Exam:   General:  No acute distress.   Cardiovascular: S 1, S 2 IRR  Respiratory: Crackles bases.   Abdomen: Bs present, soft, NT  Musculoskeletal: trace edema.   Data  Reviewed: Basic Metabolic Panel:  Recent Labs Lab 03/18/13 0500 03/19/13 0430 03/20/13 0300 03/21/13 0455 03/22/13 0425 03/23/13 0500 03/24/13 0500  NA 135 135 135 139 139 140 140  K 3.8 3.9 3.1* 3.1* 4.0 3.1* 3.4*  CL 101 99 95* 96 98 99 101  CO2 27 27 32 31 29 31 30   GLUCOSE 112* 166* 272* 218* 251* 117* 108*  BUN 17 21 22 15 19 20 13   CREATININE 0.66 0.64 0.59 0.62 0.68 0.77 0.70  CALCIUM 8.2* 8.4 8.6 8.7 8.9 8.7 8.6  MG 2.1 2.0 1.7  --   --   --   --   PHOS 2.1* 2.2* 1.8*  --   --   --   --    Liver Function Tests:  Recent Labs Lab 03/21/13 1045 03/24/13 0500  AST 47* 17  ALT 663* 188*  ALKPHOS 183* 116  BILITOT 1.5* 0.7  PROT 6.0 5.8*  ALBUMIN 2.8* 2.5*   No results found for this basename: LIPASE, AMYLASE,  in the last 168 hours No results found for this basename: AMMONIA,  in the last 168 hours CBC:  Recent Labs Lab 03/18/13 0500 03/19/13 0430 03/20/13 0300 03/21/13 0455 03/23/13 0500  WBC 7.8 8.1 8.4 10.8* 6.4  NEUTROABS  --  6.0 6.3  --   --   HGB 9.6* 9.9* 10.0* 11.7* 10.0*  HCT 30.9* 31.9* 32.0* 37.6* 32.7*  MCV 84.7 86.0 85.8  84.5 85.6  PLT 135* 125* 121* 148* 152   Cardiac Enzymes: No results found for this basename: CKTOTAL, CKMB, CKMBINDEX, TROPONINI,  in the last 168 hours BNP (last 3 results)  Recent Labs  03/14/13 0820 03/16/13 0405 03/19/13 0430  PROBNP 4169.0* 1650.0* 2143.0*   CBG:  Recent Labs Lab 03/23/13 2126 03/24/13 0020 03/24/13 0410 03/24/13 0730 03/24/13 1203  GLUCAP 253* 155* 102* 110* 214*    Recent Results (from the past 240 hour(s))  MRSA PCR SCREENING     Status: None   Collection Time    03/15/13  3:35 AM      Result Value Range Status   MRSA by PCR NEGATIVE  NEGATIVE Final   Comment:            The GeneXpert MRSA Assay (FDA     approved for NASAL specimens     only), is one component of a     comprehensive MRSA colonization     surveillance program. It is not     intended to diagnose MRSA      infection nor to guide or     monitor treatment for     MRSA infections.  CLOSTRIDIUM DIFFICILE BY PCR     Status: None   Collection Time    03/21/13  9:45 AM      Result Value Range Status   C difficile by pcr NEGATIVE  NEGATIVE Final     Studies: No results found.  Scheduled Meds: . antiseptic oral rinse  15 mL Mouth Rinse QID  . aspirin  81 mg Oral Daily  . bisacodyl  10 mg Rectal Once  . carvedilol  37.5 mg Oral BID WC  . chlorhexidine  15 mL Mouth Rinse BID  . clobetasol cream  1 application Topical BID  . clopidogrel  75 mg Oral Q breakfast  . collagenase   Topical Daily  . digoxin  0.25 mg Oral Daily  . ezetimibe  10 mg Oral Daily  . feeding supplement (GLUCERNA SHAKE)  237 mL Oral Q1500  . ferrous gluconate  324 mg Oral Q breakfast  . furosemide  40 mg Oral BID  . gabapentin  300 mg Oral BID  . insulin aspart  0-20 Units Subcutaneous Q4H  . losartan  50 mg Oral Daily  . pantoprazole  40 mg Oral Q1200  . potassium chloride  40 mEq Oral Daily  . predniSONE  10 mg Oral Q breakfast  . sodium chloride  10-40 mL Intracatheter Q12H  . sodium chloride  3 mL Intravenous Q12H  . warfarin  2.5 mg Oral ONCE-1800  . Warfarin - Pharmacist Dosing Inpatient   Does not apply q1800   Continuous Infusions:   Principal Problem:   Shock Active Problems:   Peripheral vascular disease, unspecified   Acute on chronic systolic CHF (congestive heart failure)   Diabetes mellitus type 2 with peripheral artery disease   CAD (coronary artery disease)   S/P CABG x 5   Bradycardia   Hypotension, unspecified   Acute respiratory failure   Atrial flutter   Warfarin anticoagulation   Cardiomyopathy, ischemic   ICD (implantable cardioverter-defibrillator) in place    Time spent: 30 minutes.     [name] [name]  [facility] Pager [phone]. If 7PM-7AM, please contact night-coverage at [email or web address], password [email or web address] 03/24/2013, 12:36 PM  LOS: 11 days

## 2013-03-25 LAB — PROTIME-INR
INR: 1.84 — ABNORMAL HIGH (ref 0.00–1.49)
Prothrombin Time: 20.7 seconds — ABNORMAL HIGH (ref 11.6–15.2)

## 2013-03-25 LAB — GLUCOSE, CAPILLARY: Glucose-Capillary: 292 mg/dL — ABNORMAL HIGH (ref 70–99)

## 2013-03-25 MED ORDER — NITROGLYCERIN 0.4 MG SL SUBL: 0.4000 mg | SUBLINGUAL_TABLET | SUBLINGUAL | Status: AC | PRN

## 2013-03-25 MED ORDER — CARVEDILOL 12.5 MG PO TABS: 37.5000 mg | ORAL_TABLET | Freq: Two times a day (BID) | ORAL | Status: DC

## 2013-03-25 MED ORDER — FUROSEMIDE 40 MG PO TABS: 40.0000 mg | ORAL_TABLET | Freq: Two times a day (BID) | ORAL | Status: DC

## 2013-03-25 MED ORDER — DIGOXIN 125 MCG PO TABS: 0.1250 mg | ORAL_TABLET | Freq: Every day | ORAL | Status: AC

## 2013-03-25 MED ORDER — LOSARTAN POTASSIUM 100 MG PO TABS: 100.0000 mg | ORAL_TABLET | Freq: Every day | ORAL | Status: DC

## 2013-03-25 MED ORDER — DIGOXIN 125 MCG PO TABS: 0.1250 mg | ORAL_TABLET | Freq: Every day | ORAL | Status: DC

## 2013-03-25 MED ORDER — LOSARTAN POTASSIUM 50 MG PO TABS: 100.0000 mg | ORAL_TABLET | Freq: Every day | ORAL | Status: DC

## 2013-03-25 MED ORDER — WARFARIN SODIUM 2.5 MG PO TABS: ORAL_TABLET | ORAL | Status: DC

## 2013-03-25 MED ORDER — POTASSIUM CHLORIDE CRYS ER 20 MEQ PO TBCR: 40.0000 meq | EXTENDED_RELEASE_TABLET | Freq: Every day | ORAL | Status: DC

## 2013-03-25 MED ORDER — GLUCERNA SHAKE PO LIQD: 237.0000 mL | Freq: Every day | ORAL | Status: DC

## 2013-03-25 MED ORDER — INSULIN ASPART 100 UNIT/ML ~~LOC~~ SOLN
0.0000 [IU] | Freq: Three times a day (TID) | SUBCUTANEOUS | Status: DC
  Administered 2013-03-25: 11 [IU] via SUBCUTANEOUS
  Administered 2013-03-25: 4 [IU] via SUBCUTANEOUS

## 2013-03-25 NOTE — Progress Notes (Signed)
   CARE MANAGEMENT NOTE 03/25/2013  Patient:  [name], [name]   Account Number:  [id number]  Date Initiated:  03/15/2013  Documentation initiated by:  [facility]  Subjective/Objective Assessment:   Admitted for artiogram - found to be in Afib - Now pulseless leg.  post op sugery for bypass on vent.     Action/Plan:   Anticipated DC Date:  03/22/2013   Anticipated DC Plan:  HOME W HOME HEALTH SERVICES      DC Planning Services  CM consult      Choice offered to / List presented to:          [facility] arranged  HH-1 RN      Status of service:  In process, will continue to follow Medicare Important Message given?   (If response is "NO", the following Medicare IM given date fields will be blank) Date Medicare IM given:   Date Additional Medicare IM given:    Discharge Disposition:    Per UR Regulation:  Reviewed for med. necessity/level of care/duration of stay  If discussed at Long Length of Stay Meetings, dates discussed:    Comments:  03/25/13 17:20 CM spoke with Spouse, [name] [name] who states any HH agency would be fine.  I explained to her it was going to be challenging to arrange this weekend but I would pass it to a CM on Monday if I was unsuccessful.  She states she understands.  Thus far, [facility], [facility], [facility], [facility] does not have staffing to accomodate referral.  CM is not getting answer at other agencies but will continue to try 03/26/13.  Freddy Jaksch, BSN, CM [phone].   Contact:  [name], [name] [phone]                 [name], [name] Daughter     [phone]   03-20-13 1:30pm Avie Arenas, RNBSN [phone] Reintubated on 12-19 - now extubated again this am.  Plan for discharge home with family, but may need higher level of care.  CM will continue to follow.  03-15-13 10:00 am - Avie Arenas, RNBSN - [phone] Lives in [place] with his spouse.  Afib - post op endarterectomy and stents - on vent post op.   Depending on progression - may need rehab at some level.  CM will continue to follow.

## 2013-03-25 NOTE — Progress Notes (Signed)
PROGRESS NOTE  Subjective:   Pt is a 62 with hx of CAD, CABG ( Followed by Dr. Nedra Hai in [place], [place]) , ICD, DM, HTN PAD chronic systolic CHF, admitted by Dr. [place] for atrial fib with RVR after he presented to [facility] for peripheral vascular procedure by Dr. Edilia Bo.    He had respiratory failure He has had rapid atrial fib.     Objective:    Vital Signs:   Temp:  [97.6 F (36.4 C)-97.7 F (36.5 C)] 97.7 F (36.5 C) (12/27 0500) Pulse Rate:  [44-100] 44 (12/27 0500) Resp:  [16-18] 16 (12/27 0500) BP: (127-166)/(66-70) 166/69 mmHg (12/27 0500) SpO2:  [98 %-99 %] 98 % (12/27 0500) Weight:  [196 lb (88.905 kg)] 196 lb (88.905 kg) (12/27 0500)  Last BM Date: 03/24/13   24-hour weight change: Weight change: -7 lb 3.2 oz (-3.266 kg)  Weight trends: Filed Weights   03/23/13 0554 03/24/13 0411 03/25/13 0500  Weight: 204 lb 4.8 oz (92.67 kg) 203 lb 3.2 oz (92.171 kg) 196 lb (88.905 kg)    Intake/Output:        Physical Exam: BP 166/69  Pulse 44  Temp(Src) 97.7 F (36.5 C) (Oral)  Resp 16  Ht 5\' 9"  (1.753 m)  Wt 196 lb (88.905 kg)  BMI 28.93 kg/m2  SpO2 98%  General: Vital signs reviewed and noted.  Head: Normocephalic, atraumatic.  Eyes: conjunctivae/corneas clear.  EOM's intact.   Throat: normal  Neck:  normal  Lungs:  mostly clear  Heart:  Irregl. Irreg.  HR is better  Abdomen:  Soft, non-tender, non-distended    Extremities: 1-2+ edema of right lower leg.    Neurologic: A&O X3, CN II - XII are grossly intact.   Psych: Normal     Labs: BMET:  Recent Labs  03/23/13 0500 03/24/13 0500  NA 140 140  K 3.1* 3.4*  CL 99 101  CO2 31 30  GLUCOSE 117* 108*  BUN 20 13  CREATININE 0.77 0.70  CALCIUM 8.7 8.6    Liver function tests:  Recent Labs  03/24/13 0500  AST 17  ALT 188*  ALKPHOS 116  BILITOT 0.7  PROT 5.8*  ALBUMIN 2.5*   No results found for this basename: LIPASE, AMYLASE,  in the last 72 hours  CBC:  Recent Labs   03/23/13 0500  WBC 6.4  HGB 10.0*  HCT 32.7*  MCV 85.6  PLT 152    Cardiac Enzymes: No results found for this basename: CKTOTAL, CKMB, TROPONINI,  in the last 72 hours  Coagulation Studies:  Recent Labs  03/23/13 0500 03/24/13 0500 03/25/13 0605  LABPROT 35.7* 26.0* 20.7*  INR 3.76* 2.48* 1.84*    Other:   Other results:  Tele:  Atrial fib.     Medications:    Infusions:    Scheduled Medications: . antiseptic oral rinse  15 mL Mouth Rinse QID  . aspirin  81 mg Oral Daily  . bisacodyl  10 mg Rectal Once  . carvedilol  37.5 mg Oral BID WC  . chlorhexidine  15 mL Mouth Rinse BID  . clobetasol cream  1 application Topical BID  . clopidogrel  75 mg Oral Q breakfast  . collagenase   Topical Daily  . digoxin  0.25 mg Oral Daily  . ezetimibe  10 mg Oral Daily  . feeding supplement (GLUCERNA SHAKE)  237 mL Oral Q1500  . ferrous gluconate  324 mg Oral Q breakfast  . furosemide  40 mg Oral BID  . gabapentin  300 mg Oral BID  . insulin aspart  0-20 Units Subcutaneous TID WC  . losartan  50 mg Oral Daily  . pantoprazole  40 mg Oral Q1200  . potassium chloride  40 mEq Oral Daily  . predniSONE  10 mg Oral Q breakfast  . sodium chloride  10-40 mL Intracatheter Q12H  . sodium chloride  3 mL Intravenous Q12H  . Warfarin - Pharmacist Dosing Inpatient   Does not apply q1800    Assessment/ Plan:   Principal Problem:   Shock Active Problems:   Peripheral vascular disease, unspecified   Acute on chronic systolic CHF (congestive heart failure)   Diabetes mellitus type 2 with peripheral artery disease   CAD (coronary artery disease)   S/P CABG x 5   Bradycardia   Hypotension, unspecified   Acute respiratory failure   Atrial flutter   Warfarin anticoagulation   Cardiomyopathy, ischemic   ICD (implantable cardioverter-defibrillator) in place  1. Chronic systolic CHF:  EF 20-25% by echo.  Will continue to get HR under control.   On coreg 37.5 bid.  I have increase  Losartan to 100 for his persistent HTN.    He will follow up with his cardiologist in [place], [place]  He ambulated in the halls without problems yesterday   2. Atrial flutter:  Rate is still high - will decrease his digoxin  He is on coumadin.  OK to go home with his current INR of 1.84.  He should call his doctor Monday and get his INR checked.  He may be able to be TEE / cardioverted this week or next.  Will leave this up to his cardiologist.   Will decrease his dose of digoxin.   3. PVD:  S/ p right femoral endarterectomy and stenting of right :  On Plavix and ASA per vascular surgery.   I have discussed with Dr. Edilia Bo.  He had fairly extensive PVD stenting in small arteries and would preferably stay on ASA and Plavix.  He needs coumadin for A-Flutter.  I would recommend that he stay on all 3 - coumadin, ASA, plavix for 1 month and then DC the ASA.   He can follow up with his cardiologist in [place], [place] for further recs.    OK to go home today. Disposition:  Length of Stay: 12  Vesta Mixer, Montez Hageman., MD, [facility] 03/25/2013, 10:56 AM Office [phone] Pager [phone]

## 2013-03-25 NOTE — Progress Notes (Signed)
ANTICOAGULATION CONSULT NOTE   Pharmacy Consult for Coumadin Indication: atrial fibrillation  Allergies  Allergen Reactions  . Lipitor [Atorvastatin]     Weak muscle  . Lodine [Etodolac] Nausea And Vomiting  . Methotrexate Derivatives Nausea And Vomiting  . Ranitidine Hcl Nausea Only  . Remicade [Infliximab] Other (See Comments)    Chills and shakes     Patient Measurements: Height: 5\' 9"  (175.3 cm) Weight: 196 lb (88.905 kg) IBW/kg (Calculated) : 70.7 Heparin Dosing Weight: 82 kg  Vital Signs: Temp: 97.7 F (36.5 C) (12/27 0500) BP: 166/69 mmHg (12/27 0500) Pulse Rate: 44 (12/27 0500)  Labs:  Recent Labs  03/23/13 0500 03/24/13 0500 03/25/13 0605  HGB 10.0*  --   --   HCT 32.7*  --   --   PLT 152  --   --   LABPROT 35.7* 26.0* 20.7*  INR 3.76* 2.48* 1.84*  CREATININE 0.77 0.70  --     Estimated Creatinine Clearance: 108.3 ml/min (by C-G formula based on Cr of 0.7).  Assessment: 26 YOM with AFib, s/p femoral endarterectomy. On coumadin for anticoagulation.  INR now subtherapeutic. Patient has been very difficult to manage, looking at prior labs looks as though pt LFTs were elevated. Recent LFT now trending down. Coumadin restarted yesterday, per cards patient will continue on asa and plavix. No bleeding issues have been noted, Pt will follow up with coumadin clinic outpatient on Monday and potential for cardioversion in the next week or two.   Goal of Therapy:  Monitor platelets by anticoagulation protocol: Yes INR 2-3   Plan:  1. Plan to discharge home on Warfarin 5mg  PO daily until follow up with coumadin clinic 2. Monitor signs for bleed as patient is continuing asa and plavix and at high risk for bleed.

## 2013-03-25 NOTE — Discharge Summary (Signed)
Physician Discharge Summary  [name] [name] AVW:[id number] DOB: July 09, 1950 DOA: 03/13/2013  PCP: [name] Skeeters, MD  Admit date: 03/13/2013 Discharge date: 03/25/2013  Time spent: 35  minutes  Recommendations for Outpatient Follow-up:  1. Needs to follow up with Dr [name] [name] post surgery.  2. Needs to follow up with his primary cardiologist next week. Needs adjustments of his medications for A fib, follow HR.  Need B-met to follow K level, INR and adjust coumadin as needed.    Discharge Diagnoses:    Right femoral endarterectomy and stenting of right    A fib RVR   Respiratory Arrest leading to cardiac arrest.    Acute on chronic systolic CHF (congestive heart failure)   Shock   Peripheral vascular disease, unspecified   Diabetes mellitus type 2 with peripheral artery disease   CAD (coronary artery disease)   S/P CABG x 5   Bradycardia   Acute respiratory failure   Atrial flutter   Warfarin anticoagulation   Cardiomyopathy, ischemic   ICD (implantable cardioverter-defibrillator) in place   Discharge Condition: Stable.   Diet recommendation: Heart Healthy.   Filed Weights   03/23/13 0554 03/24/13 0411 03/25/13 0500  Weight: 92.67 kg (204 lb 4.8 oz) 92.171 kg (203 lb 3.2 oz) 88.905 kg (196 lb)    History of present illness:  [name] is seen at the request of Dr. Edilia [name] for evaluation and treatment of atrial fibrillation with a rapid ventricular response. Patient is a 62 year old white male with extensive cardiac history. His primary cardiologist is Dr. Nedra [name] in [place]. Patient has a history of myocardial infarction in 2000. He presented with acute coronary syndrome in 2006 and subsequently underwent coronary bypass surgery x5 in [facility]. He has a history of congestive heart failure and is status post ICD in January 2014. He denies any prior history of atrial fibrillation. Patient reports over the past 2 weeks he has had some upper respiratory  symptoms including cough productive of phlegm. He complains of increased chest tightness particularly in the early morning that seems to resolve once he has coughed up some phlegm. He does complain of increasing edema, weight gain, and increased abdominal girth. He takes Lasix on a when necessary basis. He took one dose at the end of last week but did not get a good response. Patient presented today for a peripheral arteriogram. He was found to be in atrial fibrillation with a rapid ventricular response. He was also noted on his office visit on December 10 and his pulse rate was 136 beats per minute. Patient is unaware of any palpitations or chest pain other than as noted above.  Patient does have a significant history of per arterial disease. He's had previous stenting of the left external iliac artery in 2011. He also has SFA occlusion on the left. He presents with resting claudication involving his right leg and a nonhealing ulcer on the right ankle. He has been on chronic aspirin and Plavix.  Additional medical problems include diabetes mellitus, hypertension, hyperlipidemia, congestive heart failure, chronic kidney disease, and rheumatoid arthritis. He also has chronic back pain and is being evaluated for possible back surgery.   Hospital Course:  [name] with extensive cardiac and PVD history presented 12/15 for scheduled arteriogram of BLEs and was found to be in [facility]. Admitted by Cardiology for mgmt with plan for arteriogram to be performed later in week. Developed acute arterial occlusion of RLE and underwent emergent R common femoral, profundofemoral, and superficial femoral endarterectomy with  patch angioplasty. Remained intubated post op and PCCM asked to consult. He was intubated 12-16 until 12-22. Hospital events:  12/16 Acute RLE ischemia. To OR-- Emergent right common femoral, profundofemoral, and superficial femoral endarterectomy with patch angioplasty ([name] [name]), He was extubated 12-17.   Subsequently on 12/18 respiratory arrest, reintubated. EF 25% oN ECHO. Extubated 12-22.   Acute RLE arterial occlusion - s/p emergent revasc 12/16. He had fairly extensive PVD stenting in small arteries and would preferably stay on ASA and Plavix. He needs coumadin for A-Flutter. Cardio and vascular recommend that he stay on all 3 - coumadin, ASA, plavix for 1 month and then DC the ASA.   Acute respiratory failure >>resolved Respiratory arrest leading to cardiac arrest 12/18. Unclear etiology. Resolved  AF. RVR >> off cardizem 12/18. Continue with Coreg, digoxin. Coumadin. Cardio following. Coreg increase to 37.5 BID. Digoxin decrease to 0.25.  INR at 1.8. He will be discharge on 5 mg Coumadin daily. Need INR on Monday and adjust dose as needed.  CAD: Continue with  aspirin and plavix. Patient also on coumadin for A fib.  Hypokalemia; K-CL supplement.  Abd distension and pain postop resolved 12/24. prob ileus; Resolved.  Anemia of chronic disease. Hb stable at 10.  Diabetes; SSI. Resume home medications at discharge.  Diarrhea; improved. C diff negative on 12-23.  Transaminases; trending down. Increase level probably secondary to shock liver.     Procedures: Echo 12/18 >> EF 20 to 25%, mild MR, mild LA dilation  CT chest 12/18 >> no PE, tiny effusions with b/l ATX, 7 mm nodule Rt minor fissure, soft tissue stranding around falciform ligament with gallbladder edema  ETT 12/16 >> 12/17  R radial A-line 12/16 >>out  ETT 12/18 >>12/22   Consultations:  Vascular   Cardiology  CCM  Discharge Exam: Filed Vitals:   03/25/13 0500  BP: 166/69  Pulse: 44  Temp: 97.7 F (36.5 C)  Resp: 16    General: No distress.  Cardiovascular: S 1, S 2 RRR Respiratory: CTA  Discharge Instructions  Discharge Orders   Future Orders Complete By Expires   Diet - low sodium heart healthy  As directed    Increase activity slowly  As directed        Medication List    STOP taking these  medications       ADVIL PO     leflunomide 20 MG tablet  Commonly known as:  ARAVA     ORENCIA [place]     valsartan-hydrochlorothiazide 80-12.5 MG per tablet  Commonly known as:  DIOVAN-HCT      TAKE these medications       aspirin EC 81 MG tablet  Take 81 mg by mouth daily.     CALCIUM 1000 + D PO  Take 1 tablet by mouth daily.     calcium carbonate 500 MG chewable tablet  Commonly known as:  TUMS - dosed in mg elemental calcium  Chew 1 tablet by mouth as needed for indigestion.     carvedilol 12.5 MG tablet  Commonly known as:  COREG  Take 3 tablets (37.5 mg total) by mouth 2 (two) times daily with a meal.     clobetasol cream 0.05 %  Commonly known as:  TEMOVATE  Apply 1 application topically 2 (two) times daily.     clopidogrel 75 MG tablet  Commonly known as:  PLAVIX  Take 75 mg by mouth daily.     digoxin 0.125 MG tablet  Commonly known as:  LANOXIN  Take 1 tablet (0.125 mg total) by mouth daily.  Start taking on:  03/26/2013     ezetimibe 10 MG tablet  Commonly known as:  ZETIA  Take 10 mg by mouth daily.     feeding supplement (GLUCERNA SHAKE) Liqd  Take 237 mLs by mouth daily at 3 pm.     ferrous gluconate 325 MG tablet  Commonly known as:  FERGON  Take 325 mg by mouth daily with breakfast.     fish oil-omega-3 fatty acids 1000 MG capsule  Take 2 g by mouth daily.     furosemide 40 MG tablet  Commonly known as:  LASIX  Take 1 tablet (40 mg total) by mouth 2 (two) times daily.     gabapentin 600 MG tablet  Commonly known as:  NEURONTIN  Take 1-2 tablets by mouth 2 (two) times daily. 600 mg in am and 1200 mg at night.     glyBURIDE-metformin 5-500 MG per tablet  Commonly known as:  GLUCOVANCE  Take 1-2 tablets by mouth 2 (two) times daily with a meal. Takes 2 in the am and 1 tab in the pm.     hydrALAZINE 25 MG tablet  Commonly known as:  APRESOLINE  Take 1 tablet by mouth 2 (two) times daily.     HYDROcodone-acetaminophen 10-325 MG per  tablet  Commonly known as:  NORCO  Take 1 tablet by mouth 5 (five) times daily.     isosorbide mononitrate 60 MG 24 hr tablet  Commonly known as:  IMDUR  Take 1 tablet by mouth daily.     losartan 100 MG tablet  Commonly known as:  COZAAR  Take 1 tablet (100 mg total) by mouth daily.  Start taking on:  03/26/2013     nitroGLYCERIN 0.4 MG SL tablet  Commonly known as:  NITROSTAT  Place 1 tablet (0.4 mg total) under the tongue every 5 (five) minutes x 3 doses as needed for chest pain.     pantoprazole 40 MG tablet  Commonly known as:  PROTONIX  Take 80 mg by mouth 2 (two) times daily.     potassium chloride SA 20 MEQ tablet  Commonly known as:  K-DUR,KLOR-CON  Take 2 tablets (40 mEq total) by mouth daily.     predniSONE 5 MG tablet  Commonly known as:  DELTASONE  Take 5 mg by mouth 2 (two) times daily.     sitaGLIPtin 100 MG tablet  Commonly known as:  JANUVIA  Take 100 mg by mouth daily.     warfarin 2.5 MG tablet  Commonly known as:  COUMADIN  Take 2 tablet by mouth daily. Need INR on 12-29 and adjust coumadin doses as needed.       Allergies  Allergen Reactions  . Lipitor [Atorvastatin]     Weak muscle  . Lodine [Etodolac] Nausea And Vomiting  . Methotrexate Derivatives Nausea And Vomiting  . Ranitidine Hcl Nausea Only  . Remicade [Infliximab] Other (See Comments)    Chills and shakes        Follow-up Information   Follow up with STAMBAUGH,MERRIS, MD In 3 days.   Specialty:  Family Medicine   Contact information:   [address] [place] [address] [phone]       Please follow up. (follow up with cardiologist next week. )       Follow up with DICKSON,CHRISTOPHER S, MD. (call to arrange appointment. )    Specialty:  Vascular Surgery   Contact information:  [address] [place] [address] [phone]        The results of significant diagnostics from this hospitalization (including imaging, microbiology, ancillary and laboratory)  are listed below for reference.    Significant Diagnostic Studies: Ct Angio Chest Pe W/cm &/or Wo Cm  03/16/2013   CLINICAL DATA:  Decreased O2 saturation and bradycardia. Respiratory arrest.  EXAM: CT ANGIOGRAPHY CHEST WITH CONTRAST  TECHNIQUE: Multidetector CT imaging of the chest was performed using the standard protocol during bolus administration of intravenous contrast. Multiplanar CT image reconstructions including MIPs were obtained to evaluate the vascular anatomy.  CONTRAST:  OMNIPAQUE IOHEXOL 350 MG/ML SOLN  COMPARISON:  Chest x-ray dated 03/16/2013  FINDINGS: RA/RV ratio is normal. There is borderline cardiomegaly. There are no pulmonary emboli. There are tiny bilateral pleural effusions with posterior atelectasis in the right upper and lower lobes and minimal atelectasis at the left lung base. Endotracheal tube is in good position.  There is a in 12 mm lymph node in the mediastinal fat just to the left of the aortic arch, nonspecific. No other mediastinal adenopathy. No hilar adenopathy.  There is a small nodule along the right minor fissure consistent with an intrapulmonary lymph node measuring 7 x 4 x 3 mm.  No acute osseous abnormality.  Limited views of the upper abdomen demonstrate abnormal soft tissue stranding in the soft tissues it at the anterior aspect of the falciform ligament and there is a edema of the gallbladder wall. The possibility of acute cholecystitis should be considered.  Review of the MIP images confirms the above findings.  IMPRESSION: 1. No pulmonary emboli. 2. Edema of the gallbladder wall with soft tissue stranding in the adjacent peritoneal fat suggesting the possibility of acute cholecystitis. 3. Tiny bilateral effusions with bilateral slight atelectasis.   Electronically Signed   By: Geanie Cooley M.D.   On: 03/16/2013 17:03   US Abdomen Complete  03/21/2013   CLINICAL DATA:  Abdominal pain  EXAM: ULTRASOUND ABDOMEN COMPLETE  COMPARISON:  None.  FINDINGS:  Gallbladder:  No gallstones or wall thickening visualized. No sonographic Murphy sign noted. Please note that left lateral decubitus images could not be performed.  Common bile duct:  Diameter: 4 mm in caliber  Liver:  No focal lesion identified. Within normal limits in parenchymal echogenicity.  IVC:  No abnormality visualized.  Pancreas:  The tail was obscured.  No obvious mass.  Spleen:  Size and appearance within normal limits.  Right Kidney:  Length: 12.1 cm in length. Echogenicity within normal limits. No mass or hydronephrosis visualized.  Left Kidney:  Length: 12.0 cm in length. Echogenicity within normal limits. No mass or hydronephrosis visualized.  Abdominal aorta:  Portions of the aorta were obscured. Maximal visualized caliber is 2.4 cm.  Other findings:  None.  IMPRESSION: Limited visualization of the gallbladder, pancreas and aorta. No acute intra-abdominal pathology.   Electronically Signed   By: Maryclare Bean M.D.   On: 03/21/2013 14:22   Dg Chest Port 1 View  03/22/2013   CLINICAL DATA:  Pulmonary edema  EXAM: PORTABLE CHEST - 1 VIEW  COMPARISON:  03/20/2013  FINDINGS: The cardiac silhouette is enlarged. A left internal jugular central venous catheter is appreciated with tip projecting in the region of superior vena cava. Patient is status post median sternotomy and coronary artery bypass grafting. A left chest wall ICD unit is appreciated with tip projecting in the region of the right ventricle. There is no evidence of focal regions of  consolidation or focal infiltrates. The osseous structures are unremarkable. The patient's endotracheal tube and NG tube have been removed in the interim.  IMPRESSION: Cardiomegaly without evidence of acute cardiopulmonary disease.   Electronically Signed   By: Salome Holmes M.D.   On: 03/22/2013 07:40   Dg Chest Port 1 View  03/20/2013   CLINICAL DATA:  Hypoxia  EXAM: PORTABLE CHEST - 1 VIEW  COMPARISON:  March 19, 2013  FINDINGS: Endotracheal tube tip is  3.5 cm above the carina. Nasogastric tube in side-port now extend into the stomach. There is a central catheter with the tip in the superior vena cava just beyond the junction with the left innominate vein. Pacemaker lead tip is attached to the right ventricle. No pneumothorax.  There is atelectasis in the left base. The lungs are otherwise clear. Heart is enlarged with normal pulmonary vascularity. No adenopathy.  IMPRESSION: The tube and catheter positions are as described without pneumothorax. Stable cardiomegaly. Mild left base atelectasis. Lungs are otherwise clear.   Electronically Signed   By: Bretta Bang M.D.   On: 03/20/2013 07:40   Dg Chest Port 1 View  03/19/2013   CLINICAL DATA:  Intubation.  EXAM: PORTABLE CHEST - 1 VIEW  COMPARISON:  03/18/2013.  FINDINGS: Endotracheal tube in stable position. The NG tube has been withdrawn, its tip is now in the this distal esophagus. Repositioning should be considered. Left IJ line in stable position. Cardiac pacer in stable position. Previously identified interstitial pulmonary edema and pleural effusions have almost completely cleared. Cardiomegaly with normal pulmonary vascularity. No pneumothorax or acute osseous abnormality.  IMPRESSION: 1. Interim partial withdrawal of NG tube, its tip is in the esophagus. This report was phoned to the patient's ICU nurse at 7:42 a.m. on 03/19/2013. 2. Interim near complete clearing of pulmonary interstitial edema and pleural effusions.   Electronically Signed   By: Maisie Fus  Register   On: 03/19/2013 07:43   Dg Chest Port 1 View  03/18/2013   CLINICAL DATA:  Followup atelectasis.  EXAM: PORTABLE CHEST - 1 VIEW  COMPARISON:  03/17/2013  FINDINGS: Prior median sternotomy. Left pacer and endotracheal tube are unchanged. Cardiomegaly. Worsening vascular congestion and interstitial opacities, likely interstitial edema. Suspect small bilateral effusions.  IMPRESSION: New mild interstitial edema.  Small bilateral  effusions.   Electronically Signed   By: Charlett Nose M.D.   On: 03/18/2013 07:32   Dg Chest Port 1 View  03/17/2013   CLINICAL DATA:  Endotracheal tube placement  EXAM: PORTABLE CHEST - 1 VIEW  COMPARISON:  03/16/2013  FINDINGS: Endotracheal tube is again noted 15 mm above the carinal. This could be withdrawn slightly. A nasogastric catheter is noted within the stomach. A pacing device is again seen. Left central venous line is again noted. The lungs are clear. The cardiac shadow remains enlarged.  IMPRESSION: Support apparatus as described.  No acute abnormality is noted.   Electronically Signed   By: Alcide Clever M.D.   On: 03/17/2013 08:05   Dg Chest Port 1 View  03/16/2013   CLINICAL DATA:  Orogastric tube placement  EXAM: PORTABLE CHEST - 1 VIEW  COMPARISON:  Study obtained earlier in the day.  FINDINGS: Orogastric tube tip and side port are in the stomach. Endotracheal tube tip is 1.6 cm above the carina. Central catheter tip is at the junction of the left innominate vein and superior vena cava. Pacemaker leads are attached to the right atrium and right ventricle. No pneumothorax. There is a  small left pleural effusion. Lungs are otherwise clear. Heart is enlarged with normal pulmonary vascularity. No adenopathy. Patient is status post internal mammary bypass grafting.  IMPRESSION: Tube and catheter positions as described without pneumothorax. Note that the orogastric tube tip and side port are in the proximal stomach. Small left pleural effusion. Lungs otherwise clear. Cardiomegaly, stable.   Electronically Signed   By: Bretta Bang M.D.   On: 03/16/2013 19:21   Dg Chest Portable 1 View  03/16/2013   CLINICAL DATA:  Central line and endotracheal tube placement.  EXAM: PORTABLE CHEST - 1 VIEW  COMPARISON:  03/16/2013.  FINDINGS: Endotracheal tube noted with its tip approximately 2.4 cm above the carina. Left IJ central line noted in good anatomic position. Cardiac pacer noted with lead tip  projecting over right ventricle. Cardiomegaly. No pulmonary venous congestion. Prior CABG. Mild subsegmental atelectasis left lung base.  IMPRESSION: 1. Interim placement of endotracheal tube and left IJ line with good anatomic positioning.  2. Stable cardiomegaly.  Prior CABG.  Cardiac pacer.  3. Mild left base atelectasis versus infiltrate.   Electronically Signed   By: Maisie Fus  Register   On: 03/16/2013 15:33   Dg Chest Portable 1 View  03/16/2013   CLINICAL DATA:  Atelectasis.  EXAM: PORTABLE CHEST - 1 VIEW  COMPARISON:  03/15/2013.  FINDINGS: Cardiopericardial silhouette is enlarged. CABG markers are present along with median sternotomy wires. Endotracheal tube and enteric tube have been removed. AICD lead appears unchanged. Left basilar atelectasis appears similar to the prior exam. There is no airspace disease. No pleural effusion is identified. Monitoring leads project over the chest.  IMPRESSION: Cardiomegaly without failure. Interval removal of support apparatus.   Electronically Signed   By: Andreas Newport M.D.   On: 03/16/2013 07:57   Dg Chest Port 1 View  03/15/2013   CLINICAL DATA:  Post right lower extremity vascular surgery  EXAM: PORTABLE CHEST - 1 VIEW  COMPARISON:  03/13/2013; 11/09/2006  FINDINGS: Grossly unchanged enlarged cardiac silhouette and mediastinal contours given a reduced lung volumes and patient rotation. Atherosclerotic plaque within the thoracic aorta. Post median sternotomy. Grossly stable position of left anterior chest wall single lead AICD/pacemaker. Interval intubation with endotracheal tube overlying the tracheal air column with tip approximately 2.5 cm above the carina. Enteric tube tip and side port projects below the left hemidiaphragm. Chronic pulmonary venous congestion without frank evidence of edema. Grossly unchanged left basilar opacities, likely atelectasis. No definite pleural effusion or pneumothorax. Unchanged bones.  IMPRESSION: 1. Appropriately  positioned support apparatus as above. No pneumothorax. 2. Cardiomegaly and pulmonary venous congestion without frank evidence of edema. 3. Unchanged left basilar atelectasis/scar.   Electronically Signed   By: Simonne Come M.D.   On: 03/15/2013 08:14   Dg Chest Port 1 View  03/13/2013   CLINICAL DATA:  CHF.  EXAM: PORTABLE CHEST - 1 VIEW  COMPARISON:  11/09/2006.  FINDINGS: Pacemaker enters from the left with leads in the region of the right ventricle. Battery pack limits evaluation of the left upper lung zone.  Post CABG.  Cardiomegaly.  Remote rib fractures.  No gross pneumothorax.  No segmental consolidation or pulmonary edema.  The patient would eventually benefit from two view chest.  IMPRESSION: Pacemaker enters from the left with leads in the region of the right ventricle. Battery pack limits evaluation of the left upper lung zone.  Post CABG.  Cardiomegaly.  No segmental consolidation or pulmonary edema.   Electronically Signed   By: Brett Canales  Constance Goltz M.D.   On: 03/13/2013 16:42   Dg Abd Portable 1v  03/19/2013   CLINICAL DATA:  Nasogastric tube placement  EXAM: PORTABLE ABDOMEN - 1 VIEW  COMPARISON:  Earlier film of the same day  FINDINGS: Nasogastric tube has been advanced into the decompressed stomach, looped with the tip in the gastric fundus. No dilated small or large bowel. Lower abdomen excluded. Sternotomy wires and transvenous pacing lead noted.  IMPRESSION: Nasogastric tube loops in the stomach, tip in the fundus.   Electronically Signed   By: Oley Balm M.D.   On: 03/19/2013 15:10   Dg Abd Portable 2v  03/21/2013   CLINICAL DATA:  Lower abdominal pain and nausea  EXAM: PORTABLE ABDOMEN - 2 VIEW  COMPARISON:  Portable abdominal film dated 19 March 2013.  FINDINGS: The bowel gas pattern is nonspecific. There is no evidence of ileus nor obstruction. The bony structures appear normal. There are bilateral common iliac artery stent grafts in place.  IMPRESSION: There is no evidence of  bowel obstruction or ileus. No definite evidence of other acute intra-abdominal abnormality is demonstrated.   Electronically Signed   By: David  [place]   On: 03/21/2013 11:34   US Abdomen Limited Ruq  03/17/2013   CLINICAL DATA:  Possible acute cholecystitis.  EXAM: US ABDOMEN LIMITED - RIGHT UPPER QUADRANT  COMPARISON:  None.  FINDINGS: Gallbladder  The gallbladder appears adequately distended. The gallbladder wall measures 2.5 mm which is within the limits of normal. There is no pericholecystic fluid or positive sonographic Murphy's sign. No stones are evident.  Common bile duct  Diameter: 5.8 mm.  Liver:  No focal lesion identified. Within normal limits in parenchymal echogenicity. There is no intrahepatic ductal dilation demonstrated.  IMPRESSION: There is no evidence of acute cholecystitis nor of gallstones. The common bile duct and observed portions of the liver appear normal.   Electronically Signed   By: David  [place]   On: 03/17/2013 16:32    Microbiology: Recent Results (from the past 240 hour(s))  CLOSTRIDIUM DIFFICILE BY PCR     Status: None   Collection Time    03/21/13  9:45 AM      Result Value Range Status   C difficile by pcr NEGATIVE  NEGATIVE Final     Labs: Basic Metabolic Panel:  Recent Labs Lab 03/19/13 0430 03/20/13 0300 03/21/13 0455 03/22/13 0425 03/23/13 0500 03/24/13 0500  NA 135 135 139 139 140 140  K 3.9 3.1* 3.1* 4.0 3.1* 3.4*  CL 99 95* 96 98 99 101  CO2 27 32 31 29 31 30   GLUCOSE 166* 272* 218* 251* 117* 108*  BUN 21 22 15 19 20 13   CREATININE 0.64 0.59 0.62 0.68 0.77 0.70  CALCIUM 8.4 8.6 8.7 8.9 8.7 8.6  MG 2.0 1.7  --   --   --   --   PHOS 2.2* 1.8*  --   --   --   --    Liver Function Tests:  Recent Labs Lab 03/21/13 1045 03/24/13 0500  AST 47* 17  ALT 663* 188*  ALKPHOS 183* 116  BILITOT 1.5* 0.7  PROT 6.0 5.8*  ALBUMIN 2.8* 2.5*   No results found for this basename: LIPASE, AMYLASE,  in the last 168 hours No results found for  this basename: AMMONIA,  in the last 168 hours CBC:  Recent Labs Lab 03/19/13 0430 03/20/13 0300 03/21/13 0455 03/23/13 0500  WBC 8.1 8.4 10.8* 6.4  NEUTROABS 6.0 6.3  --   --  HGB 9.9* 10.0* 11.7* 10.0*  HCT 31.9* 32.0* 37.6* 32.7*  MCV 86.0 85.8 84.5 85.6  PLT 125* 121* 148* 152   Cardiac Enzymes: No results found for this basename: CKTOTAL, CKMB, CKMBINDEX, TROPONINI,  in the last 168 hours BNP: BNP (last 3 results)  Recent Labs  03/14/13 0820 03/16/13 0405 03/19/13 0430  PROBNP 4169.0* 1650.0* 2143.0*   CBG:  Recent Labs Lab 03/24/13 1203 03/24/13 1637 03/24/13 2100 03/25/13 0741 03/25/13 1152  GLUCAP 214* 245* 297* 174* 292*       Signed:  Kaulana Brindle  [facility] 03/25/2013, 12:24 PM

## 2013-03-25 NOTE — Progress Notes (Signed)
   VASCULAR PROGRESS NOTE  SUBJECTIVE: No complaints.  PHYSICAL EXAM: Filed Vitals:   03/24/13 0950 03/24/13 1400 03/24/13 2100 03/25/13 0500  BP: 125/68 127/70 151/66 166/69  Pulse:  100 98 44  Temp:  97.6 F (36.4 C) 97.7 F (36.5 C) 97.7 F (36.5 C)  TempSrc:  Oral    Resp:  18 18 16   Height:      Weight:    196 lb (88.905 kg)  SpO2:  99% 98% 98%   Right groin incision looks fine.  Wounds on right lateral malleolus unchanged. Right foot warm and well-perfused.  LABS: Lab Results  Component Value Date   WBC 6.4 03/23/2013   HGB 10.0* 03/23/2013   HCT 32.7* 03/23/2013   MCV 85.6 03/23/2013   PLT 152 03/23/2013   Lab Results  Component Value Date   CREATININE 0.70 03/24/2013   Lab Results  Component Value Date   INR 1.84* 03/25/2013   CBG (last 3)   Recent Labs  03/24/13 1637 03/24/13 2100 03/25/13 0741  GLUCAP 245* 297* 174*    Principal Problem:   Shock Active Problems:   Peripheral vascular disease, unspecified   Acute on chronic systolic CHF (congestive heart failure)   Diabetes mellitus type 2 with peripheral artery disease   CAD (coronary artery disease)   S/P CABG x 5   Bradycardia   Hypotension, unspecified   Acute respiratory failure   Atrial flutter   Warfarin anticoagulation   Cardiomyopathy, ischemic   ICD (implantable cardioverter-defibrillator) in place   ASSESSMENT AND PLAN:  * 11 Days Post-Op s/p: Right femoral endarterectomy and angioplasty and stenting of the right common and external iliac arteries.   * Appreciate PTx's help. The patient wants to go home and does not think that he needs home health physical therapy. It sounds like he has been doing well with physical therapy.   * The patient had been on ASA and Plavix. He is now also on Coumadin for A flutter. (INR = 1.84 today). Dr. Myra Gianotti stented a long segment of the right CIA and EIA. In addition, his arteries are small. I think that he at least needs to remain on ASA  from a vascular standpoint. I will discuss Plavix with Dr. Myra Gianotti. If he is cardioverted and does not require Coumadin, then we would definitely want to continue ASA and Plavix. I agree with Dr Myrtis Ser that we need to weigh the risk of bleeding (on Coumadin, ASA, Plavix) vs the risk of occlusion of his iliac stents without Plavix.   * [facility] has him on our service. I believe that he is on the Cardiologists service.   Cari Caraway Beeper: [phone] 03/25/2013

## 2013-03-27 ENCOUNTER — Telehealth: Payer: Self-pay | Admitting: Vascular Surgery

## 2013-03-27 NOTE — Telephone Encounter (Addendum)
Message copied by Fredrich Birks on Mon Mar 27, 2013 12:25 PM ------      Message from: Melene Plan      Created: Mon Mar 27, 2013  9:50 AM      Regarding: FW: f/u                   ----- Message -----         From: Chuck Hint, MD         Sent: 03/26/2013  10:40 AM           To: Melene Plan, RN, Conley Simmonds Pullins, RN      Subject: f/u                                                      He will need a follow up with me in 2-3 weeks to follow his right lateral ankle wound. Thank you. CD ------  03/27/13: spoke with pt to schedule, dpm

## 2013-04-18 ENCOUNTER — Encounter: Payer: Self-pay | Admitting: Vascular Surgery

## 2013-04-19 ENCOUNTER — Ambulatory Visit (INDEPENDENT_AMBULATORY_CARE_PROVIDER_SITE_OTHER): Payer: Medicare Other | Admitting: Vascular Surgery

## 2013-04-19 ENCOUNTER — Encounter: Payer: Self-pay | Admitting: Vascular Surgery

## 2013-04-19 VITALS — BP 146/64 | HR 51 | Ht 69.0 in | Wt 196.8 lb

## 2013-04-19 DIAGNOSIS — I739 Peripheral vascular disease, unspecified: Secondary | ICD-10-CM

## 2013-04-19 NOTE — Progress Notes (Signed)
   Patient name: [name] [name] MRN: [id number] DOB: 30-Nov-1950 Sex: male  REASON FOR VISIT: follow up of right malleolar wound.  HPI: [name] [name] is a 63 y.o. male who I had seen on 03/08/2013 with a wound on his right lateral malleolus there was approximately 1 cm in diameter. I set him up for an arteriogram. However his arteriogram was canceled because he had a rapid heart rate and was admitted by cardiology. During his workup he developed an acutely ischemic right lower extremity. He was taken to the operating room by Dr. Myra Gianotti and underwent a right common femoral and superficial femoral artery endarterectomy with patch angioplasty and stenting of the right common iliac artery and right external iliac artery with an excellent result. He was discharged from the hospital on 03/25/2013.  Of note I reviewed his arteriogram which did show some stenosis in the superficial femoral artery on the right. This was an approximately 50-60% stenosis in the adductor canal. All tibial vessels were diffusely diseased in the anterior tibial and posterior tibial arteries didn't cross the ankle to form the plantar arch.  He has no specific complaints except that he has had some separation of his right groin incision.  REVIEW OF SYSTEMS: Arly.Keller ] denotes positive finding; [  ] denotes negative finding  CARDIOVASCULAR:  [ ]  chest pain   [ ]  dyspnea on exertion    CONSTITUTIONAL:  [ ]  fever   [ ]  chills  PHYSICAL EXAM: Filed Vitals:   04/19/13 1030  BP: 146/64  Pulse: 51  Height: 5\' 9"  (1.753 m)  Weight: 196 lb 12.8 oz (89.268 kg)  SpO2: 100%   Body mass index is 29.05 kg/(m^2). GENERAL: The patient is a well-nourished male, in no acute distress. The vital signs are documented above. CARDIOVASCULAR: There is a regular rate and rhythm. PULMONARY: There is good air exchange bilaterally without wheezing or rales. He has some separation of the right groin which I packed today. There is no cellulitis  or drainage. He has a brisk posterior tibial and anterior tibial signal with the Doppler. The 2 wounds on his right lateral malleolus measures approximate a centimeter in diameter.  MEDICAL ISSUES: I have instructed his wife on how to do the dressing changes to his right groin. She will continue the dressing changes on the ankle. I will see him back in 3 weeks with ABIs and toe pressure on the right. Both of these wounds on the ankle gradually heal. If not we we need to consider an endovascular approach to his moderate superficial femoral artery stenosis. Fortunately he is not a smoker.  DICKSON,CHRISTOPHER S [facility] Beeper: [phone]

## 2013-04-19 NOTE — Addendum Note (Signed)
Addended by: Sharee Pimple on: 04/19/2013 05:35 PM   Modules accepted: Orders

## 2013-04-25 ENCOUNTER — Encounter (HOSPITAL_COMMUNITY): Payer: Self-pay | Admitting: Emergency Medicine

## 2013-04-25 ENCOUNTER — Emergency Department (HOSPITAL_COMMUNITY): Payer: Medicare Other

## 2013-04-25 ENCOUNTER — Inpatient Hospital Stay (HOSPITAL_COMMUNITY)
Admission: EM | Admit: 2013-04-25 | Discharge: 2013-05-05 | DRG: 856 | Disposition: A | Discharge status: Home Health | Payer: Medicare Other | Attending: Internal Medicine | Admitting: Internal Medicine

## 2013-04-25 ENCOUNTER — Inpatient Hospital Stay: Admit: 2013-04-25 | Payer: Self-pay | Admitting: Vascular Surgery

## 2013-04-25 DIAGNOSIS — E876 Hypokalemia: Secondary | ICD-10-CM | POA: Diagnosis present

## 2013-04-25 DIAGNOSIS — E872 Acidosis, unspecified: Secondary | ICD-10-CM | POA: Diagnosis present

## 2013-04-25 DIAGNOSIS — E785 Hyperlipidemia, unspecified: Secondary | ICD-10-CM

## 2013-04-25 DIAGNOSIS — IMO0002 Reserved for concepts with insufficient information to code with codable children: Secondary | ICD-10-CM

## 2013-04-25 DIAGNOSIS — Z889 Allergy status to unspecified drugs, medicaments and biological substances status: Secondary | ICD-10-CM

## 2013-04-25 DIAGNOSIS — J9600 Acute respiratory failure, unspecified whether with hypoxia or hypercapnia: Secondary | ICD-10-CM

## 2013-04-25 DIAGNOSIS — Z7901 Long term (current) use of anticoagulants: Secondary | ICD-10-CM

## 2013-04-25 DIAGNOSIS — I10 Essential (primary) hypertension: Secondary | ICD-10-CM

## 2013-04-25 DIAGNOSIS — Z6829 Body mass index (BMI) 29.0-29.9, adult: Secondary | ICD-10-CM

## 2013-04-25 DIAGNOSIS — D696 Thrombocytopenia, unspecified: Secondary | ICD-10-CM | POA: Diagnosis present

## 2013-04-25 DIAGNOSIS — N189 Chronic kidney disease, unspecified: Secondary | ICD-10-CM | POA: Diagnosis present

## 2013-04-25 DIAGNOSIS — I4892 Unspecified atrial flutter: Secondary | ICD-10-CM

## 2013-04-25 DIAGNOSIS — I129 Hypertensive chronic kidney disease with stage 1 through stage 4 chronic kidney disease, or unspecified chronic kidney disease: Secondary | ICD-10-CM | POA: Diagnosis present

## 2013-04-25 DIAGNOSIS — Z7902 Long term (current) use of antithrombotics/antiplatelets: Secondary | ICD-10-CM

## 2013-04-25 DIAGNOSIS — I959 Hypotension, unspecified: Secondary | ICD-10-CM

## 2013-04-25 DIAGNOSIS — I252 Old myocardial infarction: Secondary | ICD-10-CM

## 2013-04-25 DIAGNOSIS — R6521 Severe sepsis with septic shock: Secondary | ICD-10-CM | POA: Diagnosis present

## 2013-04-25 DIAGNOSIS — I509 Heart failure, unspecified: Secondary | ICD-10-CM | POA: Diagnosis present

## 2013-04-25 DIAGNOSIS — I798 Other disorders of arteries, arterioles and capillaries in diseases classified elsewhere: Secondary | ICD-10-CM | POA: Diagnosis present

## 2013-04-25 DIAGNOSIS — Z87891 Personal history of nicotine dependence: Secondary | ICD-10-CM

## 2013-04-25 DIAGNOSIS — I5022 Chronic systolic (congestive) heart failure: Secondary | ICD-10-CM

## 2013-04-25 DIAGNOSIS — Y832 Surgical operation with anastomosis, bypass or graft as the cause of abnormal reaction of the patient, or of later complication, without mention of misadventure at the time of the procedure: Secondary | ICD-10-CM | POA: Diagnosis present

## 2013-04-25 DIAGNOSIS — Z95810 Presence of automatic (implantable) cardiac defibrillator: Secondary | ICD-10-CM

## 2013-04-25 DIAGNOSIS — I255 Ischemic cardiomyopathy: Secondary | ICD-10-CM

## 2013-04-25 DIAGNOSIS — Z833 Family history of diabetes mellitus: Secondary | ICD-10-CM

## 2013-04-25 DIAGNOSIS — Z66 Do not resuscitate: Secondary | ICD-10-CM | POA: Diagnosis not present

## 2013-04-25 DIAGNOSIS — Z79899 Other long term (current) drug therapy: Secondary | ICD-10-CM

## 2013-04-25 DIAGNOSIS — E1151 Type 2 diabetes mellitus with diabetic peripheral angiopathy without gangrene: Secondary | ICD-10-CM

## 2013-04-25 DIAGNOSIS — I4510 Unspecified right bundle-branch block: Secondary | ICD-10-CM | POA: Diagnosis present

## 2013-04-25 DIAGNOSIS — R001 Bradycardia, unspecified: Secondary | ICD-10-CM

## 2013-04-25 DIAGNOSIS — M069 Rheumatoid arthritis, unspecified: Secondary | ICD-10-CM

## 2013-04-25 DIAGNOSIS — T8140XA Infection following a procedure, unspecified, initial encounter: Principal | ICD-10-CM | POA: Diagnosis present

## 2013-04-25 DIAGNOSIS — I4891 Unspecified atrial fibrillation: Secondary | ICD-10-CM

## 2013-04-25 DIAGNOSIS — E1159 Type 2 diabetes mellitus with other circulatory complications: Secondary | ICD-10-CM | POA: Diagnosis present

## 2013-04-25 DIAGNOSIS — R579 Shock, unspecified: Secondary | ICD-10-CM

## 2013-04-25 DIAGNOSIS — Z8249 Family history of ischemic heart disease and other diseases of the circulatory system: Secondary | ICD-10-CM

## 2013-04-25 DIAGNOSIS — Z7982 Long term (current) use of aspirin: Secondary | ICD-10-CM

## 2013-04-25 DIAGNOSIS — R6520 Severe sepsis without septic shock: Secondary | ICD-10-CM

## 2013-04-25 DIAGNOSIS — A4901 Methicillin susceptible Staphylococcus aureus infection, unspecified site: Secondary | ICD-10-CM | POA: Diagnosis present

## 2013-04-25 DIAGNOSIS — R5381 Other malaise: Secondary | ICD-10-CM

## 2013-04-25 DIAGNOSIS — I2510 Atherosclerotic heart disease of native coronary artery without angina pectoris: Secondary | ICD-10-CM

## 2013-04-25 DIAGNOSIS — R110 Nausea: Secondary | ICD-10-CM

## 2013-04-25 DIAGNOSIS — A419 Sepsis, unspecified organism: Secondary | ICD-10-CM

## 2013-04-25 DIAGNOSIS — I428 Other cardiomyopathies: Secondary | ICD-10-CM | POA: Diagnosis present

## 2013-04-25 DIAGNOSIS — Z951 Presence of aortocoronary bypass graft: Secondary | ICD-10-CM

## 2013-04-25 DIAGNOSIS — K219 Gastro-esophageal reflux disease without esophagitis: Secondary | ICD-10-CM

## 2013-04-25 DIAGNOSIS — I5023 Acute on chronic systolic (congestive) heart failure: Secondary | ICD-10-CM

## 2013-04-25 DIAGNOSIS — I739 Peripheral vascular disease, unspecified: Secondary | ICD-10-CM

## 2013-04-25 DIAGNOSIS — N170 Acute kidney failure with tubular necrosis: Secondary | ICD-10-CM | POA: Diagnosis present

## 2013-04-25 DIAGNOSIS — E871 Hypo-osmolality and hyponatremia: Secondary | ICD-10-CM | POA: Diagnosis present

## 2013-04-25 LAB — POCT I-STAT 3, ART BLOOD GAS (G3+)
ACID-BASE DEFICIT: 5 mmol/L — AB (ref 0.0–2.0)
BICARBONATE: 20.1 meq/L (ref 20.0–24.0)
O2 Saturation: 99 %
Patient temperature: 98.6
TCO2: 21 mmol/L (ref 0–100)
pCO2 arterial: 36.6 mmHg (ref 35.0–45.0)
pH, Arterial: 7.349 — ABNORMAL LOW (ref 7.350–7.450)
pO2, Arterial: 134 mmHg — ABNORMAL HIGH (ref 80.0–100.0)

## 2013-04-25 LAB — CG4 I-STAT (LACTIC ACID): LACTIC ACID, VENOUS: 4.57 mmol/L — AB (ref 0.5–2.2)

## 2013-04-25 LAB — PRO B NATRIURETIC PEPTIDE: Pro B Natriuretic peptide (BNP): 8808 pg/mL — ABNORMAL HIGH (ref 0–125)

## 2013-04-25 MED ORDER — ASPIRIN 81 MG PO CHEW: 324.0000 mg | CHEWABLE_TABLET | ORAL | Status: AC

## 2013-04-25 MED ORDER — SODIUM CHLORIDE 0.9 % IV BOLUS (SEPSIS)
1000.0000 mL | Freq: Once | INTRAVENOUS | Status: AC
  Administered 2013-04-25: 1000 mL via INTRAVENOUS

## 2013-04-25 MED ORDER — NOREPINEPHRINE BITARTRATE 1 MG/ML IJ SOLN
2.0000 ug/min | INTRAVENOUS | Status: DC
  Administered 2013-04-25: 5 ug/min via INTRAVENOUS
  Filled 2013-04-25: qty 4

## 2013-04-25 MED ORDER — PANTOPRAZOLE SODIUM 40 MG IV SOLR
40.0000 mg | Freq: Every day | INTRAVENOUS | Status: DC
  Administered 2013-04-26 – 2013-04-29 (×5): 40 mg via INTRAVENOUS
  Filled 2013-04-25 (×6): qty 40

## 2013-04-25 MED ORDER — PIPERACILLIN-TAZOBACTAM 3.375 G IVPB 30 MIN: 3.3750 g | Freq: Once | INTRAVENOUS | Status: DC

## 2013-04-25 MED ORDER — HYDROCORTISONE SOD SUCCINATE 100 MG IJ SOLR
50.0000 mg | Freq: Four times a day (QID) | INTRAMUSCULAR | Status: DC
  Administered 2013-04-26 – 2013-04-27 (×7): 50 mg via INTRAVENOUS
  Filled 2013-04-25 (×7): qty 1
  Filled 2013-04-25: qty 2
  Filled 2013-04-25 (×2): qty 1

## 2013-04-25 MED ORDER — ASPIRIN 300 MG RE SUPP
300.0000 mg | RECTAL | Status: AC
  Administered 2013-04-26: 300 mg via RECTAL
  Filled 2013-04-25: qty 1

## 2013-04-25 MED ORDER — ACETAMINOPHEN 650 MG RE SUPP
650.0000 mg | Freq: Once | RECTAL | Status: AC
  Administered 2013-04-26: 650 mg via RECTAL
  Filled 2013-04-25: qty 1

## 2013-04-25 MED ORDER — SODIUM CHLORIDE 0.9 % IV SOLN
250.0000 mL | INTRAVENOUS | Status: DC | PRN
  Administered 2013-04-26 (×2): via INTRAVENOUS

## 2013-04-25 MED ORDER — SODIUM CHLORIDE 0.9 % IV SOLN
Freq: Once | INTRAVENOUS | Status: AC
  Administered 2013-04-25: 22:00:00 via INTRAVENOUS

## 2013-04-25 MED ORDER — SODIUM CHLORIDE 0.9 % IV SOLN
Freq: Once | INTRAVENOUS | Status: AC
  Administered 2013-04-25: 23:00:00 via INTRAVENOUS

## 2013-04-25 NOTE — ED Notes (Signed)
Dr Darrick Penna and Critical Care in at bedside.

## 2013-04-25 NOTE — ED Provider Notes (Signed)
CSN: [id number]     Arrival date & time 04/25/13  2124 History   First MD Initiated Contact with Patient 04/25/13 2141     Chief Complaint  Patient presents with  . Shortness of Breath  . Code Sepsis   (Consider location/radiation/quality/duration/timing/severity/associated sxs/prior Treatment) Patient is a 63 y.o. male presenting with general illness. The history is provided by the patient.  Illness Severity:  Severe Onset quality:  Sudden Duration:  2 days Timing:  Constant Progression:  Worsening Chronicity:  New Associated symptoms: abdominal pain, fatigue, fever, nausea, shortness of breath and vomiting   Associated symptoms: no chest pain, no congestion, no cough, no diarrhea, no headaches, no loss of consciousness, no myalgias and no rash     Past Medical History  Diagnosis Date  . Diabetes mellitus   . Hypertension   . Hyperlipidemia   . Rheumatoid arthritis   . CAD (coronary artery disease)   . Myocardial infarction 2000 & 2006  . GERD (gastroesophageal reflux disease)   . Leg pain   . CHF (congestive heart failure)   . Chronic kidney disease   . PAD (peripheral artery disease)   . Decubitus ulcer of right ankle   . 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 Jude - single chamber ICD  . Pacemaker    Past Surgical History  Procedure Laterality Date  . Hernia repair    . Coronary artery bypass graft    . Carpal tunnel release    . Knee surgery    . Cardiac defibrillator placement  04/08/2012    St. Jude Device - single chamber ICD  . Difibulator    . Patch angioplasty Right 03/14/2013    Procedure: PATCH ANGIOPLASTY Right Common Femoral, Superficial Femoral and Profunda-Femoral;  Surgeon: Nada Libman, MD;  Location: [facility] OR;  Service: Vascular;  Laterality: Right;  . Abdominal aortagram N/A 03/14/2013    Procedure: ABDOMINAL AORTAGRAM with right leg runoff.  Catheter in aorta times one;  Surgeon: Nada Libman, MD;   Location: [facility] OR;  Service: Vascular;  Laterality: N/A;  . Insertion of iliac stent Right 03/14/2013    Procedure: INSERTION OF ILIAC STENT Right Common and External ;  Surgeon: Nada Libman, MD;  Location: [facility] OR;  Service: Vascular;  Laterality: Right;   Family History  Problem Relation Age of Onset  . Hyperlipidemia Mother   . Diabetes Father   . Hypertension Father   . Heart disease Father   . Heart attack Father    History  Substance Use Topics  . Smoking status: Former Smoker    Types: Cigarettes    Quit date: 03/30/1997  . Smokeless tobacco: Not on file  . Alcohol Use: 0.6 oz/week    1 Glasses of wine per week    Review of Systems  Constitutional: Positive for fever and fatigue. Negative for chills.  HENT: Negative for congestion and facial swelling.   Eyes: Negative for discharge and visual disturbance.  Respiratory: Positive for shortness of breath. Negative for cough.   Cardiovascular: Negative for chest pain and palpitations.  Gastrointestinal: Positive for nausea, vomiting and abdominal pain. Negative for diarrhea.  Musculoskeletal: Negative for arthralgias and myalgias.  Skin: Negative for color change and rash.  Neurological: Negative for tremors, loss of consciousness, syncope and headaches.  Psychiatric/Behavioral: Negative for confusion and dysphoric mood.    Allergies  Lipitor; Lodine; Methotrexate derivatives; Ranitidine hcl; and Remicade  Home Medications   Current Outpatient Rx  Name  Route  Sig  Dispense  Refill  . Abatacept (ORENCIA [place])   Subcutaneous   Inject into the skin once a week.         Marland Kitchen aspirin EC 81 MG tablet   Oral   Take 81 mg by mouth daily.           . Calcium Carb-Cholecalciferol (CALCIUM 1000 + D PO)   Oral   Take 1 tablet by mouth daily.           . calcium carbonate (TUMS - DOSED IN MG ELEMENTAL CALCIUM) 500 MG chewable tablet   Oral   Chew 1 tablet by mouth as needed for indigestion.          . carvedilol  (COREG) 12.5 MG tablet   Oral   Take 25 mg by mouth 2 (two) times daily with a meal.         . clobetasol cream (TEMOVATE) 0.05 %   Topical   Apply 1 application topically 2 (two) times daily.         . clopidogrel (PLAVIX) 75 MG tablet   Oral   Take 75 mg by mouth daily.           . digoxin (LANOXIN) 0.125 MG tablet   Oral   Take 1 tablet (0.125 mg total) by mouth daily.   30 tablet   0   . ezetimibe (ZETIA) 10 MG tablet   Oral   Take 10 mg by mouth daily.           . feeding supplement, GLUCERNA SHAKE, (GLUCERNA SHAKE) LIQD   Oral   Take 237 mLs by mouth daily at 3 pm.   30 Can   0   . ferrous gluconate (FERGON) 325 MG tablet   Oral   Take 325 mg by mouth daily with breakfast.           . fish oil-omega-3 fatty acids 1000 MG capsule   Oral   Take 2 g by mouth daily.           . furosemide (LASIX) 40 MG tablet   Oral   Take 1 tablet (40 mg total) by mouth 2 (two) times daily.   30 tablet   0   . gabapentin (NEURONTIN) 600 MG tablet   Oral   Take 1-2 tablets by mouth 2 (two) times daily. 600 mg in am and 1200 mg at night.         . glyBURIDE-metformin (GLUCOVANCE) 5-500 MG per tablet   Oral   Take 1-2 tablets by mouth 2 (two) times daily with a meal. Takes 2 in the am and 1 tab in the pm.         . hydrALAZINE (APRESOLINE) 25 MG tablet   Oral   Take 1 tablet by mouth 2 (two) times daily.         Marland Kitchen HYDROcodone-acetaminophen (NORCO) 10-325 MG per tablet   Oral   Take 1 tablet by mouth 5 (five) times daily.         . isosorbide mononitrate (IMDUR) 60 MG 24 hr tablet   Oral   Take 30 mg by mouth daily.          Marland Kitchen losartan (COZAAR) 100 MG tablet   Oral   Take 1 tablet (100 mg total) by mouth daily.   30 tablet   0   . Magnesium 100 MG TABS   Oral   Take 1 tablet by mouth daily.         Marland Kitchen  nitroGLYCERIN (NITROSTAT) 0.4 MG SL tablet   Sublingual   Place 1 tablet (0.4 mg total) under the tongue every 5 (five) minutes x 3 doses  as needed for chest pain.   10 tablet   0   . pantoprazole (PROTONIX) 40 MG tablet   Oral   Take 80 mg by mouth 2 (two) times daily.          . potassium chloride SA (K-DUR,KLOR-CON) 20 MEQ tablet   Oral   Take 2 tablets (40 mEq total) by mouth daily.   30 tablet   0   . predniSONE (DELTASONE) 5 MG tablet   Oral   Take 5 mg by mouth 2 (two) times daily.          . sitaGLIPtin (JANUVIA) 100 MG tablet   Oral   Take 100 mg by mouth daily.           Marland Kitchen warfarin (COUMADIN) 2.5 MG tablet      Take 2 tablet by mouth daily. Need INR on 12-29 and adjust coumadin doses as needed.   60 tablet   0    BP 70/50  Pulse 107  Resp 24  SpO2 99% Physical Exam  Constitutional: He is oriented to person, place, and time. He appears well-developed and well-nourished.  HENT:  Head: Normocephalic and atraumatic.  Eyes: EOM are normal. Pupils are equal, round, and reactive to light.  Neck: Normal range of motion. Neck supple. No JVD present.  Cardiovascular: Normal rate and regular rhythm.  Exam reveals no gallop and no friction rub.   No murmur heard. Pulmonary/Chest: No respiratory distress. He has no wheezes.  Abdominal: He exhibits distension (chronic). There is no rebound and no guarding.  Musculoskeletal: Normal range of motion.  Neurological: He is alert and oriented to person, place, and time.  Skin: No rash noted. There is erythema. No pallor.  Open inguinal wound with purulent discharge  Psychiatric: He has a normal mood and affect. His behavior is normal.    ED Course  CENTRAL LINE Date/Time: 04/25/2013 11:09 PM Performed by: Melene Plan Authorized by: Melene Plan Consent: Verbal consent obtained. The procedure was performed in an emergent situation. Risks and benefits: risks, benefits and alternatives were discussed Consent given by: patient Required items: required blood products, implants, devices, and special equipment available Patient identity confirmed: verbally  with patient Time out: Immediately prior to procedure a "time out" was called to verify the correct patient, procedure, equipment, support staff and site/side marked as required. Indications: vascular access and central pressure monitoring Anesthesia: local infiltration Local anesthetic: lidocaine 1% with epinephrine Anesthetic total: 5 ml Patient sedated: no Preparation: skin prepped with 2% chlorhexidine Skin prep agent dried: skin prep agent completely dried prior to procedure Sterile barriers: all five maximum sterile barriers used - cap, mask, sterile gown, sterile gloves, and large sterile sheet Hand hygiene: hand hygiene performed prior to central venous catheter insertion Location details: left femoral Site selection rationale: patient altered, moving head rapidly, possible infectious source R groin Patient position: flat Catheter type: triple lumen Pre-procedure: landmarks identified Ultrasound guidance: yes Number of attempts: 1 Successful placement: yes Post-procedure: line sutured Assessment: blood return through all ports and free fluid flow Patient tolerance: Patient tolerated the procedure well with no immediate complications.   (including critical care time) Labs Review Labs Reviewed  PRO B NATRIURETIC PEPTIDE - Abnormal; Notable for the following:    Pro B Natriuretic peptide (BNP) 8808.0 (*)    All other  components within normal limits  POCT I-STAT 3, BLOOD GAS (G3+) - Abnormal; Notable for the following:    pH, Arterial 7.349 (*)    pO2, Arterial 134.0 (*)    Acid-base deficit 5.0 (*)    All other components within normal limits  CG4 I-STAT (LACTIC ACID) - Abnormal; Notable for the following:    Lactic Acid, Venous 4.57 (*)    All other components within normal limits  WOUND CULTURE  WOUND CULTURE  COMPREHENSIVE METABOLIC PANEL  CBC  PROTIME-INR  TROPONIN I  TROPONIN I  TROPONIN I  CORTISOL  TYPE AND SCREEN  PREPARE FRESH FROZEN PLASMA   Imaging  Review Dg Chest Portable 1 View  04/25/2013   CLINICAL DATA:  Shortness of breath, respiratory distress  EXAM: PORTABLE CHEST - 1 VIEW  COMPARISON:  The 07/17/2012  FINDINGS: Prior median sternotomy noted. Left subclavian pacer evident. Cardiomegaly without CHF or pneumonia. Lungs clear. Defibrillator pad overlies the right chest. No effusion or pneumothorax. Trachea midline.  IMPRESSION: Cardiomegaly without acute process   Electronically Signed   By: Ruel Favors M.D.   On: 04/25/2013 22:39    EKG Interpretation   None       MDM   1. Shock   2. Sepsis     63-year-old male with a history of a patch angioplasty with insertion of right iliac stent done approximately 10 days ago.  Patient with tearing of his stitches to his right groin that a couple days ago now with purulent discharge wife found him today obtunded brought him into [facility] ED where he was found to be tachycardic and hypertensive. To be probably septic started on vancomycin and Zosyn transferred here to be evaluated by vascular surgery.  Patient hypertensive on arrival 60/40, central line placed patient started on a norepi drip, and given 2 L of fluid wide open.  Patient with altered mental status on arrival in refusing intubation.   Vascular and critical care consulted. Patient limits the ICU.  Patient with initial improvement on norepinephrine patient subsequently became hypotensive again and EEG with change in mental status and changed his skin color. Patient with negative fast.  Per critical care will give solumedrol and start on vasopressin  EMERGENCY DEPARTMENT [place] FAST EXAM  INDICATIONS:Hypotension  PERFORMED BY: Myself  IMAGES ARCHIVED?: Yes  FINDINGS: All views negative  LIMITATIONS:  Body habitus and Emergent procedure  INTERPRETATION:  No abdominal free fluid and No pericardial effusion  COMMENT:  Done at bedside due to hypotension       Melene Plan, MD 04/26/13 [phone]

## 2013-04-25 NOTE — Consult Note (Addendum)
[facility] [place] HISTORY AND PHYSICAL   History of Present Illness:  Patient is a 63 y.o. year old male who presents for evaluation of poorly healing right groin wound and sepsis.  Pt with history of right femoral endarterectomy and right iliac stent by Dr Myra Gianotti approximately.  Other medical problems include afib, chf, diabetes, hypertension, coronary disease which have been relatively stable.  He was on coumadin plavix and aspirin for anticoagulation at discharge. Received 4 L fluid in [place] prior to transfer.  Was hypotensive on arrival now on Levophed. Had left femoral central line started here.  Past Medical History  Diagnosis Date  . Diabetes mellitus   . Hypertension   . Hyperlipidemia   . Rheumatoid arthritis   . CAD (coronary artery disease)   . Myocardial infarction 2000 & 2006  . GERD (gastroesophageal reflux disease)   . Leg pain   . CHF (congestive heart failure)   . Chronic kidney disease   . PAD (peripheral artery disease)   . Decubitus ulcer of right ankle   . 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 Jude - single chamber ICD  . Pacemaker     Past Surgical History  Procedure Laterality Date  . Hernia repair    . Coronary artery bypass graft    . Carpal tunnel release    . Knee surgery    . Cardiac defibrillator placement  04/08/2012    St. Jude Device - single chamber ICD  . Difibulator    . Patch angioplasty Right 03/14/2013    Procedure: PATCH ANGIOPLASTY Right Common Femoral, Superficial Femoral and Profunda-Femoral;  Surgeon: Nada Libman, MD;  Location: [facility] OR;  Service: Vascular;  Laterality: Right;  . Abdominal aortagram N/A 03/14/2013    Procedure: ABDOMINAL AORTAGRAM with right leg runoff.  Catheter in aorta times one;  Surgeon: Nada Libman, MD;  Location: [facility] OR;  Service: Vascular;  Laterality: N/A;  . Insertion of iliac stent Right 03/14/2013    Procedure: INSERTION OF ILIAC STENT  Right Common and External ;  Surgeon: Nada Libman, MD;  Location: [facility] OR;  Service: Vascular;  Laterality: Right;    Social History History  Substance Use Topics  . Smoking status: Former Smoker    Types: Cigarettes    Quit date: 03/30/1997  . Smokeless tobacco: Not on file  . Alcohol Use: 0.6 oz/week    1 Glasses of wine per week    Family History Family History  Problem Relation Age of Onset  . Hyperlipidemia Mother   . Diabetes Father   . Hypertension Father   . Heart disease Father   . Heart attack Father     Allergies  Allergies  Allergen Reactions  . Lipitor [Atorvastatin]     Weak muscle  . Lodine [Etodolac] Nausea And Vomiting  . Methotrexate Derivatives Nausea And Vomiting  . Ranitidine Hcl Nausea Only  . Remicade [Infliximab] Other (See Comments)    Chills and shakes      Current Facility-Administered Medications  Medication Dose Route Frequency Provider Last Rate Last Dose  . norepinephrine (LEVOPHED) 4 mg in dextrose 5 % 250 mL infusion  2-50 mcg/min Intravenous Titrated Enid Skeens, MD 18.8 mL/hr at 04/25/13 2242 5 mcg/min at 04/25/13 2242   Current Outpatient Prescriptions  Medication Sig Dispense Refill  . Abatacept (ORENCIA [place]) Inject into the skin once a week.      Marland Kitchen aspirin EC 81 MG tablet Take 81  mg by mouth daily.        . Calcium Carb-Cholecalciferol (CALCIUM 1000 + D PO) Take 1 tablet by mouth daily.        . calcium carbonate (TUMS - DOSED IN MG ELEMENTAL CALCIUM) 500 MG chewable tablet Chew 1 tablet by mouth as needed for indigestion.       . carvedilol (COREG) 12.5 MG tablet Take 25 mg by mouth 2 (two) times daily with a meal.      . clobetasol cream (TEMOVATE) 0.05 % Apply 1 application topically 2 (two) times daily.      . clopidogrel (PLAVIX) 75 MG tablet Take 75 mg by mouth daily.        . digoxin (LANOXIN) 0.125 MG tablet Take 1 tablet (0.125 mg total) by mouth daily.  30 tablet  0  . ezetimibe (ZETIA) 10 MG tablet Take 10 mg by  mouth daily.        . feeding supplement, GLUCERNA SHAKE, (GLUCERNA SHAKE) LIQD Take 237 mLs by mouth daily at 3 pm.  30 Can  0  . ferrous gluconate (FERGON) 325 MG tablet Take 325 mg by mouth daily with breakfast.        . fish oil-omega-3 fatty acids 1000 MG capsule Take 2 g by mouth daily.        . furosemide (LASIX) 40 MG tablet Take 1 tablet (40 mg total) by mouth 2 (two) times daily.  30 tablet  0  . gabapentin (NEURONTIN) 600 MG tablet Take 1-2 tablets by mouth 2 (two) times daily. 600 mg in am and 1200 mg at night.      . glyBURIDE-metformin (GLUCOVANCE) 5-500 MG per tablet Take 1-2 tablets by mouth 2 (two) times daily with a meal. Takes 2 in the am and 1 tab in the pm.      . hydrALAZINE (APRESOLINE) 25 MG tablet Take 1 tablet by mouth 2 (two) times daily.      Marland Kitchen HYDROcodone-acetaminophen (NORCO) 10-325 MG per tablet Take 1 tablet by mouth 5 (five) times daily.      . isosorbide mononitrate (IMDUR) 60 MG 24 hr tablet Take 30 mg by mouth daily.       Marland Kitchen losartan (COZAAR) 100 MG tablet Take 1 tablet (100 mg total) by mouth daily.  30 tablet  0  . Magnesium 100 MG TABS Take 1 tablet by mouth daily.      . nitroGLYCERIN (NITROSTAT) 0.4 MG SL tablet Place 1 tablet (0.4 mg total) under the tongue every 5 (five) minutes x 3 doses as needed for chest pain.  10 tablet  0  . pantoprazole (PROTONIX) 40 MG tablet Take 80 mg by mouth 2 (two) times daily.       . potassium chloride SA (K-DUR,KLOR-CON) 20 MEQ tablet Take 2 tablets (40 mEq total) by mouth daily.  30 tablet  0  . predniSONE (DELTASONE) 5 MG tablet Take 5 mg by mouth 2 (two) times daily.       . sitaGLIPtin (JANUVIA) 100 MG tablet Take 100 mg by mouth daily.        Marland Kitchen warfarin (COUMADIN) 2.5 MG tablet Take 2 tablet by mouth daily. Need INR on 12-29 and adjust coumadin doses as needed.  60 tablet  0    ROS:   Unable to obtain secondary to altered mental status  Physical Examination  Filed Vitals:   04/25/13 2212 04/25/13 2230 04/25/13  2235 04/25/13 2240  BP: 117/99 92/69 94/55  85/62  Pulse:  103 109 106   Resp:  22 21 22   SpO2: 100% 92% 100%     There is no weight on file to calculate BMI.  General:  Disoriented mildly agitated answers some yes no questions HEENT: Normal Neck: No JVD Pulmonary: Clear to auscultation bilaterally, on BiPAP which was started in [place] Cardiac: Regular Rate and Rhythm  Abdomen: Soft, mildly tender, non-distended, no mass, obese Skin: No rash, open macerated wound right groin no pus expressible no skin erythema, 2 cm malleolus ulcer Extremity Pulses:  absent radial, brachial, femoral, absent dorsalis pedis, posterior tibial pulses bilaterally Musculoskeletal: No deformity trace edema lower extremities  Neurologic: Upper and lower extremity motor 5/5 and symmetric but does not follow commands  DATA:   CBC    Component Value Date/Time   WBC 6.4 03/23/2013 0500   RBC 3.82* 03/23/2013 0500   HGB 10.0* 03/23/2013 0500   HCT 32.7* 03/23/2013 0500   PLT 152 03/23/2013 0500   MCV 85.6 03/23/2013 0500   MCH 26.2 03/23/2013 0500   MCHC 30.6 03/23/2013 0500   RDW 17.5* 03/23/2013 0500   LYMPHSABS 1.0 03/20/2013 0300   MONOABS 1.1* 03/20/2013 0300   EOSABS 0.1 03/20/2013 0300   BASOSABS 0.0 03/20/2013 0300     BMET    Component Value Date/Time   NA 140 03/24/2013 0500   K 3.4* 03/24/2013 0500   CL 101 03/24/2013 0500   CO2 30 03/24/2013 0500   GLUCOSE 108* 03/24/2013 0500   BUN 13 03/24/2013 0500   CREATININE 0.70 03/24/2013 0500   CALCIUM 8.6 03/24/2013 0500   GFRNONAA >90 03/24/2013 0500   GFRAA >90 03/24/2013 0500    ABG base deficit of 03/26/2013, nl pH bicarb CO2, bNP 8808, lactate 4.5  inr 3.1, cr 1.5, trop 0.17, alb 2.7   ASSESSMENT:  Sepsis, possible groin source, but need to rule out other sources.  Will involve Critical Care service for resuscitation and diagnostic workup.  Abdominal pain ? Referred versus other process. Risk of cardiac embolic  event should be low given therapeutic INR   PLAN:  To ICU, will most likely need [id number] cath to help with tailoring inotropic/pressor support.   Follow up wound culture from here.  Will review CT from [facility], Differential diagnosis also includes primary vs secondary MI.  Will cycle cardiac enzymes.  Need to reverse coumadin in the event he requires operation.  Can use heparin after this is reversed to reduce risk of afib embolic event.  Will discuss plan with family.  [facility], MD [facility] [place] Office: [phone] Pager: [phone]    Addendum: CT from [facility] reviewed.  Aorta iliac mesenteric renals all patent.  Severe vessel calcification but has flow to mesentery.  No evidence of dissection with cuts from desc thoracic aorta, fluid around right common femoral and right groin abscess vs hematoma.  Family updated.  They still do not want endotracheal tube at this point.  I explained to them that if he needs CPR this would be useless without and airway.  The stated " if CPR won't work without the tube then don't do it".  I also explained to them that if he becomes stable enough for an operation he would need an endotracheal tube intraop and post op.  They will discuss this as a family tonight.  Will notify [place] and [facility] of pt admission in am.  Please keep NPO.

## 2013-04-25 NOTE — ED Notes (Signed)
Patient from [facility] ED, possible Code Sepsis.  Patient is on BiPAP.  Patient was given multiple antibiotics, 4L of NS in the ED.  Patient is here, accepted by Dr Jettie Booze.  Patient is not wanting intubation, but does want CPR if needed. Patient is hypotensive on arrival.

## 2013-04-25 NOTE — H&P (Signed)
Name: [name] [name] MRN: [id number] DOB: May 01, 1950    ADMISSION DATE:  04/25/2013 CONSULTATION DATE:  04/25/13  REFERRING MD :  ED PRIMARY SERVICE: PCCM  CHIEF COMPLAINT:  Shaking chills, weakness   BRIEF PATIENT DESCRIPTION: [name] is a 63 y.o. male w/ PMHx of CAD s/p CABG, CHF w/ AICD/PPM (04/06/12), A-fib (on Coumadin) PAD s/p R. Femoral endarterectomy and R/ Iliac stent placement,  CKD, HTN, HLD, DM type II, transferred from [facility] ED w/ likely septic shock 2/2 bacteremia and acute respiratory failure requiring BiPAP.   SIGNIFICANT EVENTS / STUDIES:  1/27 - Transferred from [facility] ED w/ hypotension 1/27 - L. Femoral line placed, started on Levophed  LINES / TUBES: L. Femoral CVL 1/27 >>>  CULTURES: Blood 1/27 >>> Urine 1/27 >>> R. Femoral wound 1/27 >>>  ANTIBIOTICS: Vancomycin 1/27 >>> Cefepime 1/27 >>>  HISTORY OF PRESENT ILLNESS:  [name] is a 63 y.o. male w/ PMHx of CAD s/p CABG, CHF w/ AICD/PPM (04/06/12), A-fib (on Coumadin) PAD s/p R. Femoral endarterectomy and R/ Iliac stent placement,  CKD, HTN, HLD, DM type II, transferred from [facility] ED after having recent severe weakness, and shaking chills at home for the past few days. In ED, found to be desatting, placed on BiPAP. Hypotensive on arrival to [facility], R. Femoral line placed, Levophed started. Patient likely w/ septic shock 2/2 bacteremia.   PAST MEDICAL HISTORY :  Past Medical History  Diagnosis Date  . Diabetes mellitus   . Hypertension   . Hyperlipidemia   . Rheumatoid arthritis   . CAD (coronary artery disease)   . Myocardial infarction 2000 & 2006  . GERD (gastroesophageal reflux disease)   . Leg pain   . CHF (congestive heart failure)   . Chronic kidney disease   . PAD (peripheral artery disease)   . Decubitus ulcer of right ankle   . 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 Jude - single chamber ICD  . Pacemaker     Past Surgical History  Procedure Laterality Date  . Hernia repair    . Coronary artery bypass graft    . Carpal tunnel release    . Knee surgery    . Cardiac defibrillator placement  04/08/2012    St. Jude Device - single chamber ICD  . Difibulator    . Patch angioplasty Right 03/14/2013    Procedure: PATCH ANGIOPLASTY Right Common Femoral, Superficial Femoral and Profunda-Femoral;  Surgeon: Nada Libman, MD;  Location: [facility] OR;  Service: Vascular;  Laterality: Right;  . Abdominal aortagram N/A 03/14/2013    Procedure: ABDOMINAL AORTAGRAM with right leg runoff.  Catheter in aorta times one;  Surgeon: Nada Libman, MD;  Location: [facility] OR;  Service: Vascular;  Laterality: N/A;  . Insertion of iliac stent Right 03/14/2013    Procedure: INSERTION OF ILIAC STENT Right Common and External ;  Surgeon: Nada Libman, MD;  Location: [facility] OR;  Service: Vascular;  Laterality: Right;   Prior to Admission medications   Medication Sig Start Date End Date Taking? Authorizing Provider  Abatacept (ORENCIA [place]) Inject into the skin once a week.    Historical Provider, MD  aspirin EC 81 MG tablet Take 81 mg by mouth daily.      Historical Provider, MD  Calcium Carb-Cholecalciferol (CALCIUM 1000 + D PO) Take 1 tablet by mouth daily.      Historical Provider, MD  calcium carbonate (TUMS - DOSED IN  MG ELEMENTAL CALCIUM) 500 MG chewable tablet Chew 1 tablet by mouth as needed for indigestion.     Historical Provider, MD  carvedilol (COREG) 12.5 MG tablet Take 25 mg by mouth 2 (two) times daily with a meal. 03/25/13   Belkys A Regalado, MD  clobetasol cream (TEMOVATE) 0.05 % Apply 1 application topically 2 (two) times daily.    Historical Provider, MD  clopidogrel (PLAVIX) 75 MG tablet Take 75 mg by mouth daily.      Historical Provider, MD  digoxin (LANOXIN) 0.125 MG tablet Take 1 tablet (0.125 mg total) by mouth daily. 03/26/13   Belkys A Regalado, MD  ezetimibe (ZETIA) 10 MG tablet Take 10 mg by mouth  daily.      Historical Provider, MD  feeding supplement, GLUCERNA SHAKE, (GLUCERNA SHAKE) LIQD Take 237 mLs by mouth daily at 3 pm. 03/25/13   Belkys A Regalado, MD  ferrous gluconate (FERGON) 325 MG tablet Take 325 mg by mouth daily with breakfast.      Historical Provider, MD  fish oil-omega-3 fatty acids 1000 MG capsule Take 2 g by mouth daily.      Historical Provider, MD  furosemide (LASIX) 40 MG tablet Take 1 tablet (40 mg total) by mouth 2 (two) times daily. 03/25/13   Belkys A Regalado, MD  gabapentin (NEURONTIN) 600 MG tablet Take 1-2 tablets by mouth 2 (two) times daily. 600 mg in am and 1200 mg at night. 08/24/12   Historical Provider, MD  glyBURIDE-metformin (GLUCOVANCE) 5-500 MG per tablet Take 1-2 tablets by mouth 2 (two) times daily with a meal. Takes 2 in the am and 1 tab in the pm.    Historical Provider, MD  hydrALAZINE (APRESOLINE) 25 MG tablet Take 1 tablet by mouth 2 (two) times daily. 08/31/12   Historical Provider, MD  HYDROcodone-acetaminophen (NORCO) 10-325 MG per tablet Take 1 tablet by mouth 5 (five) times daily.    Historical Provider, MD  isosorbide mononitrate (IMDUR) 60 MG 24 hr tablet Take 30 mg by mouth daily.  08/31/12   Historical Provider, MD  losartan (COZAAR) 100 MG tablet Take 1 tablet (100 mg total) by mouth daily. 03/26/13   Belkys A Regalado, MD  Magnesium 100 MG TABS Take 1 tablet by mouth daily.    Historical Provider, MD  nitroGLYCERIN (NITROSTAT) 0.4 MG SL tablet Place 1 tablet (0.4 mg total) under the tongue every 5 (five) minutes x 3 doses as needed for chest pain. 03/25/13   Belkys A Regalado, MD  pantoprazole (PROTONIX) 40 MG tablet Take 80 mg by mouth 2 (two) times daily.     Historical Provider, MD  potassium chloride SA (K-DUR,KLOR-CON) 20 MEQ tablet Take 2 tablets (40 mEq total) by mouth daily. 03/25/13   Belkys A Regalado, MD  predniSONE (DELTASONE) 5 MG tablet Take 5 mg by mouth 2 (two) times daily.  08/28/11   Historical Provider, MD  sitaGLIPtin  (JANUVIA) 100 MG tablet Take 100 mg by mouth daily.      Historical Provider, MD  warfarin (COUMADIN) 2.5 MG tablet Take 2 tablet by mouth daily. Need INR on 12-29 and adjust coumadin doses as needed. 03/25/13   Alba Cory, MD   Allergies  Allergen Reactions  . Lipitor [Atorvastatin]     Weak muscle  . Lodine [Etodolac] Nausea And Vomiting  . Methotrexate Derivatives Nausea And Vomiting  . Ranitidine Hcl Nausea Only  . Remicade [Infliximab] Other (See Comments)    Chills and shakes  FAMILY HISTORY:  Family History  Problem Relation Age of Onset  . Hyperlipidemia Mother   . Diabetes Father   . Hypertension Father   . Heart disease Father   . Heart attack Father    SOCIAL HISTORY:  reports that he quit smoking about 16 years ago. His smoking use included Cigarettes. He smoked 0.00 packs per day. He does not have any smokeless tobacco history on file. He reports that he drinks about 0.6 ounces of alcohol per week. He reports that he does not use illicit drugs.  REVIEW OF SYSTEMS:  Unable to obtain at this time.   SUBJECTIVE:   VITAL SIGNS: Pulse Rate:  [47-146] 107 (01/28 0005) Resp:  [17-35] 35 (01/28 0040) BP: (59-127)/(18-99) 89/57 mmHg (01/28 0040) SpO2:  [84 %-100 %] 100 % (01/28 0040) FiO2 (%):  [50 %-60 %] 60 % (01/27 2258)  HEMODYNAMICS:   VENTILATOR SETTINGS: Vent Mode:  [-] BIPAP FiO2 (%):  [50 %-60 %] 60 % Set Rate:  [16 bmp] 16 bmp PEEP:  [5 cmH20] 5 cmH20 Pressure Support:  [10 cmH20] 10 cmH20 INTAKE / OUTPUT: Intake/Output     01/27 0701 - 01/28 0700   I.V. 2000   Total Intake 2000   Net +2000         PHYSICAL EXAMINATION: General:  Awake, lethargic, agitated. On BiPAP Neuro:  Lethargic, Moves all limbs spontaneously. CN's generally intact.  HEENT:  PERRL. Neck supple, trachea midline Cardiovascular:  Tachycardic, regular rhythm. No murmurs, gallops, rubs. Lungs:  Clear to ausculation bilaterally. No wheezes or crackles.  Abdomen:   Soft, non-tender, non-distended. BS +. R. Femoral wound w/ dehiscence.  Musculoskeletal:  Weak distal pulses in all extremities. No LE edema.  Skin:  Multiple small ulcers present on LE's. No erythema, or rash. R. Femoral wound as described above.   LABS:  CBC No results found for this basename: WBC, HGB, HCT, PLT,  in the last 168 hours Coag's No results found for this basename: APTT, INR,  in the last 168 hours BMET No results found for this basename: NA, K, CL, CO2, BUN, CREATININE, GLUCOSE,  in the last 168 hours Electrolytes No results found for this basename: CALCIUM, MG, PHOS,  in the last 168 hours Sepsis Markers  Recent Labs Lab 04/25/13 2210  LATICACIDVEN 4.57*   ABG  Recent Labs Lab 04/25/13 2151  PHART 7.349*  PCO2ART 36.6  PO2ART 134.0*   Liver Enzymes No results found for this basename: AST, ALT, ALKPHOS, BILITOT, ALBUMIN,  in the last 168 hours  Cardiac Enzymes  Recent Labs Lab 04/25/13 2205  PROBNP 8808.0*   Glucose No results found for this basename: GLUCAP,  in the last 168 hours  Imaging Dg Chest Portable 1 View  04/25/2013   CLINICAL DATA:  Shortness of breath, respiratory distress  EXAM: PORTABLE CHEST - 1 VIEW  COMPARISON:  The 07/17/2012  FINDINGS: Prior median sternotomy noted. Left subclavian pacer evident. Cardiomegaly without CHF or pneumonia. Lungs clear. Defibrillator pad overlies the right chest. No effusion or pneumothorax. Trachea midline.  IMPRESSION: Cardiomegaly without acute process   Electronically Signed   By: Ruel Favors M.D.   On: 04/25/2013 22:39   CXR: NACPD  ASSESSMENT / PLAN:  PULMONARY A: Acute respiratory failure, on BiPAP DNI P:   Continue BiPAP prn Wean off to [place] as tolerated.  Goal SpO2 >88% Discussed DNI wishes w/ patient's family and still wish to not intubate in the event of failing BiPAP.  CARDIOVASCULAR A:  Hypotensive, likely 2/2 septic shock. S/p ~4L NS Recent R. Femoral endarterectomy w/ iliac  stent. Poor wound healing. H/o CAD s/p CABG H/o A-fib, on Coumadin.  P:  Continue Levophed + Vasopressin. Wean as tolerated. Solu-Cortef 50 mg IV q6h (on prednisone at home). Seen by vascular surgery. Will give 4 units FFP in event that patient requires surgery.  Continue NS @ 125 ml/hr EKG in AM Troponin x 3 ECHO Hold Coreg, Lasix, Hydralazine, Losartan, Imdur, ASA, digoxin  RENAL A:   H/o CKD? P:   CMET in AM NS @ 125 ml/hr  GASTROINTESTINAL A:   H/o GERD GI PPx Nutrition P:   NPO while on BiPAP Protonix  HEMATOLOGIC A:   A-fib on Coumadin No Leukocytosis DVT/PE PPx P:  4 units FFP as described above.  Trend PT/INR CBC in AM SCD's for now  INFECTIOUS A:   Suspected septic shock 2/2 possible bacteremia s/p endarterectomy P:   Abx; Vancomycin + Cefepime Trend CBC R. Femoral wound drained by vascular surgery in ED. Will continue to monitor.  Trend Lactic acid  ENDOCRINE A:   H/o DM type II On chronic steroids at home P:   Solu-Cortef as above Cortisol level pending CBG's q4h ISS Hold Glucovance  NEUROLOGIC A:   Lethargic, agitated. P:   Continue to monitor Wrist restraints for now   TODAY'S SUMMARY: 63 y/o M transferred from [facility] ED w/ suspected septic shock 2/2 bacteremia. Placed on BiPAP, L. Femoral central line placed, started on Levophed and Vasopressin. S/p ~4L NS. Of note, patient s/p R. Femoral endarterectomy w/ iliac stent and poorly healing wound, drained in ED by vascular surgery.   Patient and family request that he is a DO NOT INTUBATE at this time and fully understand the risks.  Signed: Lars Masson, MD 04/26/2013 1:22 AM  I have personally obtained a history, examined the patient, evaluated laboratory and imaging results, formulated the assessment and plan and placed orders. CRITICAL CARE: The patient is critically ill with multiple organ systems failure and requires high complexity decision making for assessment and  support, frequent evaluation and titration of therapies, application of advanced monitoring technologies and extensive interpretation of multiple databases. Critical Care Time devoted to patient care services described in this note is 35 minutes.  [name], MD ; [facility] [phone].  After 5:30 PM or weekends, call [phone]

## 2013-04-26 ENCOUNTER — Encounter (HOSPITAL_COMMUNITY)
Admission: EM | Disposition: A | Discharge status: Home Health | Payer: Self-pay | Source: Home / Self Care | Attending: Internal Medicine

## 2013-04-26 ENCOUNTER — Encounter (HOSPITAL_COMMUNITY): Payer: Medicare Other | Admitting: Certified Registered Nurse Anesthetist

## 2013-04-26 ENCOUNTER — Inpatient Hospital Stay (HOSPITAL_COMMUNITY): Payer: Medicare Other

## 2013-04-26 ENCOUNTER — Inpatient Hospital Stay (HOSPITAL_COMMUNITY): Payer: Medicare Other | Admitting: Certified Registered Nurse Anesthetist

## 2013-04-26 ENCOUNTER — Encounter (HOSPITAL_COMMUNITY): Payer: Self-pay | Admitting: Certified Registered Nurse Anesthetist

## 2013-04-26 DIAGNOSIS — I517 Cardiomegaly: Secondary | ICD-10-CM

## 2013-04-26 DIAGNOSIS — A419 Sepsis, unspecified organism: Secondary | ICD-10-CM | POA: Diagnosis present

## 2013-04-26 DIAGNOSIS — I959 Hypotension, unspecified: Secondary | ICD-10-CM

## 2013-04-26 DIAGNOSIS — I2510 Atherosclerotic heart disease of native coronary artery without angina pectoris: Secondary | ICD-10-CM

## 2013-04-26 DIAGNOSIS — R6521 Severe sepsis with septic shock: Secondary | ICD-10-CM

## 2013-04-26 DIAGNOSIS — T82898A Other specified complication of vascular prosthetic devices, implants and grafts, initial encounter: Secondary | ICD-10-CM

## 2013-04-26 HISTORY — PX: PATCH ANGIOPLASTY: SHX6230

## 2013-04-26 HISTORY — PX: I & D EXTREMITY: SHX5045

## 2013-04-26 LAB — COMPREHENSIVE METABOLIC PANEL
ALBUMIN: 2.4 g/dL — AB (ref 3.5–5.2)
ALK PHOS: 86 U/L (ref 39–117)
ALT: 28 U/L (ref 0–53)
ALT: 38 U/L (ref 0–53)
AST: 59 U/L — AB (ref 0–37)
AST: 80 U/L — ABNORMAL HIGH (ref 0–37)
Albumin: 2.6 g/dL — ABNORMAL LOW (ref 3.5–5.2)
Alkaline Phosphatase: 72 U/L (ref 39–117)
BILIRUBIN TOTAL: 1.1 mg/dL (ref 0.3–1.2)
BILIRUBIN TOTAL: 1.7 mg/dL — AB (ref 0.3–1.2)
BUN: 22 mg/dL (ref 6–23)
BUN: 25 mg/dL — AB (ref 6–23)
CHLORIDE: 98 meq/L (ref 96–112)
CO2: 17 mEq/L — ABNORMAL LOW (ref 19–32)
CO2: 16 meq/L — AB (ref 19–32)
CREATININE: 1.74 mg/dL — AB (ref 0.50–1.35)
Calcium: 7.5 mg/dL — ABNORMAL LOW (ref 8.4–10.5)
Calcium: 7.8 mg/dL — ABNORMAL LOW (ref 8.4–10.5)
Chloride: 98 mEq/L (ref 96–112)
Creatinine, Ser: 1.51 mg/dL — ABNORMAL HIGH (ref 0.50–1.35)
GFR calc Af Amer: 47 mL/min — ABNORMAL LOW (ref >90)
GFR calc non Af Amer: 41 mL/min — ABNORMAL LOW (ref >90)
GFR, EST AFRICAN AMERICAN: 56 mL/min — AB (ref >90)
GFR, EST NON AFRICAN AMERICAN: 49 mL/min — AB (ref >90)
GLUCOSE: 429 mg/dL — AB (ref 70–99)
Glucose, Bld: 388 mg/dL — ABNORMAL HIGH (ref 70–99)
POTASSIUM: 5 meq/L (ref 3.7–5.3)
Potassium: 5 mEq/L (ref 3.7–5.3)
SODIUM: 134 meq/L — AB (ref 137–147)
SODIUM: 135 meq/L — AB (ref 137–147)
TOTAL PROTEIN: 6.1 g/dL (ref 6.0–8.3)
Total Protein: 5.5 g/dL — ABNORMAL LOW (ref 6.0–8.3)

## 2013-04-26 LAB — URINALYSIS, ROUTINE W REFLEX MICROSCOPIC
BILIRUBIN URINE: NEGATIVE
Glucose, UA: >1000 mg/dL — AB
Ketones, ur: 15 mg/dL — AB
Leukocytes, UA: NEGATIVE
NITRITE: NEGATIVE
PROTEIN: 30 mg/dL — AB
SPECIFIC GRAVITY, URINE: 1.039 — AB (ref 1.005–1.030)
UROBILINOGEN UA: 0.2 mg/dL (ref 0.0–1.0)
pH: 5.5 (ref 5.0–8.0)

## 2013-04-26 LAB — BASIC METABOLIC PANEL
BUN: 25 mg/dL — ABNORMAL HIGH (ref 6–23)
CHLORIDE: 103 meq/L (ref 96–112)
CO2: 18 mEq/L — ABNORMAL LOW (ref 19–32)
Calcium: 7.3 mg/dL — ABNORMAL LOW (ref 8.4–10.5)
Creatinine, Ser: 1.25 mg/dL (ref 0.50–1.35)
GFR calc Af Amer: 71 mL/min — ABNORMAL LOW (ref >90)
GFR, EST NON AFRICAN AMERICAN: 61 mL/min — AB (ref >90)
Glucose, Bld: 229 mg/dL — ABNORMAL HIGH (ref 70–99)
POTASSIUM: 4.3 meq/L (ref 3.7–5.3)
Sodium: 136 mEq/L — ABNORMAL LOW (ref 137–147)

## 2013-04-26 LAB — PROTIME-INR
INR: 1.58 — ABNORMAL HIGH (ref 0.00–1.49)
Prothrombin Time: 18.4 seconds — ABNORMAL HIGH (ref 11.6–15.2)

## 2013-04-26 LAB — POCT I-STAT 3, ART BLOOD GAS (G3+)
Acid-base deficit: 6 mmol/L — ABNORMAL HIGH (ref 0.0–2.0)
Bicarbonate: 19.9 mEq/L — ABNORMAL LOW (ref 20.0–24.0)
O2 Saturation: 100 %
PCO2 ART: 39.8 mmHg (ref 35.0–45.0)
Patient temperature: 97.8
TCO2: 21 mmol/L (ref 0–100)
pH, Arterial: 7.305 — ABNORMAL LOW (ref 7.350–7.450)
pO2, Arterial: 198 mmHg — ABNORMAL HIGH (ref 80.0–100.0)

## 2013-04-26 LAB — POCT I-STAT 7, (LYTES, BLD GAS, ICA,H+H)
ACID-BASE DEFICIT: 7 mmol/L — AB (ref 0.0–2.0)
BICARBONATE: 19.9 meq/L — AB (ref 20.0–24.0)
Calcium, Ion: 0.96 mmol/L — ABNORMAL LOW (ref 1.13–1.30)
HEMATOCRIT: 24 % — AB (ref 39.0–52.0)
HEMOGLOBIN: 8.2 g/dL — AB (ref 13.0–17.0)
O2 Saturation: 100 %
PH ART: 7.271 — AB (ref 7.350–7.450)
PO2 ART: 373 mmHg — AB (ref 80.0–100.0)
Patient temperature: 37
Potassium: 3.8 mEq/L (ref 3.7–5.3)
Sodium: 135 mEq/L — ABNORMAL LOW (ref 137–147)
TCO2: 21 mmol/L (ref 0–100)
pCO2 arterial: 43.4 mmHg (ref 35.0–45.0)

## 2013-04-26 LAB — PREPARE RBC (CROSSMATCH)

## 2013-04-26 LAB — CBC
HCT: 30.2 % — ABNORMAL LOW (ref 39.0–52.0)
Hemoglobin: 9.7 g/dL — ABNORMAL LOW (ref 13.0–17.0)
MCH: 26.6 pg (ref 26.0–34.0)
MCHC: 32.1 g/dL (ref 30.0–36.0)
MCV: 83 fL (ref 78.0–100.0)
PLATELETS: 173 10*3/uL (ref 150–400)
RBC: 3.64 MIL/uL — AB (ref 4.22–5.81)
RDW: 18.6 % — ABNORMAL HIGH (ref 11.5–15.5)
WBC: 15.7 10*3/uL — ABNORMAL HIGH (ref 4.0–10.5)

## 2013-04-26 LAB — TROPONIN I
TROPONIN I: 0.41 ng/mL — AB (ref <0.30)
Troponin I: 0.45 ng/mL (ref <0.30)
Troponin I: 0.53 ng/mL (ref <0.30)

## 2013-04-26 LAB — GLUCOSE, CAPILLARY
GLUCOSE-CAPILLARY: 290 mg/dL — AB (ref 70–99)
GLUCOSE-CAPILLARY: 348 mg/dL — AB (ref 70–99)
GLUCOSE-CAPILLARY: 227 mg/dL — AB (ref 70–99)
Glucose-Capillary: 386 mg/dL — ABNORMAL HIGH (ref 70–99)
Glucose-Capillary: 398 mg/dL — ABNORMAL HIGH (ref 70–99)
Glucose-Capillary: 388 mg/dL — ABNORMAL HIGH (ref 70–99)
Glucose-Capillary: 408 mg/dL — ABNORMAL HIGH (ref 70–99)
Glucose-Capillary: 360 mg/dL — ABNORMAL HIGH (ref 70–99)
Glucose-Capillary: 210 mg/dL — ABNORMAL HIGH (ref 70–99)

## 2013-04-26 LAB — URINE MICROSCOPIC-ADD ON

## 2013-04-26 LAB — POCT I-STAT 4, (NA,K, GLUC, HGB,HCT)
Glucose, Bld: 284 mg/dL — ABNORMAL HIGH (ref 70–99)
HCT: 27 % — ABNORMAL LOW (ref 39.0–52.0)
Hemoglobin: 9.2 g/dL — ABNORMAL LOW (ref 13.0–17.0)
POTASSIUM: 3.8 meq/L (ref 3.7–5.3)
SODIUM: 136 meq/L — AB (ref 137–147)

## 2013-04-26 LAB — LACTATE DEHYDROGENASE: LDH: 361 U/L — AB (ref 94–250)

## 2013-04-26 LAB — MRSA PCR SCREENING: MRSA by PCR: NEGATIVE

## 2013-04-26 LAB — CORTISOL: Cortisol, Plasma: 27.6 ug/dL

## 2013-04-26 LAB — MAGNESIUM: MAGNESIUM: 1.3 mg/dL — AB (ref 1.5–2.5)

## 2013-04-26 LAB — LACTIC ACID, PLASMA
LACTIC ACID, VENOUS: 3.2 mmol/L — AB (ref 0.5–2.2)
Lactic Acid, Venous: 3.6 mmol/L — ABNORMAL HIGH (ref 0.5–2.2)

## 2013-04-26 SURGERY — IRRIGATION AND DEBRIDEMENT EXTREMITY
Anesthesia: General | Site: Groin | Laterality: Right

## 2013-04-26 MED ORDER — FENTANYL CITRATE 0.05 MG/ML IJ SOLN
INTRAMUSCULAR | Status: AC
  Filled 2013-04-26: qty 5

## 2013-04-26 MED ORDER — FENTANYL CITRATE 0.05 MG/ML IJ SOLN
50.0000 ug | Freq: Once | INTRAMUSCULAR | Status: AC
  Filled 2013-04-26: qty 2

## 2013-04-26 MED ORDER — SODIUM CHLORIDE 0.9 % IR SOLN
Status: DC | PRN
  Administered 2013-04-26: 14:00:00

## 2013-04-26 MED ORDER — SODIUM CHLORIDE 0.9 % IV SOLN
0.0000 ug/h | INTRAVENOUS | Status: DC
  Administered 2013-04-26: 100 ug/h via INTRAVENOUS
  Filled 2013-04-26 (×2): qty 50

## 2013-04-26 MED ORDER — VASOPRESSIN 20 UNIT/ML IJ SOLN
0.0300 [IU]/min | INTRAMUSCULAR | Status: DC
  Administered 2013-04-26 – 2013-04-27 (×2): 0.03 [IU]/min via INTRAVENOUS
  Filled 2013-04-26 (×2): qty 2.5

## 2013-04-26 MED ORDER — DEXTROSE 5 % IV SOLN
1.0000 g | Freq: Two times a day (BID) | INTRAVENOUS | Status: DC
  Administered 2013-04-26 – 2013-04-28 (×5): 1 g via INTRAVENOUS
  Filled 2013-04-26 (×6): qty 1

## 2013-04-26 MED ORDER — ROCURONIUM BROMIDE 100 MG/10ML IV SOLN
INTRAVENOUS | Status: DC | PRN
  Administered 2013-04-26 (×2): 50 mg via INTRAVENOUS

## 2013-04-26 MED ORDER — FENTANYL CITRATE 0.05 MG/ML IJ SOLN
12.5000 ug | Freq: Once | INTRAMUSCULAR | Status: AC | PRN
Start: 2013-04-26 — End: 2013-04-26
  Administered 2013-04-26: 12.5 ug via INTRAVENOUS
  Filled 2013-04-26: qty 2

## 2013-04-26 MED ORDER — ETOMIDATE 2 MG/ML IV SOLN
INTRAVENOUS | Status: AC
  Administered 2013-04-26: 20 mg
  Filled 2013-04-26: qty 10

## 2013-04-26 MED ORDER — VANCOMYCIN HCL IN DEXTROSE 1-5 GM/200ML-% IV SOLN
1000.0000 mg | INTRAVENOUS | Status: DC
  Administered 2013-04-27 – 2013-04-28 (×2): 1000 mg via INTRAVENOUS
  Filled 2013-04-26 (×2): qty 200

## 2013-04-26 MED ORDER — MIDAZOLAM HCL 2 MG/2ML IJ SOLN
INTRAMUSCULAR | Status: AC
  Administered 2013-04-26: 4 mg
  Filled 2013-04-26: qty 4

## 2013-04-26 MED ORDER — PHENYLEPHRINE HCL 10 MG/ML IJ SOLN
INTRAMUSCULAR | Status: DC | PRN
  Administered 2013-04-26: 80 ug via INTRAVENOUS

## 2013-04-26 MED ORDER — FENTANYL CITRATE 0.05 MG/ML IJ SOLN
INTRAMUSCULAR | Status: DC | PRN
  Administered 2013-04-26 (×5): 50 ug via INTRAVENOUS

## 2013-04-26 MED ORDER — FENTANYL BOLUS VIA INFUSION
50.0000 ug | INTRAVENOUS | Status: DC | PRN
  Filled 2013-04-26: qty 100

## 2013-04-26 MED ORDER — VECURONIUM BROMIDE 10 MG IV SOLR
INTRAVENOUS | Status: AC
  Filled 2013-04-26: qty 10

## 2013-04-26 MED ORDER — MIDAZOLAM HCL 5 MG/5ML IJ SOLN
INTRAMUSCULAR | Status: DC | PRN
  Administered 2013-04-26: 2 mg via INTRAVENOUS

## 2013-04-26 MED ORDER — ROCURONIUM BROMIDE 50 MG/5ML IV SOLN
INTRAVENOUS | Status: AC
  Filled 2013-04-26: qty 1

## 2013-04-26 MED ORDER — DEXTROSE 50 % IV SOLN: 25.0000 mL | INTRAVENOUS | Status: DC | PRN

## 2013-04-26 MED ORDER — METRONIDAZOLE IN NACL 5-0.79 MG/ML-% IV SOLN
500.0000 mg | Freq: Four times a day (QID) | INTRAVENOUS | Status: DC
  Administered 2013-04-26 – 2013-04-28 (×8): 500 mg via INTRAVENOUS
  Filled 2013-04-26 (×12): qty 100

## 2013-04-26 MED ORDER — PROPOFOL 10 MG/ML IV BOLUS
INTRAVENOUS | Status: AC
  Filled 2013-04-26: qty 40

## 2013-04-26 MED ORDER — HEPARIN SODIUM (PORCINE) 1000 UNIT/ML IJ SOLN
INTRAMUSCULAR | Status: DC | PRN
  Administered 2013-04-26: 8000 [IU] via INTRAVENOUS

## 2013-04-26 MED ORDER — CEFEPIME HCL 2 G IJ SOLR
2.0000 g | Freq: Once | INTRAMUSCULAR | Status: AC
  Administered 2013-04-26: 2 g via INTRAVENOUS

## 2013-04-26 MED ORDER — CHLORHEXIDINE GLUCONATE 0.12 % MT SOLN
15.0000 mL | Freq: Two times a day (BID) | OROMUCOSAL | Status: DC
  Administered 2013-04-26 – 2013-05-05 (×18): 15 mL via OROMUCOSAL
  Filled 2013-04-26 (×21): qty 15

## 2013-04-26 MED ORDER — MAGNESIUM SULFATE 4000MG/100ML IJ SOLN
4.0000 g | Freq: Once | INTRAMUSCULAR | Status: AC
  Administered 2013-04-26: 4 g via INTRAVENOUS
  Filled 2013-04-26: qty 100

## 2013-04-26 MED ORDER — MIDAZOLAM HCL 2 MG/2ML IJ SOLN
INTRAMUSCULAR | Status: AC
  Administered 2013-04-26: 2 mg
  Filled 2013-04-26: qty 2

## 2013-04-26 MED ORDER — VANCOMYCIN HCL IN DEXTROSE 1-5 GM/200ML-% IV SOLN
1000.0000 mg | Freq: Once | INTRAVENOUS | Status: AC
  Administered 2013-04-26: 1000 mg via INTRAVENOUS
  Filled 2013-04-26: qty 200

## 2013-04-26 MED ORDER — SODIUM CHLORIDE 0.9 % IV SOLN
INTRAVENOUS | Status: DC
  Administered 2013-04-26: 3.4 [IU]/h via INTRAVENOUS
  Administered 2013-04-26: 9.2 [IU]/h via INTRAVENOUS
  Administered 2013-04-27: 11.6 [IU]/h via INTRAVENOUS
  Filled 2013-04-26 (×3): qty 1

## 2013-04-26 MED ORDER — FENTANYL CITRATE 0.05 MG/ML IJ SOLN
INTRAMUSCULAR | Status: AC
  Administered 2013-04-26: 200 ug
  Filled 2013-04-26: qty 4

## 2013-04-26 MED ORDER — MAGNESIUM SULFATE IN D5W 10-5 MG/ML-% IV SOLN
1.0000 g | Freq: Once | INTRAVENOUS | Status: AC
  Administered 2013-04-26: 1 g via INTRAVENOUS
  Filled 2013-04-26: qty 100

## 2013-04-26 MED ORDER — NOREPINEPHRINE BITARTRATE 1 MG/ML IJ SOLN
2.0000 ug/min | INTRAMUSCULAR | Status: DC
  Administered 2013-04-26: 50 ug/min via INTRAVENOUS
  Filled 2013-04-26 (×2): qty 16

## 2013-04-26 MED ORDER — SODIUM CHLORIDE 0.9 % IV SOLN: INTRAVENOUS | Status: DC

## 2013-04-26 MED ORDER — DEXTROSE-NACL 5-0.45 % IV SOLN
INTRAVENOUS | Status: DC
  Administered 2013-04-26 – 2013-04-27 (×3): via INTRAVENOUS
  Administered 2013-04-29: 20 mL/h via INTRAVENOUS

## 2013-04-26 MED ORDER — 0.9 % SODIUM CHLORIDE (POUR BTL) OPTIME
TOPICAL | Status: DC | PRN
  Administered 2013-04-26: 1000 mL

## 2013-04-26 MED ORDER — LACTATED RINGERS IV SOLN: INTRAVENOUS | Status: DC | PRN

## 2013-04-26 MED ORDER — BIOTENE DRY MOUTH MT LIQD
15.0000 mL | Freq: Two times a day (BID) | OROMUCOSAL | Status: DC
  Administered 2013-04-26 (×2): 15 mL via OROMUCOSAL

## 2013-04-26 MED ORDER — FENTANYL CITRATE 0.05 MG/ML IJ SOLN
INTRAMUSCULAR | Status: AC
  Administered 2013-04-26: 100 ug
  Filled 2013-04-26: qty 2

## 2013-04-26 MED ORDER — INSULIN ASPART 100 UNIT/ML ~~LOC~~ SOLN: 0.0000 [IU] | SUBCUTANEOUS | Status: DC

## 2013-04-26 MED ORDER — EPHEDRINE SULFATE 50 MG/ML IJ SOLN
INTRAMUSCULAR | Status: DC | PRN
  Administered 2013-04-26: 10 mg via INTRAVENOUS

## 2013-04-26 MED ORDER — INSULIN REGULAR BOLUS VIA INFUSION
0.0000 [IU] | Freq: Three times a day (TID) | INTRAVENOUS | Status: DC
  Filled 2013-04-26: qty 10

## 2013-04-26 MED ORDER — INSULIN ASPART 100 UNIT/ML ~~LOC~~ SOLN
0.0000 [IU] | SUBCUTANEOUS | Status: DC
  Administered 2013-04-26: 7 [IU] via SUBCUTANEOUS
  Administered 2013-04-26: 5 [IU] via SUBCUTANEOUS

## 2013-04-26 MED ORDER — MIDAZOLAM HCL 2 MG/2ML IJ SOLN
2.0000 mg | INTRAMUSCULAR | Status: DC | PRN
  Administered 2013-04-27 – 2013-04-28 (×4): 2 mg via INTRAVENOUS
  Filled 2013-04-26 (×5): qty 2

## 2013-04-26 MED ORDER — PROTAMINE SULFATE 10 MG/ML IV SOLN
INTRAVENOUS | Status: DC | PRN
  Administered 2013-04-26: 10 mg via INTRAVENOUS
  Administered 2013-04-26: 20 mg via INTRAVENOUS
  Administered 2013-04-26 (×2): 10 mg via INTRAVENOUS

## 2013-04-26 MED ORDER — MIDAZOLAM HCL 2 MG/2ML IJ SOLN
INTRAMUSCULAR | Status: AC
  Filled 2013-04-26: qty 2

## 2013-04-26 SURGICAL SUPPLY — 47 items
BANDAGE ELASTIC 4 VELCRO ST LF (GAUZE/BANDAGES/DRESSINGS) IMPLANT
BANDAGE ELASTIC 6 VELCRO ST LF (GAUZE/BANDAGES/DRESSINGS) IMPLANT
BANDAGE GAUZE ELAST BULKY 4 IN (GAUZE/BANDAGES/DRESSINGS) IMPLANT
BNDG GAUZE ELAST 4 BULKY (GAUZE/BANDAGES/DRESSINGS) ×2 IMPLANT
CANISTER SUCTION 2500CC (MISCELLANEOUS) ×3 IMPLANT
CATH EMB 4FR 40CM (CATHETERS) ×2 IMPLANT
CLIP LIGATING EXTRA MED SLVR (CLIP) ×3 IMPLANT
CLIP LIGATING EXTRA SM BLUE (MISCELLANEOUS) ×3 IMPLANT
COVER PROBE W GEL 5X96 (DRAPES) ×2 IMPLANT
COVER SURGICAL LIGHT HANDLE (MISCELLANEOUS) ×3 IMPLANT
DRAPE U-SHAPE 47X51 STRL (DRAPES) IMPLANT
DRAPE U-SHAPE 76X120 STRL (DRAPES) IMPLANT
DRSG COVADERM 4X6 (GAUZE/BANDAGES/DRESSINGS) ×2 IMPLANT
ELECT REM PT RETURN 9FT ADLT (ELECTROSURGICAL) ×3
ELECTRODE REM PT RTRN 9FT ADLT (ELECTROSURGICAL) ×1 IMPLANT
GLOVE BIOGEL PI IND STRL 6.5 (GLOVE) IMPLANT
GLOVE BIOGEL PI IND STRL 7.0 (GLOVE) IMPLANT
GLOVE BIOGEL PI INDICATOR 6.5 (GLOVE) ×2
GLOVE BIOGEL PI INDICATOR 7.0 (GLOVE) ×4
GLOVE SS BIOGEL STRL SZ 7.5 (GLOVE) ×1 IMPLANT
GLOVE SUPERSENSE BIOGEL SZ 7.5 (GLOVE) ×2
GOWN STRL REUS W/ TWL LRG LVL3 (GOWN DISPOSABLE) ×3 IMPLANT
GOWN STRL REUS W/ TWL XL LVL3 (GOWN DISPOSABLE) IMPLANT
GOWN STRL REUS W/TWL LRG LVL3 (GOWN DISPOSABLE) ×6
GOWN STRL REUS W/TWL XL LVL3 (GOWN DISPOSABLE) ×6
KIT BASIN OR (CUSTOM PROCEDURE TRAY) ×3 IMPLANT
KIT ROOM TURNOVER OR (KITS) ×3 IMPLANT
NS IRRIG 1000ML POUR BTL (IV SOLUTION) ×3 IMPLANT
PACK GENERAL/GYN (CUSTOM PROCEDURE TRAY) ×1 IMPLANT
PACK UNIVERSAL I (CUSTOM PROCEDURE TRAY) ×2 IMPLANT
PAD ARMBOARD 7.5X6 YLW CONV (MISCELLANEOUS) ×6 IMPLANT
SPONGE GAUZE 4X4 12PLY (GAUZE/BANDAGES/DRESSINGS) ×3 IMPLANT
SPONGE GAUZE 4X4 12PLY STER LF (GAUZE/BANDAGES/DRESSINGS) ×2 IMPLANT
STAPLER VISISTAT 35W (STAPLE) IMPLANT
STOPCOCK 4 WAY LG BORE MALE ST (IV SETS) ×2 IMPLANT
SUT ETHILON 3 0 PS 1 (SUTURE) IMPLANT
SUT PROLENE 5 0 C 1 24 (SUTURE) ×4 IMPLANT
SUT PROLENE 6 0 CC (SUTURE) ×4 IMPLANT
SUT VIC AB 2-0 CTX 36 (SUTURE) IMPLANT
SUT VIC AB 3-0 SH 27 (SUTURE)
SUT VIC AB 3-0 SH 27X BRD (SUTURE) IMPLANT
SWAB COLLECTION DEVICE MRSA (MISCELLANEOUS) ×4 IMPLANT
SYR 5ML LL (SYRINGE) ×2 IMPLANT
TAPE CLOTH SURG 4X10 WHT LF (GAUZE/BANDAGES/DRESSINGS) ×2 IMPLANT
TOWEL OR 17X24 6PK STRL BLUE (TOWEL DISPOSABLE) ×3 IMPLANT
TOWEL OR 17X26 10 PK STRL BLUE (TOWEL DISPOSABLE) ×3 IMPLANT
WATER STERILE IRR 1000ML POUR (IV SOLUTION) ×3 IMPLANT

## 2013-04-26 NOTE — Interval H&P Note (Signed)
History and Physical Interval Note:  04/26/2013 12:11 PM  [name] [name]  has presented today for surgery, with the diagnosis of abcess of right groin  The various methods of treatment have been discussed with the patient and family. After consideration of risks, benefits and other options for treatment, the patient has consented to  Procedure(s): IRRIGATION AND DEBRIDEMENT right groin, possible removal of patch (Right) as a surgical intervention .  The patient's history has been reviewed, patient examined, no change in status, stable for surgery.  I have reviewed the patient's chart and labs.  Questions were answered to the patient's satisfaction.     [name]

## 2013-04-26 NOTE — Transfer of Care (Signed)
Immediate Anesthesia Transfer of Care Note  Patient: [name] [name]  Procedure(s) Performed: Procedure(s): IRRIGATION AND DEBRIDEMENT right groin, removal of prostectic patch (Right) Vein PATCH ANGIOPLASTY of common femoral and SFA (Right)  Patient Location: ICU  Anesthesia Type:General  Level of Consciousness: sedated  Airway & Oxygen Therapy: Patient remains intubated per anesthesia plan and Patient placed on Ventilator (see vital sign flow sheet for setting)  Post-op Assessment: Report given to PACU RN and Post -op Vital signs reviewed and stable  Post vital signs: Reviewed and stable  Complications: No apparent anesthesia complications

## 2013-04-26 NOTE — Progress Notes (Signed)
  Subjective  -   Pressor requirement is down Remains on Bipap C/o abd/back pain and right groin pain diarrhea  Physical Exam:  Dressing in right groin removed.  Inferior opening tracks down to femoral artery, however artery is no visualized.  A tunnel exists between the 2 openings, the proximal opening is superficial.  Serous drainage noted Minimal erythema around incision  Doppler pulses bilateral Normal motor function to both legs abd is soft with mild diffuse tenderness       Assessment/Plan:  Right groin infection  The patient needs formal exploration of his right groin in the operating room.  If the patch is exposed it may need replacement with vein.  I am a little concerned that there is something else going on causing him to be so sick.  The groin is the obvious source, however, I do not see any gross purulence.  Agree with checking CDiff given diarrhea and abd pain.  Recommend intubation prior to transport to OR.  Discussed with patient.      Latreece Mochizuki IV, [name] 04/26/2013 9:25 AM --  Filed Vitals:   04/26/13 0852  BP:   Pulse:   Temp: 97.7 F (36.5 C)  Resp:     Intake/Output Summary (Last 24 hours) at 04/26/13 0925 Last data filed at 04/26/13 0700  Gross per 24 hour  Intake 4117.22 ml  Output    200 ml  Net 3917.22 ml     Laboratory CBC    Component Value Date/Time   WBC 15.7* 04/26/2013 0345   HGB 9.7* 04/26/2013 0345   HCT 30.2* 04/26/2013 0345   PLT 173 04/26/2013 0345    BMET    Component Value Date/Time   NA 134* 04/26/2013 0345   K 5.0 04/26/2013 0345   CL 98 04/26/2013 0345   CO2 17* 04/26/2013 0345   GLUCOSE 388* 04/26/2013 0345   BUN 22 04/26/2013 0345   CREATININE 1.74* 04/26/2013 0345   CALCIUM 7.5* 04/26/2013 0345   GFRNONAA 41* 04/26/2013 0345   GFRAA 47* 04/26/2013 0345    COAG Lab Results  Component Value Date   INR 1.58* 04/26/2013   INR 1.84* 03/25/2013   INR 2.48* 03/24/2013   No results found for this basename: PTT     Antibiotics Anti-infectives   Start     Dose/Rate Route Frequency Ordered Stop   04/27/13 0600  vancomycin (VANCOCIN) IVPB 1000 mg/200 mL premix     1,000 mg 200 mL/hr over 60 Minutes Intravenous Every 24 hours 04/26/13 0432     04/26/13 1000  ceFEPIme (MAXIPIME) 1 g in dextrose 5 % 50 mL IVPB     1 g 100 mL/hr over 30 Minutes Intravenous Every 12 hours 04/26/13 0432     04/26/13 1000  metroNIDAZOLE (FLAGYL) IVPB 500 mg     500 mg 100 mL/hr over 60 Minutes Intravenous Every 6 hours 04/26/13 0858     04/26/13 0030  vancomycin (VANCOCIN) IVPB 1000 mg/200 mL premix     1,000 mg 200 mL/hr over 60 Minutes Intravenous  Once 04/26/13 0019 04/26/13 0338   04/26/13 0030  ceFEPIme (MAXIPIME) 2 g in dextrose 5 % 50 mL IVPB     2 g 100 mL/hr over 30 Minutes Intravenous  Once 04/26/13 0019 04/26/13 0127   04/25/13 2215  piperacillin-tazobactam (ZOSYN) IVPB 3.375 g  Status:  Discontinued     3.375 g 100 mL/hr over 30 Minutes Intravenous  Once 04/25/13 2203 04/25/13 2204         Eldridge Abrahams, M.D. [facility] Office: [phone] Pager:  [phone]

## 2013-04-26 NOTE — Procedures (Signed)
Intubation Procedure Note LOUIS IVERY [id number] 1950-04-02  Procedure: Intubation Indications: Respiratory insufficiency  Procedure Details Consent: Risks of procedure as well as the alternatives and risks of each were explained to the (patient/caregiver).  Consent for procedure obtained. Time Out: Verified patient identification, verified procedure, site/side was marked, verified correct patient position, special equipment/implants available, medications/allergies/relevent history reviewed, required imaging and test results available.  Performed  Maximum sterile technique was used including gloves, gown, hand hygiene and mask.  MAC and 3    Evaluation Hemodynamic Status: BP stable throughout; O2 sats: stable throughout Patient's Current Condition: stable Complications: No apparent complications Patient did tolerate procedure well. Chest X-ray ordered to verify placement.  CXR: pending.   [name] 04/26/2013  For OR, pt consented to short ett  [name]. [name], MD, FACP Pgr: [phone] [facility]

## 2013-04-26 NOTE — Progress Notes (Signed)
ANTIBIOTIC CONSULT NOTE - INITIAL  Pharmacy Consult for Vancomycin and Cefepime Indication: bacteremia  Allergies  Allergen Reactions  . Lipitor [Atorvastatin]     Weak muscle  . Lodine [Etodolac] Nausea And Vomiting  . Methotrexate Derivatives Nausea And Vomiting  . Ranitidine Hcl Nausea Only  . Remicade [Infliximab] Other (See Comments)    Chills and shakes     Patient Measurements: Height: 5' 8.9" (175 cm) Weight: 206 lb 12.7 oz (93.8 kg) IBW/kg (Calculated) : 70.47  Vital Signs: Temp: 98.8 F (37.1 C) (01/28 0416) Temp src: Axillary (01/28 0416) BP: 115/63 mmHg (01/28 0201) Pulse Rate: 115 (01/28 0416) Intake/Output from previous day: 01/27 0701 - 01/28 0700 In: 3263.2 [I.V.:2386.2; Blood:677; IV Piggyback:200] Out: -  Intake/Output from this shift: Total I/O In: 3263.2 [I.V.:2386.2; Blood:677; IV Piggyback:200] Out: -   Labs: SCr 1.51 (at [place]) INR 3.1 (at [facility])  Recent Labs  04/26/13 0345  WBC 15.7*  HGB 9.7*  PLT 173  CREATININE 1.74*   Estimated Creatinine Clearance: 51 ml/min (by C-G formula based on Cr of 1.74). No results found for this basename: VANCOTROUGH, VANCOPEAK, VANCORANDOM, GENTTROUGH, GENTPEAK, GENTRANDOM, TOBRATROUGH, TOBRAPEAK, TOBRARND, AMIKACINPEAK, AMIKACINTROU, AMIKACIN,  in the last 72 hours   Microbiology: No results found for this or any previous visit (from the past 720 hour(s)).  Medical History: Past Medical History  Diagnosis Date  . Diabetes mellitus   . Hypertension   . Hyperlipidemia   . Rheumatoid arthritis   . CAD (coronary artery disease)   . Myocardial infarction 2000 & 2006  . GERD (gastroesophageal reflux disease)   . Leg pain   . CHF (congestive heart failure)   . Chronic kidney disease   . PAD (peripheral artery disease)   . Decubitus ulcer of right ankle   . 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 Jude - single chamber ICD  . Pacemaker      Medications:  ASA  Prednisone  Protonix  Zetia  Iron  Lovaza  Oscal  APAP Glucovance  Digoxin  Percocet  Hydralazine  Imdur  PLavix  Neurontin  Lasix  Cozaar  KCl   Coumadin 2.5 mg BID (per [facility] ED records)  Assessment: 63 yo male with sepsis for empiric antibiotics Received Vancomycin 1 g IV at [facility] at 1700 and again at 0230  Goal of Therapy:  Vancomycin trough level 15-20 mcg/ml  Plan:  Vancomycin 1 g IV q24h Cefepime 1 g IV q12h  Eddie Candle 04/26/2013,4:31 AM

## 2013-04-26 NOTE — Progress Notes (Signed)
eLink Physician-Brief Progress Note Patient Name: [name] [name] DOB: 03-08-1951 MRN: [id number]  Date of Service  04/26/2013   HPI/Events of Note   hypomagnesemia  eICU Interventions  Replete Mg   Intervention Category Minor Interventions: Electrolytes abnormality - evaluation and management  MCQUAID, DOUGLAS 04/26/2013, 5:16 AM

## 2013-04-26 NOTE — Progress Notes (Signed)
Right groin wound probed at bedside, fluid cavity encountered consistant with CT scan, approximately 50 cc of serosanguinous fluid under pressure evacuated.  Culture of this fluid sent.  Wound care per vascular team only as this wound goes all the way down to his femoral artery.  Fabienne Bruns, MD [facility] [place] Office: [phone] Pager: [phone]

## 2013-04-26 NOTE — Op Note (Signed)
    OPERATIVE REPORT  DATE OF SURGERY: 04/26/2013  PATIENT: [name] [name], 63 y.o. male MRN: [id number]  DOB: 1950-12-07  PRE-OPERATIVE DIAGNOSIS: Right groin infection status post endarterectomy and patch with bovine pericardial patch  POST-OPERATIVE DIAGNOSIS:  Same  PROCEDURE: Groin exploration and debridement and removal of bovine pericardial patch and placement of vein patch, saphenous vein  SURGEON:  Gretta Began, M.D.  PHYSICIAN ASSISTANT: Collins  ANESTHESIA:  Gen.  EBL: 200 ml  Total I/O In: 2046.7 [I.V.:1796.7; IV Piggyback:250] Out: 625 [Urine:375; Blood:250]  BLOOD ADMINISTERED: One unit packed cells  DRAINS: None  SPECIMEN: Cultures from incision  COUNTS CORRECT:  YES  PLAN OF CARE: ICU   PATIENT DISPOSITION:  PACU - hemodynamically stable  PROCEDURE DETAILS: The patient is status post right femoral exploration and endarterectomy and bovine pericardial patch several weeks ago for acute limb threatening ischemia. He presented yesterday with drainage from his groin and some hemodynamic instability. Using the premature today for exploration. No evidence of bleeding. Is felt that this may be superficial or maybe down to the artery.  The right groin and and thigh were prepped and draped in usual sterile fashion. The incision was opened and there were several areas of loculated pus deep down in the fat necrosis around the groin. There was 6 exposed a bovine pericardial patch. On further exploration there is also Dacron. Be a pledget at the upper pole the incision under the inguinal ligament and also several braided sutures from her prior percutaneous closure device catheterization. Scissors made to remove the patch. All nonviable tissue was debrided and no further loculations were encountered. The skin was debridement as well. SonoSite ultrasound was used to image the saphenous vein in the proximal thigh. This was of good caliber. A separate incision was made over  this level and the saphenous vein was harvested over approximately 4-5 cm. The vein was ligated proximally and distally with 2-0 silk ties. The vein was gently dilated and was excellent caliber for patch. The patient was given 8000 intravenous heparin after adequate circulation time the common superficial femoral and function was arteries were occluded. The bovine pericardial patch was removed in its entirety as were the Prolene sutures. The Dacron Pledget at the uppermost pole of the arteriotomy was also removed in its entirety. Several braided sutures at the base of the were also removed. The vein was spatulated and was sewn as a patch angioplasty with a running 5-0 Prolene suture over the extent of the incision. The arteriotomy didn't extend down the superficial femoral artery this was extended slightly from the arteriotomy additional coral reef type plaque was removed. Prior to completion of the closure the usual flushing maneuvers were undertaken. Anastomosis completed and flow is restored to the right leg. Excellent Doppler flow was noted in the superficial femoral and in the deep femoral arteries. The patient was given 50 mg of protamine to reverse the heparin. The vein harvest incision was closed with 2-0 Vicryl the subcutaneous and 3-0 Vicryl in subcuticular tissue. The groin again was debrided and irrigated hemostasis electrocautery. The fat was closed over the vein patch with a single layer of 2-0 Vicryl suture. A Betadine soaked Kerlix was positioned in the base of the wound. A sterile dressing was placed over this the patient was transferred to the intensive care unit in critical condition   Gretta Began, M.D. 04/26/2013 3:43 PM

## 2013-04-26 NOTE — H&P (View-Only) (Signed)
Subjective  -   Pressor requirement is down Remains on Bipap C/o abd/back pain and right groin pain diarrhea  Physical Exam:  Dressing in right groin removed.  Inferior opening tracks down to femoral artery, however artery is no visualized.  A tunnel exists between the 2 openings, the proximal opening is superficial.  Serous drainage noted Minimal erythema around incision  Doppler pulses bilateral Normal motor function to both legs abd is soft with mild diffuse tenderness       Assessment/Plan:  Right groin infection  The patient needs formal exploration of his right groin in the operating room.  If the patch is exposed it may need replacement with vein.  I am a little concerned that there is something else going on causing him to be so sick.  The groin is the obvious source, however, I do not see any gross purulence.  Agree with checking CDiff given diarrhea and abd pain.  Recommend intubation prior to transport to OR.  Discussed with patient.      Marsden Zaino IV, [name] 04/26/2013 9:25 AM --  Filed Vitals:   04/26/13 0852  BP:   Pulse:   Temp: 97.7 F (36.5 C)  Resp:     Intake/Output Summary (Last 24 hours) at 04/26/13 0925 Last data filed at 04/26/13 0700  Gross per 24 hour  Intake 4117.22 ml  Output    200 ml  Net 3917.22 ml     Laboratory CBC    Component Value Date/Time   WBC 15.7* 04/26/2013 0345   HGB 9.7* 04/26/2013 0345   HCT 30.2* 04/26/2013 0345   PLT 173 04/26/2013 0345    BMET    Component Value Date/Time   NA 134* 04/26/2013 0345   K 5.0 04/26/2013 0345   CL 98 04/26/2013 0345   CO2 17* 04/26/2013 0345   GLUCOSE 388* 04/26/2013 0345   BUN 22 04/26/2013 0345   CREATININE 1.74* 04/26/2013 0345   CALCIUM 7.5* 04/26/2013 0345   GFRNONAA 41* 04/26/2013 0345   GFRAA 47* 04/26/2013 0345    COAG Lab Results  Component Value Date   INR 1.58* 04/26/2013   INR 1.84* 03/25/2013   INR 2.48* 03/24/2013   No results found for this basename: PTT     Antibiotics Anti-infectives   Start     Dose/Rate Route Frequency Ordered Stop   04/27/13 0600  vancomycin (VANCOCIN) IVPB 1000 mg/200 mL premix     1,000 mg 200 mL/hr over 60 Minutes Intravenous Every 24 hours 04/26/13 0432     04/26/13 1000  ceFEPIme (MAXIPIME) 1 g in dextrose 5 % 50 mL IVPB     1 g 100 mL/hr over 30 Minutes Intravenous Every 12 hours 04/26/13 0432     04/26/13 1000  metroNIDAZOLE (FLAGYL) IVPB 500 mg     500 mg 100 mL/hr over 60 Minutes Intravenous Every 6 hours 04/26/13 0858     04/26/13 0030  vancomycin (VANCOCIN) IVPB 1000 mg/200 mL premix     1,000 mg 200 mL/hr over 60 Minutes Intravenous  Once 04/26/13 0019 04/26/13 0338   04/26/13 0030  ceFEPIme (MAXIPIME) 2 g in dextrose 5 % 50 mL IVPB     2 g 100 mL/hr over 30 Minutes Intravenous  Once 04/26/13 0019 04/26/13 0127   04/25/13 2215  piperacillin-tazobactam (ZOSYN) IVPB 3.375 g  Status:  Discontinued     3.375 g 100 mL/hr over 30 Minutes Intravenous  Once 04/25/13 2203 04/25/13 2204  Jorge Ny, M.D. [facility] [place] [facility] Office: [phone] Pager:  [phone]

## 2013-04-26 NOTE — Progress Notes (Signed)
OET pulled back 1 cm to 24 at lip- per MD order

## 2013-04-26 NOTE — Progress Notes (Signed)
Dr. [name] intubated pt w/ #8 OET secure at 25 at lip. + easy cap color change, +BBSH =clear diminished. NO distress noted.

## 2013-04-26 NOTE — ED Provider Notes (Addendum)
Medical screening examination/treatment/procedure(s) were conducted as a shared visit with non-physician practitioner(s) or resident  and myself.  I personally evaluated the patient during the encounter and agree with the findings and plan unless otherwise indicated.    I have personally reviewed any xrays and/ or EKG's with the provider and I agree with interpretation.   Recent right iliac stent, discharge at site, sent from outside hospital with sepsis.  Pt bp 60s on arrival, clinically septic shock.  Long discussion with wife/ patient, he does not want to be intubated even if it leads to death, he understands r/b of his decisions. Pt in respiratory distress, mild rales bilateral, abd soft/ NT, right inguinal region mild pus draining/ tender/ mild swelling.  Dry mm.  Left femoral central line placed [place] guided by resident, I was present for key portion.  NE titrated upwards, pt improved initially then hypotension returned. Pt had 4 L fluids PTA, we gave 3 L fluids.  Bedside US FAST neg, IVC showed 50% collapse however pt on bipap.   Discussed severity of disease with family, they understand.  ICU and vascular consulted.  Solumedrol Emergency Ultrasound Study:   Angiocath insertion Performed by: Enid Skeens  Consent: Verbal consent obtained. Risks and benefits: risks, benefits and alternatives were discussed Immediately prior to procedure the correct patient, procedure, equipment, support staff and site/side marked as needed.  Indication: difficult IV access Preparation: Patient was prepped and draped in the usual sterile fashion. Vein Location: left femoral vein was visualized during assessment for potential access sites and was found to be patent/ easily compressed with linear ultrasound.  The needle was visualized with real-time ultrasound and guided into the femoral vein. Central line L femoral vein  Image saved and stored.  Normal blood return.  Patient tolerance: Patient tolerated  the procedure well with no immediate complications.  Labs Reviewed  PRO B NATRIURETIC PEPTIDE - Abnormal; Notable for the following:    Pro B Natriuretic peptide (BNP) 8808.0 (*)    All other components within normal limits  POCT I-STAT 3, BLOOD GAS (G3+) - Abnormal; Notable for the following:    pH, Arterial 7.349 (*)    pO2, Arterial 134.0 (*)    Acid-base deficit 5.0 (*)    All other components within normal limits  CG4 I-STAT (LACTIC ACID) - Abnormal; Notable for the following:    Lactic Acid, Venous 4.57 (*)    All other components within normal limits  WOUND CULTURE  WOUND CULTURE  COMPREHENSIVE METABOLIC PANEL  CBC  PROTIME-INR  TROPONIN I  TROPONIN I  TROPONIN I  CORTISOL  TYPE AND SCREEN  PREPARE FRESH FROZEN PLASMA    Ultrasound limited abdominal and limited transthoracic ultrasound (FAST)  Indication: Hypotension Four views were obtained using the low frequency transducer: Splenorenal, Hepatorenal, Retrovesical, Pericardial subxyphoid Interpretation: No free fluid visualized surrounding the kidneys, pelvis or pericardium. Images archived electronically Dr. Jodi Mourning personally performed and interpreted the images    CRITICAL CARE Performed by: Enid Skeens   Total critical care time: 75 min  Critical care time was exclusive of separately billable procedures and treating other patients.  Critical care was necessary to treat or prevent imminent or life-threatening deterioration.  Critical care was time spent personally by me on the following activities: development of treatment plan with patient and/or surrogate as well as nursing, discussions with consultants, evaluation of patient's response to treatment, examination of patient, obtaining history from patient or surrogate, ordering and performing treatments and interventions, ordering  and review of laboratory studies, ordering and review of radiographic studies, pulse oximetry and re-evaluation of patient's  condition.  Septic Shock, lactic acidosis Filed Vitals:   04/26/13 0005 04/26/13 0009 04/26/13 0040 04/26/13 0100  BP: 78/57 70/50 89/57    Pulse: 107   114  Resp: 24  35 31  SpO2: 99%  100% 98%    , MD 04/26/13 0107  04/28/13, MD 04/26/13 04/28/13

## 2013-04-26 NOTE — Procedures (Signed)
Arterial Catheter Insertion Procedure Note [name] [name] [id number] Mar 02, 1951  Procedure: Insertion of Arterial Catheter  Indications: Blood pressure monitoring and Frequent blood sampling  Procedure Details Consent: Risks of procedure as well as the alternatives and risks of each were explained to the (patient/caregiver).  Consent for procedure obtained. Time Out: Verified patient identification, verified procedure, site/side was marked, verified correct patient position, special equipment/implants available, medications/allergies/relevent history reviewed, required imaging and test results available.  Performed  Maximum sterile technique was used including antiseptics, cap, gloves, gown, hand hygiene, mask and sheet. Skin prep: Chlorhexidine; local anesthetic administered 20 gauge catheter was inserted into right radial artery using the Seldinger technique.  Evaluation Blood flow good; BP tracing good. Complications: No apparent complications.   Procedure performed with ultrasound guidance for real time vessel cannulation.    [name] Brim, NP-C [facility] Pgr: [phone] or [phone]   04/26/2013  [name] Fischer, MD ; [facility] service Mobile [phone].  After 5:30 PM or weekends, call [phone]

## 2013-04-26 NOTE — Progress Notes (Signed)
[facility] ADULT ICU REPLACEMENT PROTOCOL FOR AM LAB REPLACEMENT ONLY  The patient does not apply for the [facility] Adult ICU Electrolyte Replacment Protocol based on the criteria listed below:   1. Is GFR >/= 40 ml/min? yes  Patient's GFR today is 41 2. Is urine output >/= 0.5 ml/kg/hr for the last 6 hours? no Patient's UOP is  URINE OCCURENCES 3. Is BUN < 60 mg/dL? yes  Patient's BUN today is  4. Abnormal electrolyte(s):  Mag 1.3 5. Ordered repletion with: NA 6. If a panic level lab has been reported, has the CCM MD in charge been notified? yes.   Physician:  Dr Albertina Parr, Cordero Surette A 04/26/2013 5:11 AM

## 2013-04-26 NOTE — Progress Notes (Signed)
PULMONARY/CRITICAL CARE  Name: [name] [name] MRN: [id number] DOB: 1950/09/24    ADMISSION DATE:  04/25/2013 CONSULTATION DATE:  04/25/13  REFERRING MD :  ED PRIMARY SERVICE: PCCM  CHIEF COMPLAINT:  Shaking chills, weakness   BRIEF PATIENT DESCRIPTION: [name] is a 63 y.o. male w/ PMHx of CAD s/p CABG, CHF w/ AICD/PPM (04/06/12), A-fib (on Coumadin) PAD s/p R. Femoral endarterectomy and R/ Iliac stent placement,  CKD, HTN, HLD, DM type II, transferred from [facility] ED w/ likely septic shock 2/2 bacteremia and acute respiratory failure requiring BiPAP.   SIGNIFICANT EVENTS / STUDIES:  1/27 - Transferred from [facility] ED w/ hypotension 1/27 - L. Femoral line placed, started on Levophed  LINES / TUBES: L. Femoral CVL 1/27 >>>  CULTURES: Blood 1/27 >>> Urine 1/27 >>> R. Femoral wound 1/27 >>>  ANTIBIOTICS: Vancomycin 1/27 >>> Cefepime 1/27 >>>  HISTORY OF PRESENT ILLNESS:  [name] is a 63 y.o. male w/ PMHx of CAD s/p CABG, CHF w/ AICD/PPM (04/06/12), A-fib (on Coumadin) PAD s/p R. Femoral endarterectomy and R/ Iliac stent placement,  CKD, HTN, HLD, DM type II, transferred from [facility] ED after having recent severe weakness, and shaking chills at home for the past few days. In ED, found to be desatting, placed on BiPAP. Hypotensive on arrival to [facility], R. Femoral line placed, Levophed started. Patient likely w/ septic shock 2/2 bacteremia.   SUBJECTIVE:  On Bipap, denies pain.  On Levophed and vasopressin  VITAL SIGNS: Temp:  [97.8 F (36.6 C)-99.6 F (37.6 C)] 97.8 F (36.6 C) (01/28 0537) Pulse Rate:  [47-146] 88 (01/28 0645) Resp:  [17-35] 19 (01/28 0645) BP: (59-131)/(18-99) 96/50 mmHg (01/28 0600) SpO2:  [84 %-100 %] 100 % (01/28 0645) Arterial Line BP: (79-144)/(43-72) 79/43 mmHg (01/28 0645) FiO2 (%):  [40 %-60 %] 40 % (01/28 0630) Weight:  [196 lb 3.4 oz (89 kg)-206 lb 12.7 oz (93.8 kg)] 206 lb 12.7 oz (93.8 kg) (01/28  0145)  HEMODYNAMICS:   VENTILATOR SETTINGS: Vent Mode:  [-] BIPAP FiO2 (%):  [40 %-60 %] 40 % Set Rate:  [16 bmp] 16 bmp PEEP:  [5 cmH20] 5 cmH20 Pressure Support:  [10 cmH20] 10 cmH20 INTAKE / OUTPUT: Intake/Output     01/27 0701 - 01/28 0700 01/28 0701 - 01/29 0700   I.V. (mL/kg) 2444.5 (26.1)    Blood 1352    IV Piggyback 300    Total Intake(mL/kg) 4096.5 (43.7)    Urine (mL/kg/hr) 200    Total Output 200     Net +3896.5          Urine Occurrence 1 x    Stool Occurrence 2 x      PHYSICAL EXAMINATION: General:  lethargic. On BiPAP Neuro:   Moves all limbs spontaneously.  HEENT:  [place]/AT on BIPAP Cardiovascular:  RRR. No murmurs, gallops, rubs.  Lungs:  Clear to ausculation bilaterally. No wheezes or crackles.  Abdomen:  Soft, non-tender, non-distended. BS +. R. Femoral wound bandage c/d/i.  Musculoskeletal:  No LE edema.  Skin:  Multiple small ulcers present on upper and lower extremities. No erythema, or rash.   LABS:  CBC  Recent Labs Lab 04/26/13 0345  WBC 15.7*  HGB 9.7*  HCT 30.2*  PLT 173   Coag's No results found for this basename: APTT, INR,  in the last 168 hours BMET  Recent Labs Lab 04/26/13 0345  NA 134*  K 5.0  CL 98  CO2 17*  BUN  22  CREATININE 1.74*  GLUCOSE 388*   Electrolytes  Recent Labs Lab 04/26/13 0345  CALCIUM 7.5*  MG 1.3*   Sepsis Markers  Recent Labs Lab 04/25/13 2210  LATICACIDVEN 4.57*   ABG  Recent Labs Lab 04/25/13 2151  PHART 7.349*  PCO2ART 36.6  PO2ART 134.0*   Liver Enzymes  Recent Labs Lab 04/26/13 0345  AST 59*  ALT 28  ALKPHOS 72  BILITOT 1.1  ALBUMIN 2.4*    Cardiac Enzymes  Recent Labs Lab 04/25/13 2205  PROBNP 8808.0*   Glucose  Recent Labs Lab 04/26/13 0251 04/26/13 0417  GLUCAP 290* 348*    CXR: no infiltrates  ASSESSMENT / PLAN:  PULMONARY A: Acute respiratory failure, on BiPAP DNI P:   Continue BiPAP 4 hrs on 1 hr off x 24 hrs Goal SpO2 >88% pcxr in  am  Does not want intubation in the event of failing BiPAP, i would like to revisit this as has infection we can treat, consider ett  If needed for short duration  CARDIOVASCULAR A:  Hypotensive, likely 2/2 septic shock Recent R. Femoral endarterectomy w/ iliac stent. Poor wound healing. H/o CAD s/p CABG H/o A-fib, on Coumadin.  Chronic RBBB  P:  Continue Levophed + Vasopressin. Wean as tolerated. Solu-Cortef 50 mg IV q6h (on prednisone 5 bid at home). vascular surgery following Continue NS @ 125 ml/hr Some lactic acid clearance ECHO r/o veg Trop noted, repeat x 1 Hold Coreg, Lasix, Hydralazine, Losartan, Imdur, ASA, digoxin, Coumadin Consider cvp placement  RENAL A:   Hypomagnesemia Acute Renal Failure, ATN Mild hyponatremia Lactic acidosis P:   NS @ 125 ml/hr, follow bemt for cl rise and non ag Mag suppliment bmet in pm and am   GASTROINTESTINAL A:   H/o GERD On home PPI Nutrition Diarhea, r./o cidf, at risk ischemia P:   NPO while on BiPAP Protonix Assess ldh, cdiff, follow bicarb  HEMATOLOGIC A:   A-fib on Coumadin No Leukocytosis DVT/PE PPx S/P 4 units FFP P: .  Trend PT/INR CBC in AM SCD's for now Start heparin after surgery in days  INFECTIOUS A:   Suspected septic shock 2/2 possible bacteremia s/p endarterectomy, r/o endocarditis / pacer, r/o cdiff P:   Abx; Vancomycin + Cefepime Add flagyl  For anearobic Trend CBC Monitor R. Femoral wound Trend Lactic acid, clearing Send cdiff iof more stool, add oral vanc echo  ENDOCRINE A:   H/o DM type II On chronic steroids at home, adrenal insuff Hyperglycemia P:   Solu-Cortef as above CBG's q4h Add insulin drip  NEUROLOGIC A:   Lethargic, agitated. P:   Continue to monitor fent prn  Gust Rung, DO 04/26/2013 7:15 AM  I have fully examined this patient and agree with above findings.    And edited in full  Ccm tim 50 min   [name]. [name], MD, FACP Pgr:  [phone] [place]

## 2013-04-26 NOTE — Progress Notes (Signed)
INITIAL NUTRITION ASSESSMENT  DOCUMENTATION CODES Per approved criteria  -Not Applicable   INTERVENTION:  If unable to extubate patient, recommend initiate TF via OGT with Vital AF 1.2 at 25 ml/h today per PEPUP protocol; on day 2, increase to goal rate of 70 ml/h to provide 2016 kcals, 126 gm protein, 1362 ml free water daily.  NUTRITION DIAGNOSIS: Inadequate oral intake related to inability to eat as evidenced by NPO status.   Goal: Intake to meet >90% of estimated nutrition needs.  Monitor:  TF initiation/tolerance/adequacy, weight trend, labs, vent status.  Reason for Assessment: VDRF  63 y.o. male  Admitting Dx:   ASSESSMENT: Patient is a 63 y.o. male w/ PMHx of CAD s/p CABG, CHF w/ AICD/PPM (04/06/12), A-fib (on Coumadin) PAD s/p R. Femoral endarterectomy and R/ Iliac stent placement, CKD, HTN, HLD, DM type II, transferred from [facility] ED on 1/27 with likely septic shock 2/2 bacteremia and acute respiratory failure requiring BiPAP. Required intubation earlier today.  Currently in the OR for I&D of right groin abscess.  Patient is currently intubated on ventilator support.  MV: 9.1 L/min Temp (24hrs), Avg:98.8 F (37.1 C), Min:97.7 F (36.5 C), Max:99.6 F (37.6 C)   Height: Ht Readings from Last 1 Encounters:  04/26/13 5' 8.9" (1.75 m)    Weight: Wt Readings from Last 1 Encounters:  04/26/13 206 lb 12.7 oz (93.8 kg)    Ideal Body Weight: 72.7 kg  % Ideal Body Weight: 129%  Wt Readings from Last 10 Encounters:  04/26/13 206 lb 12.7 oz (93.8 kg)  04/26/13 206 lb 12.7 oz (93.8 kg)  04/19/13 196 lb 12.8 oz (89.268 kg)  03/25/13 196 lb (88.905 kg)  03/25/13 196 lb (88.905 kg)  03/25/13 196 lb (88.905 kg)  03/08/13 216 lb (97.977 kg)  09/07/12 206 lb (93.441 kg)  03/09/12 216 lb (97.977 kg)  09/03/11 220 lb 4.8 oz (99.927 kg)    Usual Body Weight: 196 lb  % Usual Body Weight: 105%  BMI:  Body mass index is 30.63 kg/(m^2). Weight up likely  related to positive fluid status. BMI using usual weight of 196 lb is 29.  Estimated Nutritional Needs: Kcal: 1980 Protein: 110-130 gm Fluid: 2-2.2 L  Skin: right groin abscess, right ankle wound  Diet Order: NPO  EDUCATION NEEDS: -Education not appropriate at this time   Intake/Output Summary (Last 24 hours) at 04/26/13 1324 Last data filed at 04/26/13 1300  Gross per 24 hour  Intake 4910.19 ml  Output    375 ml  Net 4535.19 ml    Last BM: 1/28   Labs:   Recent Labs Lab 04/26/13 0345 04/26/13 0930  NA 134* 135*  K 5.0 5.0  CL 98 98  CO2 17* 16*  BUN 22 25*  CREATININE 1.74* 1.51*  CALCIUM 7.5* 7.8*  MG 1.3*  --   GLUCOSE 388* 429*    CBG (last 3)   Recent Labs  04/26/13 0251 04/26/13 0417 04/26/13 0823  GLUCAP 290* 348* 386*    Scheduled Meds: . antiseptic oral rinse  15 mL Mouth Rinse q12n4p  . ceFEPime (MAXIPIME) IV  1 g Intravenous Q12H  . chlorhexidine  15 mL Mouth Rinse BID  . hydrocortisone sod succinate (SOLU-CORTEF) inj  50 mg Intravenous Q6H  . insulin regular  0-10 Units Intravenous TID WC  . magnesium sulfate 1 - 4 g bolus IVPB  4 g Intravenous Once  . metronidazole  500 mg Intravenous Q6H  . pantoprazole (PROTONIX) IV  40 mg Intravenous QHS  . [START ON 04/27/2013] vancomycin  1,000 mg Intravenous Q24H  . vecuronium        Continuous Infusions: . sodium chloride 125 mL/hr at 04/26/13 0921  . dextrose 5 % and 0.45% NaCl    . fentaNYL infusion INTRAVENOUS 100 mcg/hr (04/26/13 1145)  . insulin (NOVOLIN-R) infusion 12 Units/hr (04/26/13 1243)  . norepinephrine (LEVOPHED) Adult infusion 16 mcg/min (04/26/13 1200)  . vasopressin (PITRESSIN) infusion - *FOR SHOCK* 0.03 Units/min (04/26/13 0033)    Past Medical History  Diagnosis Date  . Diabetes mellitus   . Hypertension   . Hyperlipidemia   . Rheumatoid arthritis   . CAD (coronary artery disease)   . Myocardial infarction 2000 & 2006  . GERD (gastroesophageal reflux disease)    . Leg pain   . CHF (congestive heart failure)   . Chronic kidney disease   . PAD (peripheral artery disease)   . Decubitus ulcer of right ankle   . 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 Jude - single chamber ICD  . Pacemaker     Past Surgical History  Procedure Laterality Date  . Hernia repair    . Coronary artery bypass graft    . Carpal tunnel release    . Knee surgery    . Cardiac defibrillator placement  04/08/2012    St. Jude Device - single chamber ICD  . Difibulator    . Patch angioplasty Right 03/14/2013    Procedure: PATCH ANGIOPLASTY Right Common Femoral, Superficial Femoral and Profunda-Femoral;  Surgeon: Nada Libman, MD;  Location: [facility] OR;  Service: Vascular;  Laterality: Right;  . Abdominal aortagram N/A 03/14/2013    Procedure: ABDOMINAL AORTAGRAM with right leg runoff.  Catheter in aorta times one;  Surgeon: Nada Libman, MD;  Location: [facility] OR;  Service: Vascular;  Laterality: N/A;  . Insertion of iliac stent Right 03/14/2013    Procedure: INSERTION OF ILIAC STENT Right Common and External ;  Surgeon: Nada Libman, MD;  Location: [facility] OR;  Service: Vascular;  Laterality: Right;    Joaquin Courts, RD, LDN, CNSC Pager [phone] After Hours Pager [phone]

## 2013-04-26 NOTE — Progress Notes (Signed)
Chaplain was  Called by ED to support pt's family. Pt's wife and three grown children were in consult room B. I visited with them, providing emotional support and ministry of presence. Served as Print production planner with medical staff in Trauma A to learn when family could be at bedside. Was present with family during three different medical briefings. Family expressed to medical staff the pt's desire not to be intubated.

## 2013-04-26 NOTE — Anesthesia Preprocedure Evaluation (Addendum)
Anesthesia Evaluation  Patient identified by MRN, date of birth, ID band  Reviewed: Allergy & Precautions, H&P , NPO status , Patient's Chart, lab work & pertinent test results  Airway       Dental   Pulmonary former smoker,          Cardiovascular hypertension, Pt. on medications + CAD + Cardiac Defibrillator     Neuro/Psych    GI/Hepatic   Endo/Other  diabetes, Poorly Controlled, Type 2, Oral Hypoglycemic AgentsMorbid obesity  Renal/GU      Musculoskeletal   Abdominal   Peds  Hematology   Anesthesia Other Findings Pt on ventilator form intubation this AM.  Reproductive/Obstetrics                          Anesthesia Physical Anesthesia Plan  ASA: IV  Anesthesia Plan: General   Post-op Pain Management:    Induction: Inhalational  Airway Management Planned: Oral ETT  Additional Equipment:   Intra-op Plan:   Post-operative Plan: Post-operative intubation/ventilation  Informed Consent: I have reviewed the patients History and Physical, chart, labs and discussed the procedure including the risks, benefits and alternatives for the proposed anesthesia with the patient or authorized representative who has indicated his/her understanding and acceptance.     Plan Discussed with: CRNA and Anesthesiologist  Anesthesia Plan Comments:         Anesthesia Quick Evaluation

## 2013-04-26 NOTE — Procedures (Signed)
Central Venous Catheter Insertion Procedure Note [name] [name] [id number] January 19, 1951  Procedure: Insertion of Central Venous Catheter Indications: Assessment of intravascular volume, Drug and/or fluid administration and Frequent blood sampling  Procedure Details Consent: Unable to obtain consent because of emergent medical necessity. Time Out: Verified patient identification, verified procedure, site/side was marked, verified correct patient position, special equipment/implants available, medications/allergies/relevent history reviewed, required imaging and test results available.  Performed  Maximum sterile technique was used including antiseptics, cap, gloves, gown, hand hygiene, mask and sheet. Skin prep: Chlorhexidine; local anesthetic administered A antimicrobial bonded/coated triple lumen catheter was placed in the right internal jugular vein using the Seldinger technique.  Evaluation Blood flow good Complications: No apparent complications Patient did tolerate procedure well. Chest X-ray ordered to verify placement.  CXR: pending.  [name] 04/26/2013, 11:24 AM  [place] Moved major durint procedure with needle in neck, resedated  [name]. [name], MD, FACP Pgr: [phone] [place] [facility]

## 2013-04-26 NOTE — Progress Notes (Signed)
  Echocardiogram 2D Echocardiogram has been performed.  [name] [name] FRANCES 04/26/2013, 5:50 PM

## 2013-04-27 ENCOUNTER — Encounter (HOSPITAL_COMMUNITY): Payer: Self-pay | Admitting: Vascular Surgery

## 2013-04-27 ENCOUNTER — Inpatient Hospital Stay (HOSPITAL_COMMUNITY): Payer: Medicare Other

## 2013-04-27 DIAGNOSIS — I509 Heart failure, unspecified: Secondary | ICD-10-CM

## 2013-04-27 DIAGNOSIS — I5023 Acute on chronic systolic (congestive) heart failure: Secondary | ICD-10-CM

## 2013-04-27 DIAGNOSIS — J9600 Acute respiratory failure, unspecified whether with hypoxia or hypercapnia: Secondary | ICD-10-CM

## 2013-04-27 DIAGNOSIS — I4891 Unspecified atrial fibrillation: Secondary | ICD-10-CM

## 2013-04-27 LAB — GLUCOSE, CAPILLARY
GLUCOSE-CAPILLARY: 194 mg/dL — AB (ref 70–99)
GLUCOSE-CAPILLARY: 145 mg/dL — AB (ref 70–99)
GLUCOSE-CAPILLARY: 114 mg/dL — AB (ref 70–99)
GLUCOSE-CAPILLARY: 83 mg/dL (ref 70–99)
GLUCOSE-CAPILLARY: 87 mg/dL (ref 70–99)
GLUCOSE-CAPILLARY: 94 mg/dL (ref 70–99)
Glucose-Capillary: 106 mg/dL — ABNORMAL HIGH (ref 70–99)
Glucose-Capillary: 111 mg/dL — ABNORMAL HIGH (ref 70–99)
Glucose-Capillary: 130 mg/dL — ABNORMAL HIGH (ref 70–99)
Glucose-Capillary: 137 mg/dL — ABNORMAL HIGH (ref 70–99)
Glucose-Capillary: 144 mg/dL — ABNORMAL HIGH (ref 70–99)
Glucose-Capillary: 147 mg/dL — ABNORMAL HIGH (ref 70–99)
Glucose-Capillary: 155 mg/dL — ABNORMAL HIGH (ref 70–99)
Glucose-Capillary: 157 mg/dL — ABNORMAL HIGH (ref 70–99)
Glucose-Capillary: 161 mg/dL — ABNORMAL HIGH (ref 70–99)
Glucose-Capillary: 192 mg/dL — ABNORMAL HIGH (ref 70–99)
Glucose-Capillary: 194 mg/dL — ABNORMAL HIGH (ref 70–99)
Glucose-Capillary: 202 mg/dL — ABNORMAL HIGH (ref 70–99)
Glucose-Capillary: 77 mg/dL (ref 70–99)
Glucose-Capillary: 85 mg/dL (ref 70–99)
Glucose-Capillary: 91 mg/dL (ref 70–99)
Glucose-Capillary: 93 mg/dL (ref 70–99)
Glucose-Capillary: 98 mg/dL (ref 70–99)

## 2013-04-27 LAB — TROPONIN I: Troponin I: 0.34 ng/mL (ref <0.30)

## 2013-04-27 LAB — BASIC METABOLIC PANEL
BUN: 25 mg/dL — AB (ref 6–23)
BUN: 18 mg/dL (ref 6–23)
CHLORIDE: 106 meq/L (ref 96–112)
CO2: 19 mEq/L (ref 19–32)
CO2: 19 meq/L (ref 19–32)
CREATININE: 0.82 mg/dL (ref 0.50–1.35)
Calcium: 7.5 mg/dL — ABNORMAL LOW (ref 8.4–10.5)
Calcium: 7.8 mg/dL — ABNORMAL LOW (ref 8.4–10.5)
Chloride: 105 mEq/L (ref 96–112)
Creatinine, Ser: 1.02 mg/dL (ref 0.50–1.35)
GFR calc Af Amer: >90 mL/min (ref >90)
GFR calc Af Amer: >90 mL/min (ref >90)
GFR calc non Af Amer: 78 mL/min — ABNORMAL LOW (ref >90)
GFR calc non Af Amer: >90 mL/min (ref >90)
GLUCOSE: 99 mg/dL (ref 70–99)
Glucose, Bld: 139 mg/dL — ABNORMAL HIGH (ref 70–99)
Potassium: 3.6 mEq/L — ABNORMAL LOW (ref 3.7–5.3)
Potassium: 4.3 mEq/L (ref 3.7–5.3)
Sodium: 139 mEq/L (ref 137–147)
Sodium: 137 mEq/L (ref 137–147)

## 2013-04-27 LAB — CBC
HEMATOCRIT: 26.6 % — AB (ref 39.0–52.0)
Hemoglobin: 8.7 g/dL — ABNORMAL LOW (ref 13.0–17.0)
MCH: 27.2 pg (ref 26.0–34.0)
MCHC: 32.7 g/dL (ref 30.0–36.0)
MCV: 83.1 fL (ref 78.0–100.0)
Platelets: 100 10*3/uL — ABNORMAL LOW (ref 150–400)
RBC: 3.2 MIL/uL — ABNORMAL LOW (ref 4.22–5.81)
RDW: 18.2 % — ABNORMAL HIGH (ref 11.5–15.5)
WBC: 8.1 10*3/uL (ref 4.0–10.5)

## 2013-04-27 LAB — PREPARE FRESH FROZEN PLASMA
UNIT DIVISION: 0
Unit division: 0
Unit division: 0
Unit division: 0

## 2013-04-27 LAB — POCT I-STAT 3, ART BLOOD GAS (G3+)
Acid-base deficit: 4 mmol/L — ABNORMAL HIGH (ref 0.0–2.0)
BICARBONATE: 21.2 meq/L (ref 20.0–24.0)
O2 Saturation: 99 %
PCO2 ART: 37.4 mmHg (ref 35.0–45.0)
PH ART: 7.362 (ref 7.350–7.450)
TCO2: 22 mmol/L (ref 0–100)
pO2, Arterial: 171 mmHg — ABNORMAL HIGH (ref 80.0–100.0)

## 2013-04-27 LAB — LACTIC ACID, PLASMA: Lactic Acid, Venous: 2 mmol/L (ref 0.5–2.2)

## 2013-04-27 LAB — MAGNESIUM: MAGNESIUM: 2.6 mg/dL — AB (ref 1.5–2.5)

## 2013-04-27 MED ORDER — HYDROCORTISONE NA SUCCINATE PF 100 MG IJ SOLR
25.0000 mg | Freq: Four times a day (QID) | INTRAMUSCULAR | Status: DC
  Filled 2013-04-27 (×3): qty 0.5

## 2013-04-27 MED ORDER — METOPROLOL TARTRATE 1 MG/ML IV SOLN
5.0000 mg | Freq: Four times a day (QID) | INTRAVENOUS | Status: DC
  Administered 2013-04-27: 5 mg via INTRAVENOUS
  Filled 2013-04-27: qty 5

## 2013-04-27 MED ORDER — PHENYLEPHRINE HCL 10 MG/ML IJ SOLN
30.0000 ug/min | INTRAVENOUS | Status: DC
  Filled 2013-04-27: qty 1

## 2013-04-27 MED ORDER — INSULIN GLARGINE 100 UNIT/ML ~~LOC~~ SOLN
30.0000 [IU] | SUBCUTANEOUS | Status: DC
  Administered 2013-04-27: 30 [IU] via SUBCUTANEOUS
  Filled 2013-04-27 (×2): qty 0.3

## 2013-04-27 MED ORDER — DEXTROSE 10 % IV SOLN: INTRAVENOUS | Status: DC | PRN

## 2013-04-27 MED ORDER — HYDROCORTISONE NA SUCCINATE PF 100 MG IJ SOLR
50.0000 mg | Freq: Four times a day (QID) | INTRAMUSCULAR | Status: DC
  Administered 2013-04-27 – 2013-04-28 (×3): 50 mg via INTRAVENOUS
  Filled 2013-04-27 (×7): qty 1

## 2013-04-27 MED ORDER — INSULIN ASPART 100 UNIT/ML ~~LOC~~ SOLN
2.0000 [IU] | SUBCUTANEOUS | Status: DC
Start: 2013-04-27 — End: 2013-04-28
  Administered 2013-04-28: 2 [IU] via SUBCUTANEOUS
  Administered 2013-04-28: 4 [IU] via SUBCUTANEOUS

## 2013-04-27 MED ORDER — BIOTENE DRY MOUTH MT LIQD
15.0000 mL | Freq: Four times a day (QID) | OROMUCOSAL | Status: DC
  Administered 2013-04-27 – 2013-05-05 (×25): 15 mL via OROMUCOSAL

## 2013-04-27 MED ORDER — POTASSIUM CHLORIDE 20 MEQ/15ML (10%) PO LIQD
20.0000 meq | ORAL | Status: AC
  Administered 2013-04-27 (×2): 20 meq
  Filled 2013-04-27 (×2): qty 15

## 2013-04-27 MED ORDER — FENTANYL CITRATE 0.05 MG/ML IJ SOLN
12.5000 ug | INTRAMUSCULAR | Status: DC | PRN
  Administered 2013-04-27 – 2013-04-28 (×4): 100 ug via INTRAVENOUS
  Administered 2013-04-29: 12.5 ug via INTRAVENOUS
  Filled 2013-04-27 (×5): qty 2

## 2013-04-27 MED ORDER — CARVEDILOL 25 MG PO TABS: 25.0000 mg | ORAL_TABLET | Freq: Two times a day (BID) | ORAL | Status: DC

## 2013-04-27 MED ORDER — PHENYLEPHRINE HCL 10 MG/ML IJ SOLN
30.0000 ug/min | INTRAVENOUS | Status: DC
  Filled 2013-04-27: qty 4

## 2013-04-27 MED ORDER — CARVEDILOL 25 MG PO TABS
25.0000 mg | ORAL_TABLET | Freq: Two times a day (BID) | ORAL | Status: DC
  Administered 2013-04-27 – 2013-04-28 (×2): 25 mg
  Filled 2013-04-27 (×4): qty 1

## 2013-04-27 MED ORDER — METOPROLOL TARTRATE 1 MG/ML IV SOLN: 5.0000 mg | Freq: Four times a day (QID) | INTRAVENOUS | Status: DC

## 2013-04-27 NOTE — Progress Notes (Addendum)
[facility]  Subjective  - Intubated, alert responds to verbal questions with head nods.   Objective 123/92 46 97.8 F (36.6 C) (Oral) 16 100%  Intake/Output Summary (Last 24 hours) at 04/27/13 0738 Last data filed at 04/27/13 0700  Gross per 24 hour  Intake 5576.64 ml  Output   1205 ml  Net 4371.64 ml    Right groin inspected.  Bead has clean edges and beef bases. Wet to dry kerlex with abd was applied. Distal DP doppler signal.  Skin warm to touch  Assessment/Planning: POD #1 Groin exploration and debridement and removal of bovine pericardial patch and placement of vein patch, saphenous vein  Pending intraoperative cultures BID wet to dry dressing changes.   [facility] 04/27/2013 7:38 AM --  Laboratory Lab Results:  Recent Labs  04/26/13 0345  04/26/13 1451 04/27/13 0440  WBC 15.7*  --   --  8.1  HGB 9.7*  < > 9.2* 8.7*  HCT 30.2*  < > 27.0* 26.6*  PLT 173  --   --  100*  < > = values in this interval not displayed. BMET  Recent Labs  04/26/13 1730 04/27/13 0440  NA 136* 139  K 4.3 3.6*  CL 103 106  CO2 18* 19  GLUCOSE 229* 139*  BUN 25* 25*  CREATININE 1.25 1.02  CALCIUM 7.3* 7.5*    COAG Lab Results  Component Value Date   INR 1.58* 04/26/2013   INR 1.84* 03/25/2013   INR 2.48* 03/24/2013   No results found for this basename: PTT      I agree with the above WBC better, afebrile Hopefully will be able to extubate today Family at bedside and updated Wet to dry dressings BID   Wells Brabham

## 2013-04-27 NOTE — Anesthesia Postprocedure Evaluation (Signed)
  Anesthesia Post-op Note  Patient: [name] [name]  Procedure(s) Performed: Procedure(s): IRRIGATION AND DEBRIDEMENT right groin, removal of prostectic patch (Right) Vein PATCH ANGIOPLASTY of common femoral and SFA (Right)  Patient Location: ICU  Anesthesia Type:General  Level of Consciousness: alert, follows commands, patient cooperative and Patient remains intubated per anesthesia plan  Airway and Oxygen Therapy: Patient remains intubated per anesthesia plan  Post-op Pain: none  Post-op Assessment: Post-op Vital signs reviewed, Patient's Cardiovascular Status Stable, Respiratory Function Stable, No signs of Nausea or vomiting and Pain level controlled  Post-op Vital Signs: Reviewed and stable  Complications: No apparent anesthesia complications

## 2013-04-27 NOTE — Progress Notes (Signed)
PULMONARY/CRITICAL CARE  Name: [name] [name] MRN: [id number] DOB: 08-May-1950    ADMISSION DATE:  04/25/2013 CONSULTATION DATE:  04/25/13  REFERRING MD :  ED PRIMARY SERVICE: PCCM  CHIEF COMPLAINT:  Shaking chills, weakness   BRIEF PATIENT DESCRIPTION: [name] is a 63 y.o. male w/ PMHx of CAD s/p CABG, CHF w/ AICD/PPM (04/06/12), A-fib (on Coumadin) PAD s/p R. Femoral endarterectomy and R/ Iliac stent placement,  CKD, HTN, HLD, DM type II, transferred from [facility] ED w/ likely septic shock 2/2 bacteremia and acute respiratory failure requiring BiPAP.   SIGNIFICANT EVENTS / STUDIES:  1/27 - Transferred from [facility] ED w/ hypotension 1/27 - L. Femoral line placed, started on Levophed  LINES / TUBES: L. Femoral CVL 1/27 >>>1/28 RIJ 1/28 >>>> OETT 1/28 >>>  CULTURES: Blood 1/27 >>>canceled R. Femoral wound 1/27 >>> Moderate Staph Aureus R femoral wound 1/28 >>> 1/2 R G+ cocci in pairs R femoral wound drainage 1/28 >>> Moderate stap aureus  ANTIBIOTICS: Vancomycin 1/27 >>> Cefepime 1/27 >>> Flagyl 1/28 >>>   HISTORY OF PRESENT ILLNESS:  [name] is a 63 y.o. male w/ PMHx of CAD s/p CABG, CHF w/ AICD/PPM (04/06/12), A-fib (on Coumadin) PAD s/p R. Femoral endarterectomy and R/ Iliac stent placement,  CKD, HTN, HLD, DM type II, transferred from [facility] ED after having recent severe weakness, and shaking chills at home for the past few days. In ED, found to be desatting, placed on BiPAP. Hypotensive on arrival to [facility], R. Femoral line placed, Levophed started. Patient likely w/ septic shock 2/2 bacteremia.   SUBJECTIVE:  Tolerated surgery well. Sedated but somewhat interactive. No further episodes of diarrhea  VITAL SIGNS: Temp:  [97.7 F (36.5 C)-99.4 F (37.4 C)] 97.8 F (36.6 C) (01/29 0415) Pulse Rate:  [31-125] 46 (01/29 0715) Resp:  [13-37] 16 (01/29 0715) BP: (66-182)/(41-92) 123/92 mmHg (01/29 0700) SpO2:  [99 %-100 %] 100 %  (01/29 0715) Arterial Line BP: (86-159)/(47-76) 107/47 mmHg (01/28 1245) FiO2 (%):  [40 %-50 %] 40 % (01/29 0700) Weight:  [215 lb 2.7 oz (97.6 kg)] 215 lb 2.7 oz (97.6 kg) (01/29 0600)  HEMODYNAMICS: CVP:  [12 mmHg] 12 mmHg VENTILATOR SETTINGS: Vent Mode:  [-] PRVC FiO2 (%):  [40 %-50 %] 40 % Set Rate:  [16 bmp] 16 bmp Vt Set:  [560 mL] 560 mL PEEP:  [5 cmH20] 5 cmH20 Pressure Support:  [5 cmH20] 5 cmH20 Plateau Pressure:  [18 cmH20-21 cmH20] 18 cmH20 INTAKE / OUTPUT: Intake/Output     01/28 0701 - 01/29 0700 01/29 0701 - 01/30 0700   I.V. (mL/kg) 4746.6 (48.6)    Blood     NG/GT 30    IV Piggyback 800    Total Intake(mL/kg) 5576.6 (57.1)    Urine (mL/kg/hr) 955 (0.4)    Blood 250 (0.1)    Total Output 1205     Net +4371.6          Stool Occurrence 1 x      PHYSICAL EXAMINATION: General:  Sedated ETT in palce Neuro:   RASS -1, Follows commands HEENT:  [place]/AT OETT in place Cardiovascular:  RRR. No murmurs, gallops, rubs.  Lungs:  Mechanical breath sounds Abdomen:  Soft, non-tender, non-distended. BS +. R. Femoral wound bandage c/d/i.  Musculoskeletal:  No LE edema.  Skin:  Multiple small ulcers present on upper and lower extremities. No erythema, or rash.   LABS:  CBC  Recent Labs Lab 04/26/13 0345 04/26/13 1443 04/26/13  1451 04/27/13 0440  WBC 15.7*  --   --  8.1  HGB 9.7* 8.2* 9.2* 8.7*  HCT 30.2* 24.0* 27.0* 26.6*  PLT 173  --   --  100*   Coag's  Recent Labs Lab 04/26/13 0645  INR 1.58*   BMET  Recent Labs Lab 04/26/13 0930  04/26/13 1451 04/26/13 1730 04/27/13 0440  NA 135*  < > 136* 136* 139  K 5.0  < > 3.8 4.3 3.6*  CL 98  --   --  103 106  CO2 16*  --   --  18* 19  BUN 25*  --   --  25* 25*  CREATININE 1.51*  --   --  1.25 1.02  GLUCOSE 429*  --  284* 229* 139*  < > = values in this interval not displayed. Electrolytes  Recent Labs Lab 04/26/13 0345 04/26/13 0930 04/26/13 1730 04/27/13 0440  CALCIUM 7.5* 7.8* 7.3* 7.5*  MG  1.3*  --   --   --    Sepsis Markers  Recent Labs Lab 04/25/13 2210 04/26/13 0645 04/26/13 1730  LATICACIDVEN 4.57* 3.6* 3.2*   ABG  Recent Labs Lab 04/25/13 2151 04/26/13 1201 04/26/13 1443  PHART 7.349* 7.305* 7.271*  PCO2ART 36.6 39.8 43.4  PO2ART 134.0* 198.0* 373.0*   Liver Enzymes  Recent Labs Lab 04/26/13 0345 04/26/13 0930  AST 59* 80*  ALT 28 38  ALKPHOS 72 86  BILITOT 1.1 1.7*  ALBUMIN 2.4* 2.6*    Cardiac Enzymes  Recent Labs Lab 04/25/13 2205 04/26/13 0645 04/26/13 0943 04/26/13 1730  TROPONINI  --  0.41* 0.45* 0.53*  PROBNP 8808.0*  --   --   --    Glucose  Recent Labs Lab 04/27/13 0030 04/27/13 0133 04/27/13 0226 04/27/13 0329 04/27/13 0432 04/27/13 0555  GLUCAP 161* 157* 145* 147* 137* 130*    CXR 1/29: mild edema likely , ett will adjust out 2 cm  ASSESSMENT / PLAN:  PULMONARY A: Acute respiratory failure Pulmonary edema Does not want intubation for prolonged peorid P:   Goal SpO2 >92% Wean as tolerated, cpap5 ps 5, goal 1 hr, with NO option re intubation Even goals balance  pcxr in am  Adjust ETT out 2 cm abg loast reviewed, likley improved MV has improved ph  CARDIOVASCULAR A:  septic shock Recent R. Femoral endarterectomy w/ iliac stent. Poor wound healing. H/o CAD s/p CABG H/o A-fib, on Coumadin.  Chronic RBBB CHF Ef 20-25% diffuse hypokinesis, PA peak P:  Pressors off Solu-Cortef 50 mg IV q6h (on prednisone 5 bid at home). vascular surgery following D/C NS @ 125 ml/hr start d5 1/2ns kvo  Check lactic acid for clarence improved Trop noted, check for trend down Hold Coreg, Lasix, Hydralazine, Losartan, Imdur, ASA, digoxin, Coumadin cvp 12 noted, to kvo Will need to start diuresis as BP tolerates, likely in am with EF  RENAL A:   Hypomagnesemia Acute Renal Failure, ATN Mild hyponatremia Lactic acidosis Stay even as goal  P:   Decrease D5 1/2NS to kvo Mag repeat bmet in pm and am May need  lasix  GASTROINTESTINAL A:   H/o GERD On home PPI Nutrition Diarhea, r/o cidf, at risk ischemia P:   Start TF if not extubated  Protonix Assess cdiff pending, unlikely as no stool to send  HEMATOLOGIC A:   A-fib on Coumadin No Leukocytosis DVT/PE PPx S/P 4 units FFP Thrombocytopenia, likely dilution, sepsis P: .  Trend PT/INR CBC in AM SCD's  for now Start heparin after surgery in days after d/w vascular kvo  INFECTIOUS A:   septic shock 2/2 possible bacteremia s/p endarterectomy,r/o cdiff P:   Abx; Vancomycin + Cefepime + flagyl  For anearobic-remain Monitor R. Femoral wound Send cdiff if diarrhea noted  ENDOCRINE A:   H/o DM type II On chronic steroids at home, adrenal insuff Hyperglycemia P:   Solu-Cortef as above CBG's q4h insulin drip transition off as goal  NEUROLOGIC A:   Vent dyschrony P:   Continue to monitor fent prn Gershon Mussel, DO 04/27/2013 7:22 AM  Ccm time 30 min   I have fully examined this patient and agree with above findings.    And edited in full  [name]. [name], MD, FACP Pgr: [phone] [place] [facility]

## 2013-04-27 NOTE — Progress Notes (Signed)
[facility] ADULT ICU REPLACEMENT PROTOCOL FOR AM LAB REPLACEMENT ONLY  The patient does apply for the [facility] Adult ICU Electrolyte Replacment Protocol based on the criteria listed below:   1. Is GFR >/= 40 ml/min? yes  Patient's GFR today is 78 2. Is urine output >/= 0.5 ml/kg/hr for the last 6 hours? yes Patient's UOP is 0.06 ml/kg/hr 3. Is BUN < 60 mg/dL? yes  Patient's BUN today is 25 4. Abnormal electrolyte(s): K 3.6 5. Ordered repletion with: per protocol 6. If a panic level lab has been reported, has the CCM MD in charge been notified? no.   Physician:    Markus Daft A 04/27/2013 6:27 AM

## 2013-04-28 ENCOUNTER — Inpatient Hospital Stay (HOSPITAL_COMMUNITY): Payer: Medicare Other

## 2013-04-28 DIAGNOSIS — I4892 Unspecified atrial flutter: Secondary | ICD-10-CM

## 2013-04-28 LAB — POCT I-STAT 3, ART BLOOD GAS (G3+)
Acid-base deficit: 4 mmol/L — ABNORMAL HIGH (ref 0.0–2.0)
Bicarbonate: 20.3 mEq/L (ref 20.0–24.0)
O2 Saturation: 100 %
PO2 ART: 191 mmHg — AB (ref 80.0–100.0)
TCO2: 21 mmol/L (ref 0–100)
pCO2 arterial: 33.5 mmHg — ABNORMAL LOW (ref 35.0–45.0)
pH, Arterial: 7.389 (ref 7.350–7.450)

## 2013-04-28 LAB — WOUND CULTURE
Gram Stain: NONE SEEN
Gram Stain: NONE SEEN
SPECIAL REQUESTS: NORMAL
Special Requests: NORMAL

## 2013-04-28 LAB — BASIC METABOLIC PANEL
BUN: 23 mg/dL (ref 6–23)
CO2: 19 mEq/L (ref 19–32)
CREATININE: 0.88 mg/dL (ref 0.50–1.35)
Calcium: 8 mg/dL — ABNORMAL LOW (ref 8.4–10.5)
Chloride: 105 mEq/L (ref 96–112)
GFR calc Af Amer: >90 mL/min (ref >90)
Glucose, Bld: 198 mg/dL — ABNORMAL HIGH (ref 70–99)
Potassium: 4.9 mEq/L (ref 3.7–5.3)
Sodium: 139 mEq/L (ref 137–147)

## 2013-04-28 LAB — GLUCOSE, CAPILLARY
GLUCOSE-CAPILLARY: 148 mg/dL — AB (ref 70–99)
GLUCOSE-CAPILLARY: 176 mg/dL — AB (ref 70–99)
Glucose-Capillary: 260 mg/dL — ABNORMAL HIGH (ref 70–99)
Glucose-Capillary: 265 mg/dL — ABNORMAL HIGH (ref 70–99)
Glucose-Capillary: 259 mg/dL — ABNORMAL HIGH (ref 70–99)
Glucose-Capillary: 215 mg/dL — ABNORMAL HIGH (ref 70–99)

## 2013-04-28 LAB — CBC
HCT: 27.1 % — ABNORMAL LOW (ref 39.0–52.0)
Hemoglobin: 8.9 g/dL — ABNORMAL LOW (ref 13.0–17.0)
MCH: 27.4 pg (ref 26.0–34.0)
MCHC: 32.8 g/dL (ref 30.0–36.0)
MCV: 83.4 fL (ref 78.0–100.0)
Platelets: 93 10*3/uL — ABNORMAL LOW (ref 150–400)
RBC: 3.25 MIL/uL — ABNORMAL LOW (ref 4.22–5.81)
RDW: 19 % — ABNORMAL HIGH (ref 11.5–15.5)
WBC: 8.5 10*3/uL (ref 4.0–10.5)

## 2013-04-28 LAB — DIGOXIN LEVEL: Digoxin Level: <0.3 ng/mL — ABNORMAL LOW (ref 0.8–2.0)

## 2013-04-28 LAB — CLOSTRIDIUM DIFFICILE BY PCR: CDIFFPCR: NEGATIVE

## 2013-04-28 MED ORDER — FUROSEMIDE 10 MG/ML IJ SOLN
40.0000 mg | Freq: Two times a day (BID) | INTRAMUSCULAR | Status: DC
  Administered 2013-04-28 – 2013-04-29 (×2): 40 mg via INTRAVENOUS
  Filled 2013-04-28 (×4): qty 4

## 2013-04-28 MED ORDER — INSULIN ASPART 100 UNIT/ML ~~LOC~~ SOLN: 10.0000 [IU] | Freq: Once | SUBCUTANEOUS | Status: DC

## 2013-04-28 MED ORDER — DIGOXIN 125 MCG PO TABS
0.1250 mg | ORAL_TABLET | Freq: Every day | ORAL | Status: DC
  Filled 2013-04-28: qty 1

## 2013-04-28 MED ORDER — METOPROLOL TARTRATE 1 MG/ML IV SOLN
5.0000 mg | Freq: Four times a day (QID) | INTRAVENOUS | Status: DC
  Administered 2013-04-28 – 2013-04-29 (×2): 5 mg via INTRAVENOUS
  Filled 2013-04-28 (×6): qty 5

## 2013-04-28 MED ORDER — METOPROLOL TARTRATE 1 MG/ML IV SOLN: 10.0000 mg | Freq: Four times a day (QID) | INTRAVENOUS | Status: DC

## 2013-04-28 MED ORDER — SODIUM CHLORIDE 0.9 % IV SOLN: INTRAVENOUS | Status: DC

## 2013-04-28 MED ORDER — INSULIN REGULAR BOLUS VIA INFUSION
10.0000 [IU] | Freq: Once | INTRAVENOUS | Status: DC
Start: 2013-04-28 — End: 2013-04-28

## 2013-04-28 MED ORDER — LOSARTAN POTASSIUM 50 MG PO TABS
100.0000 mg | ORAL_TABLET | Freq: Every day | ORAL | Status: DC
  Filled 2013-04-28: qty 2

## 2013-04-28 MED ORDER — INSULIN REGULAR BOLUS VIA INFUSION: 10.0000 [IU] | Freq: Once | INTRAVENOUS | Status: DC

## 2013-04-28 MED ORDER — DIGOXIN 0.25 MG/ML IJ SOLN
0.1250 mg | Freq: Every day | INTRAMUSCULAR | Status: DC
  Administered 2013-04-28: 0.125 mg via INTRAVENOUS
  Filled 2013-04-28 (×3): qty 0.5

## 2013-04-28 MED ORDER — VANCOMYCIN HCL IN DEXTROSE 1-5 GM/200ML-% IV SOLN
1000.0000 mg | Freq: Three times a day (TID) | INTRAVENOUS | Status: DC
  Administered 2013-04-28 – 2013-04-30 (×7): 1000 mg via INTRAVENOUS
  Filled 2013-04-28 (×9): qty 200

## 2013-04-28 MED ORDER — HYDROCORTISONE NA SUCCINATE PF 100 MG IJ SOLR
25.0000 mg | Freq: Four times a day (QID) | INTRAMUSCULAR | Status: DC
  Administered 2013-04-28 – 2013-04-29 (×4): 25 mg via INTRAVENOUS
  Filled 2013-04-28 (×8): qty 0.5

## 2013-04-28 MED ORDER — INSULIN ASPART 100 UNIT/ML ~~LOC~~ SOLN
10.0000 [IU] | Freq: Once | SUBCUTANEOUS | Status: AC
  Administered 2013-04-28: 10 [IU] via SUBCUTANEOUS

## 2013-04-28 MED ORDER — SODIUM CHLORIDE 0.9 % IV SOLN
1.5000 g | Freq: Four times a day (QID) | INTRAVENOUS | Status: DC
  Administered 2013-04-28 – 2013-04-30 (×9): 1.5 g via INTRAVENOUS
  Filled 2013-04-28 (×13): qty 1.5

## 2013-04-28 MED ORDER — INSULIN ASPART 100 UNIT/ML ~~LOC~~ SOLN
2.0000 [IU] | SUBCUTANEOUS | Status: DC
Start: 2013-04-28 — End: 2013-04-28
  Administered 2013-04-28: 2 [IU] via SUBCUTANEOUS

## 2013-04-28 MED ORDER — HYDROCODONE-ACETAMINOPHEN 7.5-325 MG PO TABS
1.0000 | ORAL_TABLET | Freq: Once | ORAL | Status: AC
  Administered 2013-04-29: 1 via ORAL
  Filled 2013-04-28: qty 1

## 2013-04-28 MED ORDER — INSULIN ASPART 100 UNIT/ML ~~LOC~~ SOLN
2.0000 [IU] | SUBCUTANEOUS | Status: DC
  Administered 2013-04-28: 6 [IU] via SUBCUTANEOUS

## 2013-04-28 MED ORDER — INSULIN ASPART 100 UNIT/ML ~~LOC~~ SOLN
0.0000 [IU] | SUBCUTANEOUS | Status: DC
Start: 2013-04-28 — End: 2013-04-30
  Administered 2013-04-28: 11 [IU] via SUBCUTANEOUS
  Administered 2013-04-28: 7 [IU] via SUBCUTANEOUS
  Administered 2013-04-29 – 2013-04-30 (×6): 4 [IU] via SUBCUTANEOUS
  Administered 2013-04-30: 7 [IU] via SUBCUTANEOUS
  Administered 2013-04-30: 3 [IU] via SUBCUTANEOUS
  Administered 2013-04-30: 4 [IU] via SUBCUTANEOUS

## 2013-04-28 MED ORDER — METOPROLOL TARTRATE 1 MG/ML IV SOLN
5.0000 mg | Freq: Once | INTRAVENOUS | Status: AC
  Administered 2013-04-28: 5 mg via INTRAVENOUS

## 2013-04-28 MED ORDER — INSULIN GLARGINE 100 UNIT/ML ~~LOC~~ SOLN
40.0000 [IU] | SUBCUTANEOUS | Status: DC
  Administered 2013-04-28 – 2013-04-29 (×2): 40 [IU] via SUBCUTANEOUS
  Filled 2013-04-28 (×5): qty 0.4

## 2013-04-28 MED ORDER — METOPROLOL TARTRATE 1 MG/ML IV SOLN
INTRAVENOUS | Status: AC
  Filled 2013-04-28: qty 5

## 2013-04-28 MED ORDER — METOPROLOL TARTRATE 1 MG/ML IV SOLN: 15.0000 mg | Freq: Four times a day (QID) | INTRAVENOUS | Status: DC

## 2013-04-28 NOTE — Progress Notes (Addendum)
ANTIBIOTIC CONSULT NOTE - INITIAL  Pharmacy Consult for Unasyn and Vancomycin Indication: staph aureus in wound  Allergies  Allergen Reactions  . Lipitor [Atorvastatin] Other (See Comments)    Weak muscles  . Lodine [Etodolac] Nausea And Vomiting  . Methotrexate Derivatives Nausea And Vomiting  . Remicade [Infliximab] Other (See Comments)    Chills and shakes   . Ranitidine Hcl Nausea Only    Patient Measurements: Height: 5' 8.9" (175 cm) Weight: 215 lb 6.2 oz (97.7 kg) IBW/kg (Calculated) : 70.47  Vital Signs: Temp: 98.4 F (36.9 C) (01/30 0757) Temp src: Oral (01/30 0757) BP: 138/91 mmHg (01/30 0900) Pulse Rate: 124 (01/30 0900) Intake/Output from previous day: 01/29 0701 - 01/30 0700 In: 1398.4 [I.V.:573.4; NG/GT:125; IV Piggyback:700] Out: 1105 [Urine:1105] Intake/Output from this shift: Total I/O In: 40 [I.V.:40] Out: -   Labs:  Recent Labs  04/26/13 0345  04/26/13 1451  04/27/13 0440 04/27/13 1800 04/28/13 0425  WBC 15.7*  --   --   --  8.1  --  8.5  HGB 9.7*  < > 9.2*  --  8.7*  --  8.9*  PLT 173  --   --   --  100*  --  93*  CREATININE 1.74*  < >  --   < > 1.02 0.82 0.88  < > = values in this interval not displayed. Estimated Creatinine Clearance: 102.8 ml/min (by C-G formula based on Cr of 0.88). No results found for this basename: VANCOTROUGH, Leodis Binet, VANCORANDOM, GENTTROUGH, GENTPEAK, GENTRANDOM, TOBRATROUGH, TOBRAPEAK, TOBRARND, AMIKACINPEAK, AMIKACINTROU, AMIKACIN,  in the last 72 hours   Microbiology: Recent Results (from the past 720 hour(s))  WOUND CULTURE     Status: None   Collection Time    04/25/13 10:10 PM      Result Value Range Status   Specimen Description WOUND GROIN RIGHT   Final   Special Requests Normal   Final   Gram Stain     Final   Value: NO WBC SEEN     NO SQUAMOUS EPITHELIAL CELLS SEEN     NO ORGANISMS SEEN     Performed at [facility]   Culture     Final   Value: MODERATE STAPHYLOCOCCUS AUREUS     Note:  RIFAMPIN AND GENTAMICIN SHOULD NOT BE USED AS SINGLE DRUGS FOR TREATMENT OF STAPH INFECTIONS.     Performed at [facility]   Report Status PENDING   Incomplete  WOUND CULTURE     Status: None   Collection Time    04/26/13 12:05 AM      Result Value Range Status   Specimen Description WOUND RIGHT GROIN DRAINAGE   Final   Special Requests Normal   Final   Gram Stain     Final   Value: NO WBC SEEN     NO SQUAMOUS EPITHELIAL CELLS SEEN     NO ORGANISMS SEEN     Performed at [facility]   Culture     Final   Value: MODERATE STAPHYLOCOCCUS AUREUS     Note: RIFAMPIN AND GENTAMICIN SHOULD NOT BE USED AS SINGLE DRUGS FOR TREATMENT OF STAPH INFECTIONS.     Performed at [facility]   Report Status PENDING   Incomplete  MRSA PCR SCREENING     Status: None   Collection Time    04/26/13  1:48 AM      Result Value Range Status   MRSA by PCR NEGATIVE  NEGATIVE Final  Comment:            The GeneXpert MRSA Assay (FDA     approved for NASAL specimens     only), is one component of a     comprehensive MRSA colonization     surveillance program. It is not     intended to diagnose MRSA     infection nor to guide or     monitor treatment for     MRSA infections.  WOUND CULTURE     Status: None   Collection Time    04/26/13  1:43 PM      Result Value Range Status   Specimen Description WOUND RIGHT GROIN   Final   Special Requests SPECIMEN NO.1 PT ON FLAGYL,VANCOMYCIN,MAXIPINE   Final   Gram Stain     Final   Value: NO WBC SEEN     NO SQUAMOUS EPITHELIAL CELLS SEEN     NO ORGANISMS SEEN     Performed at [facility]   Culture     Final   Value: FEW STAPHYLOCOCCUS AUREUS     Note: RIFAMPIN AND GENTAMICIN SHOULD NOT BE USED AS SINGLE DRUGS FOR TREATMENT OF STAPH INFECTIONS.     Performed at [facility]   Report Status PENDING   Incomplete  WOUND CULTURE     Status: None   Collection Time    04/26/13  1:46 PM      Result Value Range Status    Specimen Description WOUND RIGHT GROIN   Final   Special Requests SPECIMEN NO. 2 PT ON FLAGYL,VANCOMYCIN,MAXIPINE   Final   Gram Stain     Final   Value: RARE WBC PRESENT,BOTH PMN AND MONONUCLEAR     NO SQUAMOUS EPITHELIAL CELLS SEEN     RARE GRAM POSITIVE COCCI IN PAIRS     Performed at [facility]   Culture     Final   Value: MODERATE STAPHYLOCOCCUS AUREUS     Note: RIFAMPIN AND GENTAMICIN SHOULD NOT BE USED AS SINGLE DRUGS FOR TREATMENT OF STAPH INFECTIONS.     Performed at [facility]   Report Status PENDING   Incomplete    Medical History: Past Medical History  Diagnosis Date  . Diabetes mellitus   . Hypertension   . Hyperlipidemia   . Rheumatoid arthritis   . CAD (coronary artery disease)   . Myocardial infarction 2000 & 2006  . GERD (gastroesophageal reflux disease)   . Leg pain   . CHF (congestive heart failure)   . Chronic kidney disease   . PAD (peripheral artery disease)   . Decubitus ulcer of right ankle   . 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 - single chamber ICD  . Pacemaker    Assessment: 12 YOM with staph aureus in his wound culture, to narrow antibiotics to Unasyn and Vancomycin. Renal function has improved since antibiotics have been started; currently SCr 0.8 and CrCL >152mL/min. Awaiting final culture report to narrow further. WBC wnl, currently afebrile.  Goal of Therapy:  Vancomycin trough level 15-20 mcg/ml  Plan:  1. Change vancomycin to 1000mg  IV q8h (last dose received at 0600 this morning from q24h dosing) 2. Unasyn 1.5gm IV q6h 3. Will get vancomycin trough at new steady state 4. Follow renal function, LOT, c/s  Rossi Burdo D. Robie Mcniel, PharmD, BCPS Clinical Pharmacist Pager: [phone] 04/28/2013 10:15 AM

## 2013-04-28 NOTE — Progress Notes (Addendum)
[facility]  Subjective  - extubated responded to coversation and pain of removing the dressing.   Objective 152/117 122 98.4 F (36.9 C) (Oral) 13 100%  Intake/Output Summary (Last 24 hours) at 04/28/13 1120 Last data filed at 04/28/13 1000  Gross per 24 hour  Intake 1234.8 ml  Output    920 ml  Net  314.8 ml    Wound with beefy red base. No active bleeding  Assessment/Planning: POD #2 Groin exploration and debridement and removal of bovine pericardial patch and placement of vein patch, saphenous vein  Pending intraoperative cultures final/ Moderate Staph Aureus BID wet to dry dressing changes.    Clinton Gallant [facility] 04/28/2013 11:20 AM --  Laboratory Lab Results:  Recent Labs  04/27/13 0440 04/28/13 0425  WBC 8.1 8.5  HGB 8.7* 8.9*  HCT 26.6* 27.1*  PLT 100* 93*   BMET  Recent Labs  04/27/13 1800 04/28/13 0425  NA 137 139  K 4.3 4.9  CL 105 105  CO2 19 19  GLUCOSE 99 198*  BUN 18 23  CREATININE 0.82 0.88  CALCIUM 7.8* 8.0*    COAG Lab Results  Component Value Date   INR 1.58* 04/26/2013   INR 1.84* 03/25/2013   INR 2.48* 03/24/2013   No results found for this basename: PTT      I have examined the patient, reviewed and agree with above.no purulence in the right groin. Continued twice a day wet to dry saline dressing. Discussed with Dr. [name]. Patient is adamant that he does not want reintubation should his pulmonary status progress.  Seraphina Mitchner, MD 04/28/2013 1:22 PM

## 2013-04-28 NOTE — Progress Notes (Signed)
NUTRITION FOLLOW UP  Intervention:    Diet advancement as appropriate per MD and/or SLP.  Nutrition Dx:   Inadequate oral intake related to inability to eat as evidenced by NPO status. Ongoing.  Goal:   Intake to meet >90% of estimated nutrition needs.  Monitor:   Diet advancement, PO intake, labs, weight trend.  Assessment:   Patient is a 63 y.o. male w/ PMHx of CAD s/p CABG, CHF w/ AICD/PPM (04/06/12), A-fib (on Coumadin) PAD s/p R. Femoral endarterectomy and R/ Iliac stent placement, CKD, HTN, HLD, DM type II, transferred from [facility] ED on 1/27 with likely septic shock 2/2 bacteremia and acute respiratory failure requiring BiPAP. Required intubation on 1/28.  Patient was extubated earlier today. Code status was changed to DNR after physicians meeting with family today. Plans are not to re-intubate, but to make comfortable if he develops severe respiratory distress.  Patient remains NPO for now. Per RN, he was not able to swallow for her earlier this afternoon.  Height: Ht Readings from Last 1 Encounters:  04/26/13 5' 8.9" (1.75 m)    Weight Status:   Wt Readings from Last 1 Encounters:  04/28/13 215 lb 6.2 oz (97.7 kg)  04/26/13  206 lb 12.7 oz (93.8 kg)  Re-estimated needs:  Kcal: 2000-2200 Protein: 110-125 gm Fluid: 2-2.2 L  Skin: right groin abscess, right ankle wound  Diet Order: NPO   Intake/Output Summary (Last 24 hours) at 04/28/13 1517 Last data filed at 04/28/13 1230  Gross per 24 hour  Intake 1085.36 ml  Output    565 ml  Net 520.36 ml    Last BM: 1/28   Labs:   Recent Labs Lab 04/26/13 0345  04/27/13 0440 04/27/13 1800 04/28/13 0425  NA 134*  < > 139 137 139  K 5.0  < > 3.6* 4.3 4.9  CL 98  < > 106 105 105  CO2 17*  < > 19 19 19   BUN 22  < > 25* 18 23  CREATININE 1.74*  < > 1.02 0.82 0.88  CALCIUM 7.5*  < > 7.5* 7.8* 8.0*  MG 1.3*  --  2.6*  --   --   GLUCOSE 388*  < > 139* 99 198*  < > = values in this interval not  displayed.  CBG (last 3)   Recent Labs  04/28/13 0401 04/28/13 0755 04/28/13 1154  GLUCAP 176* 260* 265*    Scheduled Meds: . ampicillin-sulbactam (UNASYN) IV  1.5 g Intravenous Q6H  . antiseptic oral rinse  15 mL Mouth Rinse QID  . chlorhexidine  15 mL Mouth Rinse BID  . digoxin  0.125 mg Intravenous Daily  . furosemide  40 mg Intravenous BID  . hydrocortisone sod succinate (SOLU-CORTEF) inj  25 mg Intravenous Q6H  . insulin aspart  2-6 Units Subcutaneous Q4H  . insulin glargine  40 Units Subcutaneous Q24H  . metoprolol  5 mg Intravenous Q6H  . pantoprazole (PROTONIX) IV  40 mg Intravenous QHS  . vancomycin  1,000 mg Intravenous Q8H    Continuous Infusions: . dextrose 5 % and 0.45% NaCl 20 mL/hr at 04/27/13 1726  . phenylephrine (NEO-SYNEPHRINE) Adult infusion Stopped (04/27/13 1900)  . vasopressin (PITRESSIN) infusion - *FOR SHOCK* Stopped (04/27/13 0430)     04/29/13, RD, LDN, CNSC Pager [phone] After Hours Pager [phone]

## 2013-04-28 NOTE — Progress Notes (Signed)
Goals of Care  Had family discussion with Wife and children at patients bedside. Explained that patient had a lot of comorbid medical conditions but was doing very well and would be extubated.  It was agreed that per [name] [name]'s wishes he would not be re intubated but would be made comfortable if he was in severe respiratory distress.  They did not wish for code blue if the event that his heart would stop beating or he became unresponsive.  His code status was changed to DNR.  Carlynn Purl, DO PGY-1 Internal Medicine Resident  As above [name] [name]. [name], MD, FACP Pgr: [phone] [facility] [facility]

## 2013-04-28 NOTE — Progress Notes (Signed)
PULMONARY/CRITICAL CARE  Name: [name] [name] MRN: [id number] DOB: Mar 25, 1951    ADMISSION DATE:  04/25/2013 CONSULTATION DATE:  04/25/13  REFERRING MD :  ED PRIMARY SERVICE: PCCM  CHIEF COMPLAINT:  Shaking chills, weakness   BRIEF PATIENT DESCRIPTION: [name] is a 63 y.o. male w/ PMHx of CAD s/p CABG, CHF w/ AICD/PPM (04/06/12), A-fib (on Coumadin) PAD s/p R. Femoral endarterectomy and R/ Iliac stent placement,  CKD, HTN, HLD, DM type II, transferred from [facility] ED w/ likely septic shock 2/2 bacteremia and acute respiratory failure requiring BiPAP.   SIGNIFICANT EVENTS / STUDIES:  1/27 - Transferred from [facility] ED w/ hypotension 1/27 - L. Femoral line placed, started on Levophed 1/28  OR R groin exploration and debridement w/ removal of bovine pericardial patch and replacement of vein patch, saphenous vein  LINES / TUBES: L. Femoral CVL 1/27 >>>1/28 RIJ 1/28 >>>> OETT 1/28 >>>  CULTURES: Blood 1/27 >>>canceled R. Femoral wound 1/27 >>> Moderate Staph Aureus R femoral wound 1/28 >>> 1/2 R G+ cocci in pairs R femoral wound drainage 1/28 >>> Moderate stap aureus  ANTIBIOTICS: Vancomycin 1/27 >>> Cefepime 1/27 >>>1/30 Flagyl 1/28 >>>1/30 unasyn 1/30>>>  HISTORY OF PRESENT ILLNESS:  [name] is a 63 y.o. male w/ PMHx of CAD s/p CABG, CHF w/ AICD/PPM (04/06/12), A-fib (on Coumadin) PAD s/p R. Femoral endarterectomy and R/ Iliac stent placement,  CKD, HTN, HLD, DM type II, transferred from [facility] ED after having recent severe weakness, and shaking chills at home for the past few days. In ED, found to be desatting, placed on BiPAP. Hypotensive on arrival to [facility], R. Femoral line placed, Levophed started. Patient likely w/ septic shock 2/2 bacteremia.   SUBJECTIVE:  Afebrile overnight, no acute issues, rate controlled  VITAL SIGNS: Temp:  [98.1 F (36.7 C)-99.2 F (37.3 C)] 98.1 F (36.7 C) (01/30 0401) Pulse Rate:  [42-128] 124  (01/30 0500) Resp:  [8-22] 16 (01/30 0500) BP: (89-167)/(47-103) 129/93 mmHg (01/30 0500) SpO2:  [99 %-100 %] 100 % (01/30 0500) FiO2 (%):  [40 %] 40 % (01/30 0400) Weight:  [215 lb 6.2 oz (97.7 kg)] 215 lb 6.2 oz (97.7 kg) (01/30 0500)  HEMODYNAMICS: CVP:  [10 mmHg-16 mmHg] 10 mmHg VENTILATOR SETTINGS: Vent Mode:  [-] PRVC FiO2 (%):  [40 %] 40 % Set Rate:  [16 bmp] 16 bmp Vt Set:  [560 mL] 560 mL PEEP:  [5 cmH20] 5 cmH20 Pressure Support:  [5 cmH20] 5 cmH20 Plateau Pressure:  [14 cmH20-16 cmH20] 16 cmH20 INTAKE / OUTPUT: Intake/Output     01/29 0701 - 01/30 0700   I.V. (mL/kg) 533.4 (5.5)   NG/GT 125   IV Piggyback 500   Total Intake(mL/kg) 1158.4 (11.9)   Urine (mL/kg/hr) 1090 (0.5)   Total Output 1090   Net +68.4         PHYSICAL EXAMINATION: General:  Sedated ETT in palce Neuro:   RASS -2 HEENT:  [place]/AT OETT in place Cardiovascular:  RRR. No murmurs, gallops, rubs.  Lungs:  B/L ronchi Abdomen:  Soft, non-tender, non-distended. BS +. R. Femoral wound bandage c/d/i.  Musculoskeletal:  No LE edema.  Skin:  Multiple small ulcers present on upper and lower extremities. No erythema, or rash.   LABS:  CBC  Recent Labs Lab 04/26/13 0345  04/26/13 1451 04/27/13 0440 04/28/13 0425  WBC 15.7*  --   --  8.1 8.5  HGB 9.7*  < > 9.2* 8.7* 8.9*  HCT 30.2*  < >  27.0* 26.6* 27.1*  PLT 173  --   --  100* 93*  < > = values in this interval not displayed. Coag's  Recent Labs Lab 04/26/13 0645  INR 1.58*   BMET  Recent Labs Lab 04/27/13 0440 04/27/13 1800 04/28/13 0425  NA 139 137 139  K 3.6* 4.3 4.9  CL 106 105 105  CO2 19 19 19   BUN 25* 18 23  CREATININE 1.02 0.82 0.88  GLUCOSE 139* 99 198*   Electrolytes  Recent Labs Lab 04/26/13 0345  04/27/13 0440 04/27/13 1800 04/28/13 0425  CALCIUM 7.5*  < > 7.5* 7.8* 8.0*  MG 1.3*  --  2.6*  --   --   < > = values in this interval not displayed. Sepsis Markers  Recent Labs Lab 04/26/13 0645  04/26/13 1730 04/27/13 0756  LATICACIDVEN 3.6* 3.2* 2.0   ABG  Recent Labs Lab 04/26/13 1201 04/26/13 1443 04/27/13 1145  PHART 7.305* 7.271* 7.362  PCO2ART 39.8 43.4 37.4  PO2ART 198.0* 373.0* 171.0*   Liver Enzymes  Recent Labs Lab 04/26/13 0345 04/26/13 0930  AST 59* 80*  ALT 28 38  ALKPHOS 72 86  BILITOT 1.1 1.7*  ALBUMIN 2.4* 2.6*    Cardiac Enzymes  Recent Labs Lab 04/25/13 2205  04/26/13 0943 04/26/13 1730 04/27/13 0755  TROPONINI  --   < > 0.45* 0.53* 0.34*  PROBNP 8808.0*  --   --   --   --   < > = values in this interval not displayed. Glucose  Recent Labs Lab 04/27/13 1545 04/27/13 1725 04/27/13 1822 04/27/13 1918 04/28/13 0005 04/28/13 0401  GLUCAP 98 93 94 111* 148* 176*    CXR 1/30 >>> Mild pulmonary vascular congestion, Hardware in good position  ASSESSMENT / PLAN:  PULMONARY A: Acute respiratory failure Pulmonary edema improved Does not want intubation for prolonged peorid P:   Goal SpO2 >92% Wean as tolerated, cpap5 ps 5, goal 1 hr, with NO option re intubation? Will calrify Likely extubation today Even goals balance  pcxr in am   CARDIOVASCULAR A:  septic shock- resolved Recent R. Femoral endarterectomy w/ iliac stent. Poor wound healing. H/o CAD s/p CABG H/o A-fib, on Coumadin.  Chronic RBBB CHF Ef 20-25% diffuse hypokinesis, PA peak 2/30 Mildly + Trop, trended down- secondary to demand from shock P:  Solu-Cortef 50 mg IV q6h (on prednisone 5 bid at home), reduce as now HTN vascular surgery following d5 1/2ns kvo  May need to add back some of home regimen Hydralazine, Losartan, Imdur, ASA, digoxin, Coumadin Coreg back Dig level  RENAL A:   Acute Renal Failure, ATN Lactic acidosis- resolved Stay even as goal  Hypokalemia-resolved P:   D5 1/2NS kvo bmet in am Diuresis as bp tolerates, lasix 40 bid  GASTROINTESTINAL A:   H/o GERD On home PPI Nutrition P:   NPO, start TF if not  extubated Protonix  HEMATOLOGIC A:   A-fib on Coumadin No Leukocytosis DVT/PE PPx Thrombocytopenia, likely dilution, sepsis P: .  Trend PT/INR CBC in AM SCD's for now  Start heparin after surgery in 1 day after d/w vascular kvo Repeat plat after lasix  INFECTIOUS A:   septic shock 2/2 possible bacteremia s/p endarterectomy,no stool to even send for cdiff P:   Keep vanc until r/o MRSA Add uynasyn, dc cefepime, flagyl Monitor R. Femoral wound Send cdiff if diarrhea noted   ENDOCRINE A:   H/o DM type II On chronic steroids at home, adrenal insuff Hyperglycemia  P:   Solu-Cortef as above, reduce, HTN and rise glu CBG's q4h Restart Insulin drip May need IV insulin bolus dosing  NEUROLOGIC A:   Vent dyschrony P:   Continue to monitor fent prn  WUA upright  Gust Rung, DO 04/28/2013 6:42 AM  Ccm time 30 min  I have fully examined this patient and agree with above findings.    And edited in full  [name]. [name], MD, FACP Pgr: [phone] [facility]

## 2013-04-28 NOTE — Procedures (Signed)
Extubation Procedure Note  Patient Details:   Name: [name] [name] DOB: November 21, 1950 MRN: [id number]   Airway Documentation:     Evaluation  O2 sats: stable throughout Complications: No apparent complications Patient did tolerate procedure well. Bilateral Breath Sounds: Clear;Diminished Suctioning: Oral;Airway Yes  Pt tolerated wean. Positive for cuff leak. Pt extubated to 4lpm [place]. No stridor or dyspnea noted. RT will continue to monitor.  Armando Gang 04/28/2013, 10:51 AM

## 2013-04-29 DIAGNOSIS — E1159 Type 2 diabetes mellitus with other circulatory complications: Secondary | ICD-10-CM

## 2013-04-29 DIAGNOSIS — Z951 Presence of aortocoronary bypass graft: Secondary | ICD-10-CM

## 2013-04-29 DIAGNOSIS — I798 Other disorders of arteries, arterioles and capillaries in diseases classified elsewhere: Secondary | ICD-10-CM

## 2013-04-29 LAB — TYPE AND SCREEN
ABO/RH(D): A POS
Antibody Screen: NEGATIVE
UNIT DIVISION: 0
Unit division: 0
Unit division: 0

## 2013-04-29 LAB — WOUND CULTURE: Gram Stain: NONE SEEN

## 2013-04-29 LAB — GLUCOSE, CAPILLARY
GLUCOSE-CAPILLARY: 189 mg/dL — AB (ref 70–99)
Glucose-Capillary: 155 mg/dL — ABNORMAL HIGH (ref 70–99)
Glucose-Capillary: 152 mg/dL — ABNORMAL HIGH (ref 70–99)
Glucose-Capillary: 167 mg/dL — ABNORMAL HIGH (ref 70–99)
Glucose-Capillary: 174 mg/dL — ABNORMAL HIGH (ref 70–99)

## 2013-04-29 LAB — PROTIME-INR
INR: 1.53 — AB (ref 0.00–1.49)
Prothrombin Time: 18 seconds — ABNORMAL HIGH (ref 11.6–15.2)

## 2013-04-29 MED ORDER — DIGOXIN 0.05 MG/ML PO SOLN
0.1250 mg | Freq: Every day | ORAL | Status: DC
  Administered 2013-04-29 – 2013-05-05 (×7): 0.125 mg via ORAL
  Filled 2013-04-29 (×7): qty 2.5

## 2013-04-29 MED ORDER — CARVEDILOL 6.25 MG PO TABS
6.2500 mg | ORAL_TABLET | Freq: Two times a day (BID) | ORAL | Status: DC
  Administered 2013-04-29 – 2013-05-04 (×10): 6.25 mg via ORAL
  Filled 2013-04-29 (×12): qty 1

## 2013-04-29 MED ORDER — CLOPIDOGREL BISULFATE 75 MG PO TABS
75.0000 mg | ORAL_TABLET | Freq: Every day | ORAL | Status: DC
  Administered 2013-04-30 – 2013-05-05 (×6): 75 mg via ORAL
  Filled 2013-04-29 (×7): qty 1

## 2013-04-29 MED ORDER — OXYCODONE-ACETAMINOPHEN 5-325 MG PO TABS
1.0000 | ORAL_TABLET | ORAL | Status: DC | PRN
  Administered 2013-04-29 – 2013-05-05 (×17): 2 via ORAL
  Filled 2013-04-29 (×17): qty 2

## 2013-04-29 MED ORDER — WHITE PETROLATUM GEL
Status: AC
  Administered 2013-04-29: 11:00:00
  Filled 2013-04-29: qty 5

## 2013-04-29 MED ORDER — HYDROCORTISONE NA SUCCINATE PF 100 MG IJ SOLR
25.0000 mg | Freq: Two times a day (BID) | INTRAMUSCULAR | Status: DC
  Administered 2013-04-29 – 2013-04-30 (×2): 25 mg via INTRAVENOUS
  Filled 2013-04-29 (×4): qty 0.5

## 2013-04-29 MED ORDER — WARFARIN SODIUM 5 MG PO TABS
5.0000 mg | ORAL_TABLET | Freq: Once | ORAL | Status: AC
  Administered 2013-04-29: 5 mg via ORAL
  Filled 2013-04-29: qty 1

## 2013-04-29 MED ORDER — WARFARIN - PHARMACIST DOSING INPATIENT: Freq: Every day | Status: DC

## 2013-04-29 MED ORDER — FUROSEMIDE 40 MG PO TABS
40.0000 mg | ORAL_TABLET | Freq: Every day | ORAL | Status: DC
  Administered 2013-04-30 – 2013-05-05 (×6): 40 mg via ORAL
  Filled 2013-04-29 (×7): qty 1

## 2013-04-29 MED ORDER — ASPIRIN EC 81 MG PO TBEC
81.0000 mg | DELAYED_RELEASE_TABLET | Freq: Every day | ORAL | Status: DC
  Administered 2013-04-29 – 2013-05-05 (×7): 81 mg via ORAL
  Filled 2013-04-29 (×7): qty 1

## 2013-04-29 NOTE — Discharge Instructions (Addendum)
Information on my medicine - Coumadin   (Warfarin)  This medication education was reviewed with me or my healthcare representative as part of my discharge preparation.  The pharmacist that spoke with me during my hospital stay was:  Lorenda Peck, [facility]  Why was Coumadin prescribed for you? Coumadin was prescribed for you because you have a blood clot or a medical condition that can cause an increased risk of forming blood clots. Blood clots can cause serious health problems by blocking the flow of blood to the heart, lung, or brain. Coumadin can prevent harmful blood clots from forming. As a reminder your indication for Coumadin is:   Stroke Prevention Because Of Atrial Fibrillation  What test will check on my response to Coumadin? While on Coumadin (warfarin) you will need to have an INR test regularly to ensure that your dose is keeping you in the desired range. The INR (international normalized ratio) number is calculated from the result of the laboratory test called prothrombin time (PT).  If an INR APPOINTMENT HAS NOT ALREADY BEEN MADE FOR YOU please schedule an appointment to have this lab work done by your health care provider within 7 days. Your INR goal is usually a number between:  2 to 3 or your provider may give you a more narrow range like 2-2.5.  Ask your health care provider during an office visit what your goal INR is.  What  do you need to  know  About  COUMADIN? Take Coumadin (warfarin) exactly as prescribed by your healthcare provider about the same time each day.  DO NOT stop taking without talking to the doctor who prescribed the medication.  Stopping without other blood clot prevention medication to take the place of Coumadin may increase your risk of developing a new clot or stroke.  Get refills before you run out.  What do you do if you miss a dose? If you miss a dose, take it as soon as you remember on the same day then continue your regularly scheduled regimen the next  day.  Do not take two doses of Coumadin at the same time.  Important Safety Information A possible side effect of Coumadin (Warfarin) is an increased risk of bleeding. You should call your healthcare provider right away if you experience any of the following:   Bleeding from an injury or your nose that does not stop.   Unusual colored urine (red or dark brown) or unusual colored stools (red or black).   Unusual bruising for unknown reasons.   A serious fall or if you hit your head (even if there is no bleeding).  Some foods or medicines interact with Coumadin (warfarin) and might alter your response to warfarin. To help avoid this:   Eat a balanced diet, maintaining a consistent amount of Vitamin K.   Notify your provider about major diet changes you plan to make.   Avoid alcohol or limit your intake to 1 drink for women and 2 drinks for men per day. (1 drink is 5 oz. wine, 12 oz. beer, or 1.5 oz. liquor.)  Make sure that ANY health care provider who prescribes medication for you knows that you are taking Coumadin (warfarin).  Also make sure the healthcare provider who is monitoring your Coumadin knows when you have started a new medication including herbals and non-prescription products.  Coumadin (Warfarin)  Major Drug Interactions  Increased Warfarin Effect Decreased Warfarin Effect  Alcohol (large quantities) Antibiotics (esp. Septra/Bactrim, Flagyl, Cipro) Amiodarone (Cordarone) Aspirin (  ASA) Cimetidine (Tagamet) Megestrol (Megace) NSAIDs (ibuprofen, naproxen, etc.) Piroxicam (Feldene) Propafenone (Rythmol SR) Propranolol (Inderal) Isoniazid (INH) Posaconazole (Noxafil) Barbiturates (Phenobarbital) Carbamazepine (Tegretol) Chlordiazepoxide (Librium) Cholestyramine (Questran) Griseofulvin Oral Contraceptives Rifampin Sucralfate (Carafate) Vitamin K   Coumadin (Warfarin) Major Herbal Interactions  Increased Warfarin Effect Decreased Warfarin Effect   Garlic Ginseng Ginkgo biloba Coenzyme Q10 Green tea St. Johns wort    Coumadin (Warfarin) FOOD Interactions  Eat a consistent number of servings per week of foods HIGH in Vitamin K (1 serving =  cup)  Collards (cooked, or boiled & drained) Kale (cooked, or boiled & drained) Mustard greens (cooked, or boiled & drained) Parsley *serving size only =  cup Spinach (cooked, or boiled & drained) Swiss chard (cooked, or boiled & drained) Turnip greens (cooked, or boiled & drained)  Eat a consistent number of servings per week of foods MEDIUM-HIGH in Vitamin K (1 serving = 1 cup)  Asparagus (cooked, or boiled & drained) Broccoli (cooked, boiled & drained, or raw & chopped) Brussel sprouts (cooked, or boiled & drained) *serving size only =  cup Lettuce, raw (green leaf, endive, romaine) Spinach, raw Turnip greens, raw & chopped   These websites have more information on Coumadin (warfarin):  http://www.king-russell.com/; https://www.hines.net/;  Blood Glucose Monitoring, Adult Monitoring your blood glucose (also know as blood sugar) helps you to manage your diabetes. It also helps you and your health care provider monitor your diabetes and determine how well your treatment plan is working. WHY SHOULD YOU MONITOR YOUR BLOOD GLUCOSE?  It can help you understand how food, exercise, and medicine affect your blood glucose.  It allows you to know what your blood glucose is at any given moment. You can quickly tell if you are having low blood glucose (hypoglycemia) or high blood glucose (hyperglycemia).  It can help you and your health care provider know how to adjust your medicines.  It can help you understand how to manage an illness or adjust medicine for exercise. WHEN SHOULD YOU TEST? Your health care provider will help you decide how often you should check your blood glucose. This may depend on the type of diabetes you have, your diabetes control, or the types of medicines you are  taking. Be sure to write down all of your blood glucose readings so that this information can be reviewed with your health care provider. See below for examples of testing times that your health care provider may suggest. Type 1 Diabetes  Test 4 times a day if you are in good control, using an insulin pump, or perform multiple daily injections.  If your diabetes is not well-controlled or if you are sick, you may need to monitor more often.  It is a good idea to also monitor:  Before and after exercise.  Between meals and 2 hours after a meal.  Occasionally between 2:00 to 3:00 am. Type 2 Diabetes  It can vary with each person, but generally, if you are on insulin, test 4 times a day.  If you take medicines by mouth (orally), test 2 times a day.  If you are on a controlled diet, test once a day.  If your diabetes is not well controlled or if you are sick, you may need to monitor more often. HOW TO MONITOR YOUR BLOOD GLUCOSE Supplies Needed  Blood glucose meter.  Test strips for your meter. Each meter has its own strips. You must use the strips that go with your own meter.  A pricking needle (lancet).  A  device that holds the lancet (lancing device).  A journal or log book to write down your results. Procedure  Wash your hands with soap and water. Alcohol is not preferred.  Prick the side of your finger (not the tip) with the lancet.  Gently milk the finger until a small drop of blood appears.  Follow the instructions that come with your meter for inserting the test strip, applying blood to the strip, and using your blood glucose meter. Other Areas to Get Blood for Testing Some meters allow you to use other areas of your body (other than your finger) to test your blood. These areas are called alternative sites. The most common alternative sites are:  The forearm.  The thigh.  The back area of the lower leg.  The palm of the hand. The blood flow in these areas is  slower. Therefore, the blood glucose values you get may be delayed, and the numbers are different from what you would get from your fingers. Do not use alternative sites if you think you are having hypoglycemia. Your reading will not be accurate. Always use a finger if you are having hypoglycemia. Also, if you cannot feel your lows (hypoglycemia unawareness), always use your fingers for your blood glucose checks. ADDITIONAL TIPS FOR GLUCOSE MONITORING  Do not reuse lancets.  Always carry your supplies with you.  All blood glucose meters have a 24-hour "hotline" number to call if you have questions or need help.  Adjust (calibrate) your blood glucose meter with a control solution after finishing a few boxes of strips. BLOOD GLUCOSE RECORD KEEPING It is a good idea to keep a daily record or log of your blood glucose readings. Most glucose meters, if not all, keep your glucose records stored in the meter. Some meters come with the ability to download your records to your home computer. Keeping a record of your blood glucose readings is especially helpful if you are wanting to look for patterns. Make notes to go along with the blood glucose readings because you might forget what happened at that exact time. Keeping good records helps you and your health care provider to work together to achieve good diabetes management.  Document Released: 03/19/2003 Document Revised: 11/16/2012 Document Reviewed: 08/08/2012 [facility] Patient Information 2014 [place], [place]. Hand Washing Staying healthy is important to you and your entire family. Follow these easy, low-cost steps to help stop many infectious diseases before they happen. HOW TO [facility]  Wet your hands and apply liquid, bar, or powder soap.  Rub hands together vigorously to make a lather and scrub all surfaces. Be sure to clean between the fingers and around the nails.  Continue for 20 seconds! It takes that long for the soap and scrubbing action to  dislodge and remove stubborn germs.  Rinse hands well under running water.  Dry your hands using a paper towel or air dryer.  If possible, use your paper towel or elbow to turn off the faucet. This will help avoid re-exposure to germs on the handle. WHEN TO [facility] YOUR HANDS  Before and after eating.  Before, during, and after handling or preparing food.  After contact with blood or body fluids (like vomit, nasal secretions, or saliva). This means washing after you blow your nose!  Before and after changing a diaper.  After you use the bathroom.  After handling animals, their toys, leashes, or waste.  After touching something that could be contaminated (such as a trash can, cleaning cloth, drain, or  soil).  Before and after taking care of (dressing) a wound, giving medicine, or inserting contact lenses.  More often when someone in your home is sick.  Whenever your hands become soiled. If soap and water are not available, use an alcohol-based wipe or hand gel. Keeping your hands clean is one of the best ways to keep from getting sick and spreading illnesses. Cleaning your hands gets rid of germs you pick up:  From other people.  From the surfaces you touch.  From the animals you come in contact with. Document Released: 11/04/2004 Document Revised: 07/11/2012 Document Reviewed: 04/11/2008 [facility] Patient Information 2014 [place], [place].

## 2013-04-29 NOTE — Progress Notes (Signed)
ANTICOAGULATION CONSULT NOTE - Initial Consult  Pharmacy Consult for coumadin Indication: atrial fibrillation  Allergies  Allergen Reactions  . Lipitor [Atorvastatin] Other (See Comments)    Weak muscles  . Lodine [Etodolac] Nausea And Vomiting  . Methotrexate Derivatives Nausea And Vomiting  . Remicade [Infliximab] Other (See Comments)    Chills and shakes   . Ranitidine Hcl Nausea Only    Patient Measurements: Height: 5' 8.9" (175 cm) Weight: 216 lb 0.8 oz (98 kg) IBW/kg (Calculated) : 70.47  Vital Signs: Temp: 98.7 F (37.1 C) (01/31 1330) Temp src: Oral (01/31 1330) BP: 123/56 mmHg (01/31 1330) Pulse Rate: 87 (01/31 1330)  Labs:  Recent Labs  04/26/13 1730 04/27/13 0440 04/27/13 0755 04/27/13 1800 04/28/13 0425  HGB  --  8.7*  --   --  8.9*  HCT  --  26.6*  --   --  27.1*  PLT  --  100*  --   --  93*  CREATININE 1.25 1.02  --  0.82 0.88  TROPONINI 0.53*  --  0.34*  --   --     Estimated Creatinine Clearance: 102.9 ml/min (by C-G formula based on Cr of 0.88).   Medical History: Past Medical History  Diagnosis Date  . Diabetes mellitus   . Hypertension   . Hyperlipidemia   . Rheumatoid arthritis   . CAD (coronary artery disease)   . Myocardial infarction 2000 & 2006  . GERD (gastroesophageal reflux disease)   . Leg pain   . CHF (congestive heart failure)   . Chronic kidney disease   . PAD (peripheral artery disease)   . Decubitus ulcer of right ankle   . 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 Jude - single chamber ICD  . Pacemaker     Medications:  Scheduled:  . ampicillin-sulbactam (UNASYN) IV  1.5 g Intravenous Q6H  . antiseptic oral rinse  15 mL Mouth Rinse QID  . aspirin EC  81 mg Oral Daily  . carvedilol  6.25 mg Oral BID WC  . chlorhexidine  15 mL Mouth Rinse BID  . [START ON 04/30/2013] clopidogrel  75 mg Oral Q breakfast  . digoxin  0.125 mg Oral Daily  . furosemide  40 mg Oral Daily  .  hydrocortisone sod succinate (SOLU-CORTEF) inj  25 mg Intravenous Q12H  . insulin aspart  0-20 Units Subcutaneous Q4H  . insulin glargine  40 Units Subcutaneous Q24H  . pantoprazole (PROTONIX) IV  40 mg Intravenous QHS  . vancomycin  1,000 mg Intravenous Q8H    Assessment: 63 yo M admitted on 1/27 for a poorly healing right groin wound and sepsis. He is on chronic coumadin therapy for atrial fibrillation though it has been held during this admission. Patient is s/p I & D, and coumadin is to be restarted per pharmacy today.  Patient's home dose is 5 mg daily. Patient states he has been stable on this dose for a "long time".  INR today is subtherapeutic at 1.53. CBC from 1/30 shows a low but stable Hgb at 8.9. No bleeding issues noted.  Mr. Liburd is also on Plavix and aspirin.   Goal of Therapy:  INR 2-3 Monitor platelets by anticoagulation protocol: Yes   Plan:  - Restart patient's home regimen - will give 5mg  PO x 1 dose tonight - Daily INR - F/U bleeding complications  Storie Heffern C. Kimaria Struthers, PharmD Clinical Pharmacist-Resident Pager: [phone] Pharmacy: [phone] 04/29/2013 3:13 PM

## 2013-04-29 NOTE — Progress Notes (Signed)
   VASCULAR PROGRESS NOTE  SUBJECTIVE: No complaints.  PHYSICAL EXAM: Filed Vitals:   04/29/13 0039 04/29/13 0409 04/29/13 0500 04/29/13 0513  BP: 162/74 138/81    Pulse: 56 69  69  Temp:  97.4 F (36.3 C)    TempSrc:  Oral    Resp:  24  16  Height:      Weight:   216 lb 0.8 oz (98 kg)   SpO2:  97%  99%   Dressing right groin was just changed by nurse. Wound is clean with some serosanguinous drainage. Right foot warm.    LABS: Lab Results  Component Value Date   WBC 8.5 04/28/2013   HGB 8.9* 04/28/2013   HCT 27.1* 04/28/2013   MCV 83.4 04/28/2013   PLT 93* 04/28/2013   Lab Results  Component Value Date   CREATININE 0.88 04/28/2013   Lab Results  Component Value Date   INR 1.58* 04/26/2013   CBG (last 3)   Recent Labs  04/28/13 2108 04/29/13 0046 04/29/13 0415  GLUCAP 215* 189* 155*   Principal Problem:   Septic shock Active Problems:   Shock  MICRO: C. Diff negative OR culture: Staph aureus  ASSESSMENT AND PLAN:  * 3 Days Post-Op s/p: Debridement of right groin and replacement of Bovine patch with vein patch.  * On IV Unasyn and Vanco  * Doing well from a pulmonary standpoint.  * Add po pain meds.    Cari Caraway Beeper: [phone] 04/29/2013

## 2013-04-29 NOTE — Evaluation (Signed)
Physical Therapy Evaluation Patient Details Name: [name] [name] MRN: [id number] DOB: Aug 22, 1950 Today's Date: 04/29/2013 Time: 3151-7616 PT Time Calculation (min): 25 min  PT Assessment / Plan / Recommendation History of Present Illness  Pt admitted for I&D of R groin abcess  Clinical Impression  Pt deconditioned with R LE pain limiting transfer and ambulation ability at this time. Suspect pt to progress well enough for safe d/c home with spouse. Pt to benefit from HHPT to achieve safe mod I function so wife can return to work and pt can stay home alone. Wife is interested in speaking with social worker regarding getting paid to care for spouse until he is independent again.    PT Assessment  Patient needs continued PT services    Follow Up Recommendations  Home health PT;Supervision/Assistance - 24 hour    Does the patient have the potential to tolerate intense rehabilitation      Barriers to Discharge        Equipment Recommendations  3in1 (PT)    Recommendations for Other Services     Frequency Min 3X/week    Precautions / Restrictions Precautions Precautions: Fall Restrictions Weight Bearing Restrictions: No   Pertinent Vitals/Pain 9/10 R groin pain, RN just provided pain pill 15 min prior to PT      Mobility  Bed Mobility Overal bed mobility: Needs Assistance Bed Mobility: Supine to Sit Supine to sit: Mod assist General bed mobility comments: assist for trunk elevation and R LE management/hips to EOB, max directional v/c's for hand placement Transfers Overall transfer level: Needs assistance Equipment used: Rolling walker (2 wheeled) Transfers: Sit to/from [employer] Sit to Stand: Mod assist Stand pivot transfers: Min assist General transfer comment: modA to initiate anterior weight shift, significant increase in time but able to achieve full upright standing. pt able to march in place x 10 reps and then complete std pvt to chair with  increased time Ambulation/Gait Ambulation/Gait assistance:  (deferred due to pain)    Exercises     PT Diagnosis: Difficulty walking;Acute pain  PT Problem List: Decreased strength;Decreased activity tolerance;Decreased balance;Decreased mobility PT Treatment Interventions: DME instruction;Gait training;Stair training;Functional mobility training;Therapeutic activities;Therapeutic exercise     PT Goals(Current goals can be found in the care plan section) Acute Rehab PT Goals Patient Stated Goal: home PT Goal Formulation: With patient Time For Goal Achievement: 05/06/13 Potential to Achieve Goals: Good  Visit Information  Last PT Received On: 04/29/13 Assistance Needed: +1 History of Present Illness: Pt admitted for I&D of R groin abcess       Prior Functioning  Home Living Family/patient expects to be discharged to:: Private residence Living Arrangements: Spouse/significant other Available Help at Discharge: Family;Available 24 hours/day Type of Home: House Home Access: Stairs to enter [employer] of Steps: 2 Entrance Stairs-Rails: Can reach both Home Layout: One level Home Equipment: Walker - 2 wheels;Cane - single point;Shower seat - built in;Wheelchair - manual Additional Comments: wife works during the day, desires to stay at home with spouse and get paid for it Prior Function Level of Independence: Needs assistance Gait / Transfers Assistance Needed: was using a RW, was in hospital before xmas ADL's / Homemaking Assistance Needed: assist for bathing, wound dressing changes Communication Communication: No difficulties Dominant Hand: Right    Cognition  Cognition Arousal/Alertness: Awake/alert Behavior During Therapy: WFL for tasks assessed/performed Overall Cognitive Status: Within Functional Limits for tasks assessed    Extremity/Trunk Assessment Upper Extremity Assessment Upper Extremity Assessment: Generalized weakness Lower  Extremity  Assessment Lower Extremity Assessment: Generalized weakness;RLE deficits/detail RLE Deficits / Details: limited by R groin pain from surgery Cervical / Trunk Assessment Cervical / Trunk Assessment: Normal   Balance Balance Overall balance assessment: Needs assistance Sitting-balance support: No upper extremity supported;Feet supported Sitting balance-Leahy Scale: Good Standing balance support:  (requires use of RW to steady self) Standing balance-Leahy Scale: Fair General Comments General comments (skin integrity, edema, etc.): pt with noted bilat UE and LE edema, pt with some seaping from R hand  End of Session PT - End of Session Equipment Utilized During Treatment: Gait belt Activity Tolerance: Patient limited by pain;Patient limited by fatigue Patient left: in chair;with call bell/phone within reach;with family/visitor present Nurse Communication: Mobility status  GP     Marcene Brawn 04/29/2013, 12:36 PM  Lewis Shock, PT, DPT Pager #: [phone] Office #: [phone] ]

## 2013-04-29 NOTE — Progress Notes (Addendum)
[facility] PROGRESS NOTE  MOSES MEIER ZWC:[id number] DOB: 07-06-1950 DOA: 04/25/2013 PCP: Gaspar Skeeters, MD  Assessment/Plan: 63 y.o. male w/ PMHx of CAD s/p CABG, CHF w/ AICD/PPM (04/06/12), A-fib (on Coumadin) PAD s/p R. Femoral endarterectomy and R/ Iliac stent placement, CKD, HTN, HLD, DM type II, transferred from [facility] ED w/ likely septic shock 2/2 bacteremia and acute respiratory failure requiring BiPAP->intubated.   1. Acute respiratory failure extubated (1/30) no re tube/DNR -resolved, cont prn bronchodilators, oxygen;   2. Septic shock septic shock 2/2 possible bacteremia s/p endarterectomy, wound c/s: +staph  -improving on atx, afebrile, pend final c/s to adjust atx, if afebrile d/c vanc in AM; wean steroids;   -per VSS: Groin exploration and debridement and removal of bovine pericardial patch and placement of vein patch, saphenous vein  3. DM cont insulin, hold PO meds; check a1c;   4. CHF/AICD/PPM (04/06/12) echo (1/28): LVEF 20-25%, Diffuse hypokinesis, dilated cardiomyopathy  -stable, resume coreg, plavix, dig, lasix, hold ARB    5. A fib on coumadin, HR uncontrolled with sepsis - resume dig, BB, coumadin; d/w VSS   6. CAD s/p CABG resume BB, ASA, Plavix; monitor   Code Status: DNR Family Communication: d/ w patient  (indicate person spoken with, relationship, and if by phone, the number) Disposition Plan: pend clinical improvement, PT eval   Consultants:  VSS SIGNIFICANT EVENTS / STUDIES:  1/27 - Transferred from [facility] ED w/ hypotension  1/27 - L. Femoral line placed, started on Levophed  1/28 OR R groin exploration and debridement w/ removal of bovine pericardial patch and replacement of vein patch, saphenous vein  LINES / TUBES:  L. Femoral CVL 1/27 >>>1/28  RIJ 1/28 >>>>  OETT 1/28 >>>  CULTURES:  Blood 1/27 >>>canceled  R. Femoral wound 1/27 >>> Moderate Staph Aureus  R femoral wound 1/28 >>> 1/2 R G+ cocci in pairs  R femoral  wound drainage 1/28 >>> Moderate stap aureus  ANTIBIOTICS:  Vancomycin 1/27 >>>  Cefepime 1/27 >>>1/30  Flagyl 1/28 >>>1/30  unasyn 1/30>>> HPI/Subjective: alert  Objective: Filed Vitals:   04/29/13 0513  BP:   Pulse: 69  Temp:   Resp: 16    Intake/Output Summary (Last 24 hours) at 04/29/13 0934 Last data filed at 04/29/13 0800  Gross per 24 hour  Intake   1140 ml  Output   1251 ml  Net   -111 ml   Filed Weights   04/27/13 0600 04/28/13 0500 04/29/13 0500  Weight: 97.6 kg (215 lb 2.7 oz) 97.7 kg (215 lb 6.2 oz) 98 kg (216 lb 0.8 oz)    Exam:   General:  alert  Cardiovascular: s1,s2 rrr  Respiratory: few crackle sin LL  Abdomen: soft, nt, nd   Musculoskeletal: no edema   Data Reviewed: Basic Metabolic Panel:  Recent Labs Lab 04/26/13 0345 04/26/13 0930  04/26/13 1451 04/26/13 1730 04/27/13 0440 04/27/13 1800 04/28/13 0425  NA 134* 135*  < > 136* 136* 139 137 139  K 5.0 5.0  < > 3.8 4.3 3.6* 4.3 4.9  CL 98 98  --   --  103 106 105 105  CO2 17* 16*  --   --  18* 19 19 19   GLUCOSE 388* 429*  --  284* 229* 139* 99 198*  BUN 22 25*  --   --  25* 25* 18 23  CREATININE 1.74* 1.51*  --   --  1.25 1.02 0.82 0.88  CALCIUM 7.5* 7.8*  --   --  7.3* 7.5* 7.8* 8.0*  MG 1.3*  --   --   --   --  2.6*  --   --   < > = values in this interval not displayed. Liver Function Tests:  Recent Labs Lab 04/26/13 0345 04/26/13 0930  AST 59* 80*  ALT 28 38  ALKPHOS 72 86  BILITOT 1.1 1.7*  PROT 5.5* 6.1  ALBUMIN 2.4* 2.6*   No results found for this basename: LIPASE, AMYLASE,  in the last 168 hours No results found for this basename: AMMONIA,  in the last 168 hours CBC:  Recent Labs Lab 04/26/13 0345 04/26/13 1443 04/26/13 1451 04/27/13 0440 04/28/13 0425  WBC 15.7*  --   --  8.1 8.5  HGB 9.7* 8.2* 9.2* 8.7* 8.9*  HCT 30.2* 24.0* 27.0* 26.6* 27.1*  MCV 83.0  --   --  83.1 83.4  PLT 173  --   --  100* 93*   Cardiac Enzymes:  Recent Labs Lab  04/26/13 0645 04/26/13 0943 04/26/13 1730 04/27/13 0755  TROPONINI 0.41* 0.45* 0.53* 0.34*   BNP (last 3 results)  Recent Labs  03/16/13 0405 03/19/13 0430 04/25/13 2205  PROBNP 1650.0* 2143.0* 8808.0*   CBG:  Recent Labs Lab 04/28/13 1635 04/28/13 2108 04/29/13 0046 04/29/13 0415 04/29/13 0820  GLUCAP 259* 215* 189* 155* 152*    Recent Results (from the past 240 hour(s))  WOUND CULTURE     Status: None   Collection Time    04/25/13 10:10 PM      Result Value Range Status   Specimen Description WOUND GROIN RIGHT   Final   Special Requests Normal   Final   Gram Stain     Final   Value: NO WBC SEEN     NO SQUAMOUS EPITHELIAL CELLS SEEN     NO ORGANISMS SEEN     Performed at [facility]   Culture     Final   Value: MODERATE STAPHYLOCOCCUS AUREUS     Note: RIFAMPIN AND GENTAMICIN SHOULD NOT BE USED AS SINGLE DRUGS FOR TREATMENT OF STAPH INFECTIONS.     Performed at [facility]   Report Status 04/28/2013 FINAL   Final   Organism ID, Bacteria STAPHYLOCOCCUS AUREUS   Final  WOUND CULTURE     Status: None   Collection Time    04/26/13 12:05 AM      Result Value Range Status   Specimen Description WOUND RIGHT GROIN DRAINAGE   Final   Special Requests Normal   Final   Gram Stain     Final   Value: NO WBC SEEN     NO SQUAMOUS EPITHELIAL CELLS SEEN     NO ORGANISMS SEEN     Performed at [facility]   Culture     Final   Value: MODERATE STAPHYLOCOCCUS AUREUS     Note: RIFAMPIN AND GENTAMICIN SHOULD NOT BE USED AS SINGLE DRUGS FOR TREATMENT OF STAPH INFECTIONS.     Performed at [facility]   Report Status 04/28/2013 FINAL   Final   Organism ID, Bacteria STAPHYLOCOCCUS AUREUS   Final  MRSA PCR SCREENING     Status: None   Collection Time    04/26/13  1:48 AM      Result Value Range Status   MRSA by PCR NEGATIVE  NEGATIVE Final   Comment:            The GeneXpert MRSA Assay (FDA     approved  for NASAL specimens     only), is  one component of a     comprehensive MRSA colonization     surveillance program. It is not     intended to diagnose MRSA     infection nor to guide or     monitor treatment for     MRSA infections.  WOUND CULTURE     Status: None   Collection Time    04/26/13  1:43 PM      Result Value Range Status   Specimen Description WOUND RIGHT GROIN   Final   Special Requests SPECIMEN NO.1 PT ON FLAGYL,VANCOMYCIN,MAXIPINE   Final   Gram Stain     Final   Value: NO WBC SEEN     NO SQUAMOUS EPITHELIAL CELLS SEEN     NO ORGANISMS SEEN     Performed at [facility]   Culture     Final   Value: FEW STAPHYLOCOCCUS AUREUS     Note: RIFAMPIN AND GENTAMICIN SHOULD NOT BE USED AS SINGLE DRUGS FOR TREATMENT OF STAPH INFECTIONS.     Performed at [facility]   Report Status PENDING   Incomplete  WOUND CULTURE     Status: None   Collection Time    04/26/13  1:46 PM      Result Value Range Status   Specimen Description WOUND RIGHT GROIN   Final   Special Requests SPECIMEN NO. 2 PT ON FLAGYL,VANCOMYCIN,MAXIPINE   Final   Gram Stain     Final   Value: RARE WBC PRESENT,BOTH PMN AND MONONUCLEAR     NO SQUAMOUS EPITHELIAL CELLS SEEN     RARE GRAM POSITIVE COCCI IN PAIRS     Performed at [facility]   Culture     Final   Value: MODERATE STAPHYLOCOCCUS AUREUS     Note: RIFAMPIN AND GENTAMICIN SHOULD NOT BE USED AS SINGLE DRUGS FOR TREATMENT OF STAPH INFECTIONS.     Performed at [facility]   Report Status PENDING   Incomplete  CLOSTRIDIUM DIFFICILE BY PCR     Status: None   Collection Time    04/28/13  2:00 PM      Result Value Range Status   C difficile by pcr NEGATIVE  NEGATIVE Final     Studies: Dg Chest Port 1 View  04/28/2013   CLINICAL DATA:  Endotracheal tube replacement.  EXAM: PORTABLE CHEST - 1 VIEW  COMPARISON:  One-view chest 04/27/2013.  FINDINGS: The endotracheal tube has been repositioned. It now terminates 4 cm above the carina. The NG tube courses  off the inferior border the film. A right IJ line is stable. Lung volumes are slightly improved. Left basilar airspace disease persists. A small left pleural effusion is suspected. Mild pulmonary vascular congestion is improved.  IMPRESSION: 1. Repositioning of the endotracheal tube, now in satisfactory position. 2. Improved lung volumes. 3. Persistent left basilar airspace disease likely representing atelectasis. 4. Suspect small left pleural effusion. 5. Decreasing pulmonary vascular congestion.   Electronically Signed   By: Gennette Pac M.D.   On: 04/28/2013 07:28    Scheduled Meds: . ampicillin-sulbactam (UNASYN) IV  1.5 g Intravenous Q6H  . antiseptic oral rinse  15 mL Mouth Rinse QID  . chlorhexidine  15 mL Mouth Rinse BID  . digoxin  0.125 mg Oral Daily  . furosemide  40 mg Intravenous BID  . hydrocortisone sod succinate (SOLU-CORTEF) inj  25 mg Intravenous Q6H  . insulin aspart  0-20 Units Subcutaneous Q4H  . insulin glargine  40 Units Subcutaneous Q24H  . metoprolol  5 mg Intravenous Q6H  . pantoprazole (PROTONIX) IV  40 mg Intravenous QHS  . vancomycin  1,000 mg Intravenous Q8H   Continuous Infusions: . dextrose 5 % and 0.45% NaCl 10 mL/hr at 04/29/13 0806  . vasopressin (PITRESSIN) infusion - *FOR SHOCK* Stopped (04/27/13 0430)    Principal Problem:   Septic shock Active Problems:   Shock    Time spent: >35 minutes     Esperanza Sheets  [facility] Pager [phone]. If 7PM-7AM, please contact night-coverage at [email or web address], password [facility] 04/29/2013, 9:34 AM  LOS: 4 days

## 2013-04-30 DIAGNOSIS — I10 Essential (primary) hypertension: Secondary | ICD-10-CM

## 2013-04-30 DIAGNOSIS — Z7901 Long term (current) use of anticoagulants: Secondary | ICD-10-CM

## 2013-04-30 DIAGNOSIS — M069 Rheumatoid arthritis, unspecified: Secondary | ICD-10-CM | POA: Diagnosis present

## 2013-04-30 DIAGNOSIS — E785 Hyperlipidemia, unspecified: Secondary | ICD-10-CM | POA: Diagnosis present

## 2013-04-30 DIAGNOSIS — Z95810 Presence of automatic (implantable) cardiac defibrillator: Secondary | ICD-10-CM

## 2013-04-30 DIAGNOSIS — I5022 Chronic systolic (congestive) heart failure: Secondary | ICD-10-CM | POA: Diagnosis present

## 2013-04-30 DIAGNOSIS — IMO0002 Reserved for concepts with insufficient information to code with codable children: Secondary | ICD-10-CM

## 2013-04-30 DIAGNOSIS — K219 Gastro-esophageal reflux disease without esophagitis: Secondary | ICD-10-CM | POA: Diagnosis present

## 2013-04-30 DIAGNOSIS — R5381 Other malaise: Secondary | ICD-10-CM

## 2013-04-30 DIAGNOSIS — I739 Peripheral vascular disease, unspecified: Secondary | ICD-10-CM

## 2013-04-30 LAB — BASIC METABOLIC PANEL
BUN: 18 mg/dL (ref 6–23)
CO2: 24 mEq/L (ref 19–32)
Calcium: 8.5 mg/dL (ref 8.4–10.5)
Chloride: 103 mEq/L (ref 96–112)
Creatinine, Ser: 0.71 mg/dL (ref 0.50–1.35)
Glucose, Bld: 124 mg/dL — ABNORMAL HIGH (ref 70–99)
POTASSIUM: 3.2 meq/L — AB (ref 3.7–5.3)
SODIUM: 142 meq/L (ref 137–147)

## 2013-04-30 LAB — CBC
HEMATOCRIT: 31.2 % — AB (ref 39.0–52.0)
Hemoglobin: 10.1 g/dL — ABNORMAL LOW (ref 13.0–17.0)
MCH: 27.2 pg (ref 26.0–34.0)
MCHC: 32.4 g/dL (ref 30.0–36.0)
MCV: 84.1 fL (ref 78.0–100.0)
Platelets: 157 10*3/uL (ref 150–400)
RBC: 3.71 MIL/uL — ABNORMAL LOW (ref 4.22–5.81)
RDW: 19.1 % — AB (ref 11.5–15.5)
WBC: 7.9 10*3/uL (ref 4.0–10.5)

## 2013-04-30 LAB — GLUCOSE, CAPILLARY
GLUCOSE-CAPILLARY: 153 mg/dL — AB (ref 70–99)
GLUCOSE-CAPILLARY: 186 mg/dL — AB (ref 70–99)
GLUCOSE-CAPILLARY: 203 mg/dL — AB (ref 70–99)
GLUCOSE-CAPILLARY: 256 mg/dL — AB (ref 70–99)
Glucose-Capillary: 125 mg/dL — ABNORMAL HIGH (ref 70–99)
Glucose-Capillary: 164 mg/dL — ABNORMAL HIGH (ref 70–99)
Glucose-Capillary: 208 mg/dL — ABNORMAL HIGH (ref 70–99)

## 2013-04-30 LAB — PROTIME-INR
INR: 1.61 — AB (ref 0.00–1.49)
PROTHROMBIN TIME: 18.7 s — AB (ref 11.6–15.2)

## 2013-04-30 MED ORDER — NITROGLYCERIN 0.4 MG SL SUBL: 0.4000 mg | SUBLINGUAL_TABLET | SUBLINGUAL | Status: DC | PRN

## 2013-04-30 MED ORDER — ENSURE COMPLETE PO LIQD
237.0000 mL | Freq: Three times a day (TID) | ORAL | Status: DC
  Administered 2013-04-30 – 2013-05-02 (×5): 237 mL via ORAL

## 2013-04-30 MED ORDER — GABAPENTIN 300 MG PO CAPS
300.0000 mg | ORAL_CAPSULE | Freq: Two times a day (BID) | ORAL | Status: DC
  Administered 2013-04-30 – 2013-05-05 (×11): 300 mg via ORAL
  Filled 2013-04-30 (×12): qty 1

## 2013-04-30 MED ORDER — LINAGLIPTIN 5 MG PO TABS
5.0000 mg | ORAL_TABLET | Freq: Every day | ORAL | Status: DC
  Administered 2013-04-30 – 2013-05-05 (×6): 5 mg via ORAL
  Filled 2013-04-30 (×6): qty 1

## 2013-04-30 MED ORDER — PANTOPRAZOLE SODIUM 40 MG PO TBEC
40.0000 mg | DELAYED_RELEASE_TABLET | Freq: Two times a day (BID) | ORAL | Status: DC
  Administered 2013-04-30 – 2013-05-05 (×11): 40 mg via ORAL
  Filled 2013-04-30 (×9): qty 1

## 2013-04-30 MED ORDER — LOSARTAN POTASSIUM 25 MG PO TABS
25.0000 mg | ORAL_TABLET | Freq: Every day | ORAL | Status: DC
  Administered 2013-04-30 – 2013-05-05 (×6): 25 mg via ORAL
  Filled 2013-04-30 (×6): qty 1

## 2013-04-30 MED ORDER — GLYBURIDE 5 MG PO TABS
5.0000 mg | ORAL_TABLET | Freq: Two times a day (BID) | ORAL | Status: DC
  Administered 2013-04-30 – 2013-05-05 (×10): 5 mg via ORAL
  Filled 2013-04-30 (×12): qty 1

## 2013-04-30 MED ORDER — WARFARIN SODIUM 5 MG PO TABS
5.0000 mg | ORAL_TABLET | Freq: Once | ORAL | Status: AC
  Administered 2013-04-30: 5 mg via ORAL
  Filled 2013-04-30: qty 1

## 2013-04-30 MED ORDER — PREDNISONE 5 MG PO TABS
5.0000 mg | ORAL_TABLET | Freq: Two times a day (BID) | ORAL | Status: DC
  Administered 2013-04-30 – 2013-05-05 (×11): 5 mg via ORAL
  Filled 2013-04-30 (×12): qty 1

## 2013-04-30 MED ORDER — ISOSORBIDE MONONITRATE ER 30 MG PO TB24
30.0000 mg | ORAL_TABLET | Freq: Every day | ORAL | Status: DC
  Administered 2013-04-30 – 2013-05-05 (×6): 30 mg via ORAL
  Filled 2013-04-30 (×6): qty 1

## 2013-04-30 MED ORDER — EZETIMIBE 10 MG PO TABS
10.0000 mg | ORAL_TABLET | Freq: Every day | ORAL | Status: DC
  Administered 2013-04-30 – 2013-05-04 (×5): 10 mg via ORAL
  Filled 2013-04-30 (×6): qty 1

## 2013-04-30 MED ORDER — LEVOFLOXACIN 500 MG PO TABS
500.0000 mg | ORAL_TABLET | Freq: Every day | ORAL | Status: DC
  Administered 2013-04-30 – 2013-05-05 (×6): 500 mg via ORAL
  Filled 2013-04-30 (×6): qty 1

## 2013-04-30 MED ORDER — GABAPENTIN 600 MG PO TABS
300.0000 mg | ORAL_TABLET | Freq: Two times a day (BID) | ORAL | Status: DC
  Filled 2013-04-30 (×2): qty 0.5

## 2013-04-30 MED ORDER — POTASSIUM CHLORIDE CRYS ER 20 MEQ PO TBCR
40.0000 meq | EXTENDED_RELEASE_TABLET | Freq: Every day | ORAL | Status: DC
  Administered 2013-04-30 – 2013-05-01 (×2): 40 meq via ORAL
  Filled 2013-04-30 (×2): qty 2

## 2013-04-30 MED ORDER — CALCIUM CARBONATE ANTACID 500 MG PO CHEW
400.0000 mg | CHEWABLE_TABLET | Freq: Four times a day (QID) | ORAL | Status: DC | PRN
  Administered 2013-04-30: 400 mg via ORAL
  Filled 2013-04-30: qty 2

## 2013-04-30 MED ORDER — INSULIN ASPART 100 UNIT/ML ~~LOC~~ SOLN
0.0000 [IU] | Freq: Three times a day (TID) | SUBCUTANEOUS | Status: DC
  Administered 2013-04-30: 11 [IU] via SUBCUTANEOUS
  Administered 2013-05-01: 3 [IU] via SUBCUTANEOUS
  Administered 2013-05-01 – 2013-05-02 (×2): 4 [IU] via SUBCUTANEOUS
  Administered 2013-05-02: 3 [IU] via SUBCUTANEOUS
  Administered 2013-05-03: 7 [IU] via SUBCUTANEOUS
  Administered 2013-05-03: 3 [IU] via SUBCUTANEOUS
  Administered 2013-05-03: 4 [IU] via SUBCUTANEOUS
  Administered 2013-05-04: 3 [IU] via SUBCUTANEOUS
  Administered 2013-05-05: 4 [IU] via SUBCUTANEOUS

## 2013-04-30 NOTE — Progress Notes (Addendum)
   VASCULAR PROGRESS NOTE  SUBJECTIVE: No complaints.  PHYSICAL EXAM: Filed Vitals:   04/29/13 1809 04/29/13 2023 04/29/13 2027 04/30/13 0500  BP: 118/75 129/77  147/88  Pulse: 81 67  58  Temp: 98.4 F (36.9 C) 98.6 F (37 C)  97.4 F (36.3 C)  TempSrc: Oral Oral  Oral  Resp: 20 18  18   Height:      Weight:   213 lb 13.5 oz (97 kg)   SpO2: 98% 97%  97%   Dressing changed this morning. There is some granulation tissue. The vein patch is not exposed. There is minimal drainage. The wound on the right lateral malleolus is gradually improving.  LABS: Lab Results  Component Value Date   WBC 8.5 04/28/2013   HGB 8.9* 04/28/2013   HCT 27.1* 04/28/2013   MCV 83.4 04/28/2013   PLT 93* 04/28/2013   Lab Results  Component Value Date   CREATININE 0.88 04/28/2013   Lab Results  Component Value Date   INR 1.53* 04/29/2013   CBG (last 3)   Recent Labs  04/29/13 1705 04/30/13 0001 04/30/13 0401  GLUCAP 174* 186* 153*    Principal Problem:   Septic shock Active Problems:   Shock   ASSESSMENT AND PLAN:  * 4 Days Post-Op s/p: Debridement of right groin and replacement of Bovine patch with vein patch.   * On IV Unasyn and Vanco (cultures showed staph aureus sensitive to everything tested. He could eventually be changed to Levaquin)  * I think that the wound would do better if it is kept moist and therefore have written for dressing changes with hydrogel.   * On Coumadin for A. Fib  * Continues physical therapy    06/28/13 Beeper: Cari Caraway 04/30/2013

## 2013-04-30 NOTE — Progress Notes (Signed)
[facility] PROGRESS NOTE  MAXAMILIAN AMADON GMW:[id number] DOB: [birth year] DOA: 04/25/2013 PCP: Gaspar Skeeters, MD  Chart reviewed. Assessment/Plan: 63 y.o. male w/ PMHx of CAD s/p CABG, CHF w/ AICD/PPM (04/06/12), A-fib (on Coumadin) PAD s/p R. Femoral endarterectomy and R/ Iliac stent placement, CKD, HTN, HLD, DM type II, transferred from [facility] ED w/ likely septic shock 2/2 bacteremia and acute respiratory failure requiring BiPAP->intubated.   1. Acute respiratory failure extubated (1/30) no re tube/DNR -resolved, cont prn bronchodilators, oxygen;   2. Septic shock septic shock, recent right iliac endarderectomy, s/p debridement, wound c/s: MSSA -per VSS: Groin exploration and debridement and removal of bovine pericardial patch and placement of vein patch, saphenous vein. Change to po abx. Resume home pred and d/c hydrocortisone. Saline lock. D/c tele   3. DM: resume glyburide, januvia. D/c lantus to avoid hypoglycemia off stress dose steroids  4. CHF/AICD/PPM (04/06/12) echo (1/28): LVEF 20-25%, Diffuse hypokinesis, dilated cardiomyopathy  On coreg at lower dose. Resume ARB, lower dose, compensated  5. A fib on coumadin, HR uncontrolled with sepsis - resume dig, BB, coumadin;   6. CAD s/p CABG resume BB, ASA, Plavix; monitor   Hypokalemia: replete  GERD: change to po PPI  Resume home meds  Debility: refuses home health at this time.  Will discuss again tomorrow. Add ensure  Code Status: DNR Family Communication: multiple at bedside Disposition Plan: home   Consultants:  VSS SIGNIFICANT EVENTS / STUDIES:  1/27 - Transferred from [facility] ED w/ hypotension  1/27 - L. Femoral line placed, started on Levophed  1/28 OR R groin exploration and debridement w/ removal of bovine pericardial patch and replacement of vein patch, saphenous vein  LINES / TUBES:  L. Femoral CVL 1/27 >>>1/28  RIJ 1/28 >>>>  OETT 1/28 >>>  CULTURES:  Blood 1/27 >>>canceled  R.  Femoral wound 1/27 >>> Moderate Staph Aureus  R femoral wound 1/28 >>> 1/2 R G+ cocci in pairs  R femoral wound drainage 1/28 >>> Moderate stap aureus  ANTIBIOTICS:  Vancomycin 1/27 >>>  Cefepime 1/27 >>>1/30  Flagyl 1/28 >>>1/30  unasyn 1/30>>> HPI/Subjective: Weak. Poor appetite. Has had poor experience with home health in the past.  Objective: Filed Vitals:   04/30/13 1320  BP: 151/84  Pulse: 86  Temp: 97.6 F (36.4 C)  Resp: 21    Intake/Output Summary (Last 24 hours) at 04/30/13 1345 Last data filed at 04/30/13 0700  Gross per 24 hour  Intake    540 ml  Output   1476 ml  Net   -936 ml   Filed Weights   04/28/13 0500 04/29/13 0500 04/29/13 2027  Weight: 97.7 kg (215 lb 6.2 oz) 98 kg (216 lb 0.8 oz) 97 kg (213 lb 13.5 oz)    Exam:   General:  Alert, in chair. nontoxic  Cardiovascular: s1,s2 rrr  Respiratory: CTA without WRR  Abdomen: soft, nt, nd   Musculoskeletal: no edema   Data Reviewed: Basic Metabolic Panel:  Recent Labs Lab 04/26/13 0345  04/26/13 1730 04/27/13 0440 04/27/13 1800 04/28/13 0425 04/30/13 0650  NA 134*  < > 136* 139 137 139 142  K 5.0  < > 4.3 3.6* 4.3 4.9 3.2*  CL 98  < > 103 106 105 105 103  CO2 17*  < > 18* 19 19 19 24   GLUCOSE 388*  < > 229* 139* 99 198* 124*  BUN 22  < > 25* 25* 18 23 18   CREATININE 1.74*  < > 1.25 1.02  0.82 0.88 0.71  CALCIUM 7.5*  < > 7.3* 7.5* 7.8* 8.0* 8.5  MG 1.3*  --   --  2.6*  --   --   --   < > = values in this interval not displayed. Liver Function Tests:  Recent Labs Lab 04/26/13 0345 04/26/13 0930  AST 59* 80*  ALT 28 38  ALKPHOS 72 86  BILITOT 1.1 1.7*  PROT 5.5* 6.1  ALBUMIN 2.4* 2.6*   No results found for this basename: LIPASE, AMYLASE,  in the last 168 hours No results found for this basename: AMMONIA,  in the last 168 hours CBC:  Recent Labs Lab 04/26/13 0345 04/26/13 1443 04/26/13 1451 04/27/13 0440 04/28/13 0425 04/30/13 0650  WBC 15.7*  --   --  8.1 8.5 7.9   HGB 9.7* 8.2* 9.2* 8.7* 8.9* 10.1*  HCT 30.2* 24.0* 27.0* 26.6* 27.1* 31.2*  MCV 83.0  --   --  83.1 83.4 84.1  PLT 173  --   --  100* 93* 157   Cardiac Enzymes:  Recent Labs Lab 04/26/13 0645 04/26/13 0943 04/26/13 1730 04/27/13 0755  TROPONINI 0.41* 0.45* 0.53* 0.34*   BNP (last 3 results)  Recent Labs  03/16/13 0405 03/19/13 0430 04/25/13 2205  PROBNP 1650.0* 2143.0* 8808.0*   CBG:  Recent Labs Lab 04/29/13 1705 04/30/13 0001 04/30/13 0401 04/30/13 0751 04/30/13 1129  GLUCAP 174* 186* 153* 125* 208*    Recent Results (from the past 240 hour(s))  WOUND CULTURE     Status: None   Collection Time    04/25/13 10:10 PM      Result Value Range Status   Specimen Description WOUND GROIN RIGHT   Final   Special Requests Normal   Final   Gram Stain     Final   Value: NO WBC SEEN     NO SQUAMOUS EPITHELIAL CELLS SEEN     NO ORGANISMS SEEN     Performed at [facility]   Culture     Final   Value: MODERATE STAPHYLOCOCCUS AUREUS     Note: RIFAMPIN AND GENTAMICIN SHOULD NOT BE USED AS SINGLE DRUGS FOR TREATMENT OF STAPH INFECTIONS.     Performed at [facility]   Report Status 04/28/2013 FINAL   Final   Organism ID, Bacteria STAPHYLOCOCCUS AUREUS   Final  WOUND CULTURE     Status: None   Collection Time    04/26/13 12:05 AM      Result Value Range Status   Specimen Description WOUND RIGHT GROIN DRAINAGE   Final   Special Requests Normal   Final   Gram Stain     Final   Value: NO WBC SEEN     NO SQUAMOUS EPITHELIAL CELLS SEEN     NO ORGANISMS SEEN     Performed at [facility]   Culture     Final   Value: MODERATE STAPHYLOCOCCUS AUREUS     Note: RIFAMPIN AND GENTAMICIN SHOULD NOT BE USED AS SINGLE DRUGS FOR TREATMENT OF STAPH INFECTIONS.     Performed at [facility]   Report Status 04/28/2013 FINAL   Final   Organism ID, Bacteria STAPHYLOCOCCUS AUREUS   Final  MRSA PCR SCREENING     Status: None   Collection Time     04/26/13  1:48 AM      Result Value Range Status   MRSA by PCR NEGATIVE  NEGATIVE Final   Comment:  The GeneXpert MRSA Assay (FDA     approved for NASAL specimens     only), is one component of a     comprehensive MRSA colonization     surveillance program. It is not     intended to diagnose MRSA     infection nor to guide or     monitor treatment for     MRSA infections.  WOUND CULTURE     Status: None   Collection Time    04/26/13  1:43 PM      Result Value Range Status   Specimen Description WOUND RIGHT GROIN   Final   Special Requests SPECIMEN NO.1 PT ON FLAGYL,VANCOMYCIN,MAXIPINE   Final   Gram Stain     Final   Value: NO WBC SEEN     NO SQUAMOUS EPITHELIAL CELLS SEEN     NO ORGANISMS SEEN     Performed at [facility]   Culture     Final   Value: FEW STAPHYLOCOCCUS AUREUS     Note: RIFAMPIN AND GENTAMICIN SHOULD NOT BE USED AS SINGLE DRUGS FOR TREATMENT OF STAPH INFECTIONS.     Performed at [facility]   Report Status 04/29/2013 FINAL   Final   Organism ID, Bacteria STAPHYLOCOCCUS AUREUS   Final  WOUND CULTURE     Status: None   Collection Time    04/26/13  1:46 PM      Result Value Range Status   Specimen Description WOUND RIGHT GROIN   Final   Special Requests SPECIMEN NO. 2 PT ON FLAGYL,VANCOMYCIN,MAXIPINE   Final   Gram Stain     Final   Value: RARE WBC PRESENT,BOTH PMN AND MONONUCLEAR     NO SQUAMOUS EPITHELIAL CELLS SEEN     RARE GRAM POSITIVE COCCI IN PAIRS     Performed at [facility]   Culture     Final   Value: MODERATE STAPHYLOCOCCUS AUREUS     Note: RIFAMPIN AND GENTAMICIN SHOULD NOT BE USED AS SINGLE DRUGS FOR TREATMENT OF STAPH INFECTIONS.     Performed at [facility]   Report Status 04/29/2013 FINAL   Final   Organism ID, Bacteria STAPHYLOCOCCUS AUREUS   Final  CLOSTRIDIUM DIFFICILE BY PCR     Status: None   Collection Time    04/28/13  2:00 PM      Result Value Range Status   C difficile by pcr  NEGATIVE  NEGATIVE Final     Studies: No results found.  Scheduled Meds: . ampicillin-sulbactam (UNASYN) IV  1.5 g Intravenous Q6H  . antiseptic oral rinse  15 mL Mouth Rinse QID  . aspirin EC  81 mg Oral Daily  . carvedilol  6.25 mg Oral BID WC  . chlorhexidine  15 mL Mouth Rinse BID  . clopidogrel  75 mg Oral Q breakfast  . digoxin  0.125 mg Oral Daily  . furosemide  40 mg Oral Daily  . hydrocortisone sod succinate (SOLU-CORTEF) inj  25 mg Intravenous Q12H  . insulin aspart  0-20 Units Subcutaneous Q4H  . insulin glargine  40 Units Subcutaneous Q24H  . pantoprazole (PROTONIX) IV  40 mg Intravenous QHS  . warfarin  5 mg Oral ONCE-1800  . Warfarin - Pharmacist Dosing Inpatient   Does not apply q1800   Continuous Infusions: . dextrose 5 % and 0.45% NaCl 20 mL/hr (04/29/13 1256)   Time spent: >35 minutes   Talisa Petrak L  [facility] Pager [phone]. If 7PM-7AM,  please contact night-coverage at [email or web address], password [facility] 04/30/2013, 1:45 PM  LOS: 5 days

## 2013-04-30 NOTE — Progress Notes (Signed)
ANTICOAGULATION CONSULT NOTE - Follow Up Consult  Pharmacy Consult for coumadin Indication: atrial fibrillation  Allergies  Allergen Reactions  . Lipitor [Atorvastatin] Other (See Comments)    Weak muscles  . Lodine [Etodolac] Nausea And Vomiting  . Methotrexate Derivatives Nausea And Vomiting  . Remicade [Infliximab] Other (See Comments)    Chills and shakes   . Ranitidine Hcl Nausea Only    Patient Measurements: Height: 5' 8.9" (175 cm) Weight: 213 lb 13.5 oz (97 kg) IBW/kg (Calculated) : 70.47  Vital Signs: Temp: 97.7 F (36.5 C) (02/01 0735) Temp src: Oral (02/01 0735) BP: 179/103 mmHg (02/01 0735) Pulse Rate: 99 (02/01 0735)  Labs:  Recent Labs  04/27/13 1800 04/28/13 0425 04/29/13 1359 04/30/13 0650  HGB  --  8.9*  --  10.1*  HCT  --  27.1*  --  31.2*  PLT  --  93*  --  157  LABPROT  --   --  18.0* 18.7*  INR  --   --  1.53* 1.61*  CREATININE 0.82 0.88  --   --     Estimated Creatinine Clearance: 102.4 ml/min (by C-G formula based on Cr of 0.88).   Medical History: Past Medical History  Diagnosis Date  . Diabetes mellitus   . Hypertension   . Hyperlipidemia   . Rheumatoid arthritis   . CAD (coronary artery disease)   . Myocardial infarction 2000 & 2006  . GERD (gastroesophageal reflux disease)   . Leg pain   . CHF (congestive heart failure)   . Chronic kidney disease   . PAD (peripheral artery disease)   . Decubitus ulcer of right ankle   . 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 Jude - single chamber ICD  . Pacemaker     Medications:  Scheduled:  . ampicillin-sulbactam (UNASYN) IV  1.5 g Intravenous Q6H  . antiseptic oral rinse  15 mL Mouth Rinse QID  . aspirin EC  81 mg Oral Daily  . carvedilol  6.25 mg Oral BID WC  . chlorhexidine  15 mL Mouth Rinse BID  . clopidogrel  75 mg Oral Q breakfast  . digoxin  0.125 mg Oral Daily  . furosemide  40 mg Oral Daily  . hydrocortisone sod succinate  (SOLU-CORTEF) inj  25 mg Intravenous Q12H  . insulin aspart  0-20 Units Subcutaneous Q4H  . insulin glargine  40 Units Subcutaneous Q24H  . pantoprazole (PROTONIX) IV  40 mg Intravenous QHS  . vancomycin  1,000 mg Intravenous Q8H  . Warfarin - Pharmacist Dosing Inpatient   Does not apply q1800   Assessment: 63 yo M admitted on 1/27 for a poorly healing right groin wound and sepsis. He is on chronic coumadin therapy for atrial fibrillation though it has been held during this admission. Patient is s/p I & D, and coumadin was restarted per pharmacy yesterday.  Patient's home dose is 5 mg daily. Patient states he has been stable on this dose for a "long time".   INR today is subtherapeutic at 1.61, though patient has only received one dose of coumadin since restarting therapy.  CBC improved: Hgb 10.1, platelet count 157. No bleeding issues noted.  Mr. Banka is also on Plavix and aspirin.  Goal of Therapy:  INR 2-3 Monitor platelets by anticoagulation protocol: Yes   Plan:  - Repeat coumadin 5mg  PO x 1 dose tonight - Daily INR - F/U bleeding complications  Asuka Dusseau C. Heriberto Stmartin, PharmD Clinical Pharmacist-Resident Pager: [phone] Pharmacy: [phone]  04/30/2013 8:16 AM

## 2013-05-01 DIAGNOSIS — R110 Nausea: Secondary | ICD-10-CM | POA: Diagnosis present

## 2013-05-01 DIAGNOSIS — K219 Gastro-esophageal reflux disease without esophagitis: Secondary | ICD-10-CM

## 2013-05-01 LAB — GLUCOSE, CAPILLARY
GLUCOSE-CAPILLARY: 139 mg/dL — AB (ref 70–99)
GLUCOSE-CAPILLARY: 159 mg/dL — AB (ref 70–99)
Glucose-Capillary: 125 mg/dL — ABNORMAL HIGH (ref 70–99)
Glucose-Capillary: 187 mg/dL — ABNORMAL HIGH (ref 70–99)
Glucose-Capillary: 178 mg/dL — ABNORMAL HIGH (ref 70–99)
Glucose-Capillary: 122 mg/dL — ABNORMAL HIGH (ref 70–99)

## 2013-05-01 LAB — MAGNESIUM: Magnesium: 1.6 mg/dL (ref 1.5–2.5)

## 2013-05-01 LAB — BASIC METABOLIC PANEL
BUN: 13 mg/dL (ref 6–23)
CO2: 27 meq/L (ref 19–32)
CREATININE: 0.67 mg/dL (ref 0.50–1.35)
Calcium: 8.4 mg/dL (ref 8.4–10.5)
Chloride: 104 mEq/L (ref 96–112)
GFR calc Af Amer: >90 mL/min (ref >90)
GFR calc non Af Amer: >90 mL/min (ref >90)
Glucose, Bld: 145 mg/dL — ABNORMAL HIGH (ref 70–99)
Potassium: 3.3 mEq/L — ABNORMAL LOW (ref 3.7–5.3)
Sodium: 144 mEq/L (ref 137–147)

## 2013-05-01 LAB — PROTIME-INR
INR: 1.62 — ABNORMAL HIGH (ref 0.00–1.49)
Prothrombin Time: 18.8 seconds — ABNORMAL HIGH (ref 11.6–15.2)

## 2013-05-01 MED ORDER — ONDANSETRON HCL 4 MG/2ML IJ SOLN: 4.0000 mg | Freq: Four times a day (QID) | INTRAMUSCULAR | Status: DC | PRN

## 2013-05-01 MED ORDER — POTASSIUM CHLORIDE CRYS ER 20 MEQ PO TBCR
40.0000 meq | EXTENDED_RELEASE_TABLET | Freq: Three times a day (TID) | ORAL | Status: AC
  Administered 2013-05-01 – 2013-05-03 (×6): 40 meq via ORAL
  Filled 2013-05-01 (×7): qty 2

## 2013-05-01 MED ORDER — WARFARIN SODIUM 7.5 MG PO TABS
7.5000 mg | ORAL_TABLET | Freq: Once | ORAL | Status: AC
  Administered 2013-05-01: 7.5 mg via ORAL
  Filled 2013-05-01: qty 1

## 2013-05-01 NOTE — Progress Notes (Signed)
Patient up ambulating, then up in chair had episode of nausea," just not feeling right".  102/68, 102, 24 97.3-temp, 98% SAT.  Blood sugar 178, Dr. Lendell Caprice notified.

## 2013-05-01 NOTE — Progress Notes (Signed)
ANTICOAGULATION CONSULT NOTE - Follow Up Consult  Pharmacy Consult for coumadin Indication: atrial fibrillation  Allergies  Allergen Reactions  . Lipitor [Atorvastatin] Other (See Comments)    Weak muscles  . Lodine [Etodolac] Nausea And Vomiting  . Methotrexate Derivatives Nausea And Vomiting  . Remicade [Infliximab] Other (See Comments)    Chills and shakes   . Ranitidine Hcl Nausea Only    Patient Measurements: Height: 5' 8.9" (175 cm) Weight: 214 lb 15.2 oz (97.5 kg) IBW/kg (Calculated) : 70.47  Vital Signs: Temp: 98.8 F (37.1 C) (02/02 1035) Temp src: Oral (02/02 1035) BP: 119/78 mmHg (02/02 1035) Pulse Rate: 83 (02/02 1035)  Labs:  Recent Labs  04/29/13 1359 04/30/13 0650 05/01/13 0615  HGB  --  10.1*  --   HCT  --  31.2*  --   PLT  --  157  --   LABPROT 18.0* 18.7* 18.8*  INR 1.53* 1.61* 1.62*  CREATININE  --  0.71 0.67    Estimated Creatinine Clearance: 112.9 ml/min (by C-G formula based on Cr of 0.67).   Assessment: 63 yo M admitted on 1/27 for a poorly healing right groin wound and sepsis. He is on chronic coumadin therapy for atrial fibrillation though it has been held during this admission. Patient is s/p I & D, and coumadin was restarted per pharmacy.   Patient's home dose is 5 mg daily. Patient states he has been stable on this dose for a "long time".   INR today remains subtherapeutic at 1.62, though patient has only received two dose of coumadin since restarting therapy.  CBC improved: Hgb 10.1, platelet count 157. No bleeding issues noted.  [name] is also on Plavix and aspirin.  Goal of Therapy:  INR 2-3 Monitor platelets by anticoagulation protocol: Yes   Plan:  - Coumadin 7.5mg  PO x 1 dose tonight - If plan is for patient to be discharged today, give 7.5 mg today and then 5 mg daily starting tomorrow. Would recommend f/u INR check no later than Friday. - Daily INR - F/U bleeding complications  Vinnie Level, PharmD.   Clinical Pharmacist Pager [phone]

## 2013-05-01 NOTE — Progress Notes (Signed)
Inpatient Diabetes Program Recommendations  AACE/ADA: New Consensus Statement on Inpatient Glycemic Control (2013)  Target Ranges:  Prepandial:   less than 140 mg/dL      Peak postprandial:   less than 180 mg/dL (1-2 hours)      Critically ill patients:  140 - 180 mg/dL    **Received referral for this patient.  Wife of patient has questions about patient's DM care at home.  **Spoke with patient and his wife about patient's DM regimen at home.  Patient was taking Glucovance 5/500 2 in the AM and 1 in the PM and Januvia 100 mg QHS at home.  Noted Dr. Lendell Caprice d/c'd Lantus yesterday and we are currently managing patient with Novolog Resistant SSI + Tradgenta + Glyburide.  Explained recent insulin adjustments with patient and wife and also explained that most patients will receive Novolog SSI here in hospital.  Patient and wife asked me if they will be d/c'd home on insulin.  Explained that the MD will need to make that decision, but as of now, patient will likely go home on oral medications.    **Reviewed s/sxs of hypoglycemia and hyperglycemia and how to treat at home.  Encouraged patient to check his CBGs at least bid at home and to also check when he has s/sxs of high and low CBGs.  Encouraged patient to keep a written log of his CBGs and to share his CBG logbook with his PCP (Dr. Jola Schmidt in [place], [place]).  Also reviewed basic carbohydrate counting principles with patient and his wife and explained how to read a food label with them.  Written materials given to patient's wife for review regarding hypo and hyperglycemia and food label reading.  Encouraged patient to call his MD if he has CBG readings consistently <70 or consistently >200 mg/dl at home.   Will follow. Ambrose Finland RN, MSN, CDE Diabetes Coordinator Inpatient Diabetes Program Team Pager: [phone] (8a-10p)

## 2013-05-01 NOTE — Progress Notes (Signed)
[facility] PROGRESS NOTE  [name] [name] IEP:[id number] DOB: Nov 23, 1950 DOA: 04/25/2013 PCP: Gaspar Skeeters, MD   Assessment/Plan: 63 y.o. male w/ PMHx of CAD s/p CABG, CHF w/ AICD/PPM (04/06/12), A-fib (on Coumadin) PAD s/p R. Femoral endarterectomy and R/ Iliac stent placement, CKD, HTN, HLD, DM type II, transferred from [facility] ED w/ likely septic shock 2/2 bacteremia and acute respiratory failure requiring BiPAP->intubated.   1. Acute respiratory failure extubated (1/30) no re tube/DNR -resolved, cont prn bronchodilators, oxygen;   2. Septic shock septic shock, recent right iliac endarderectomy, s/p debridement, wound c/s: MSSA -per VSS: Groin exploration and debridement and removal of bovine pericardial patch and placement of vein patch, saphenous vein. Changed to po abx 2/1.    3. DM: resume glyburide, januvia. stable  4. CHF/AICD/PPM (04/06/12) echo (1/28): LVEF 20-25%, Diffuse hypokinesis, dilated cardiomyopathy  On coreg at lower dose ARB, lower dose. compensated  5. A fib on coumadin, HR uncontrolled with sepsis - resume dig, BB, coumadin;   6. CAD s/p CABG resume BB, ASA, Plavix; monitor   Hypokalemia: replete  GERD: change to po PPI  Nausea: Add Zofran as needed. May be related to Levaquin. Monitor. Check digoxin level  Debility: Agrees to home health R.N. but declines home physical therapist. "I have no room".  Code Status: DNR Family Communication: multiple at bedside Disposition Plan: home   Consultants:  VSS SIGNIFICANT EVENTS / STUDIES:  1/27 - Transferred from [facility] ED w/ hypotension  1/27 - L. Femoral line placed, started on Levophed  1/28 OR R groin exploration and debridement w/ removal of bovine pericardial patch and replacement of vein patch, saphenous vein  LINES / TUBES:  L. Femoral CVL 1/27 >>>1/28  RIJ 1/28 >>>>  OETT 1/28 >>>  CULTURES:  Blood 1/27 >>>canceled  R. Femoral wound 1/27 >>> Moderate Staph Aureus  R  femoral wound 1/28 >>> 1/2 R G+ cocci in pairs  R femoral wound drainage 1/28 >>> Moderate stap aureus  ANTIBIOTICS:  Vancomycin 1/27 >>> 2/1 Cefepime 1/27 >>>1/30  Flagyl 1/28 >>>1/30  unasyn 1/30>>>2/1 levaquin 2/1  HPI/Subjective: Felt nauseated this morning while walking with PT and while eating, felt flushed. No bleeding noted. No dyspnea. No chest pain. Now agreeable to home health.  Objective: Filed Vitals:   05/01/13 1433  BP: 102/68  Pulse: 102  Temp: 97.3 F (36.3 C)  Resp: 24   No intake or output data in the 24 hours ending 05/01/13 1508 Filed Weights   04/29/13 0500 04/29/13 2027 04/30/13 2045  Weight: 98 kg (216 lb 0.8 oz) 97 kg (213 lb 13.5 oz) 97.5 kg (214 lb 15.2 oz)    Exam:   General:  Alert, in chair. Slightly pale  Cardiovascular: s1,s2 rrr  Respiratory: CTA without WRR  Abdomen: soft, nt, nd   Musculoskeletal: 1+ edema. Right groin wound with ABD. No drainage noted.  Data Reviewed: Basic Metabolic Panel:  Recent Labs Lab 04/26/13 0345  04/27/13 0440 04/27/13 1800 04/28/13 0425 04/30/13 0650 05/01/13 0615  NA 134*  < > 139 137 139 142 144  K 5.0  < > 3.6* 4.3 4.9 3.2* 3.3*  CL 98  < > 106 105 105 103 104  CO2 17*  < > 19 19 19 24 27   GLUCOSE 388*  < > 139* 99 198* 124* 145*  BUN 22  < > 25* 18 23 18 13   CREATININE 1.74*  < > 1.02 0.82 0.88 0.71 0.67  CALCIUM 7.5*  < > 7.5* 7.8*  8.0* 8.5 8.4  MG 1.3*  --  2.6*  --   --   --  1.6  < > = values in this interval not displayed. Liver Function Tests:  Recent Labs Lab 04/26/13 0345 04/26/13 0930  AST 59* 80*  ALT 28 38  ALKPHOS 72 86  BILITOT 1.1 1.7*  PROT 5.5* 6.1  ALBUMIN 2.4* 2.6*   No results found for this basename: LIPASE, AMYLASE,  in the last 168 hours No results found for this basename: AMMONIA,  in the last 168 hours CBC:  Recent Labs Lab 04/26/13 0345 04/26/13 1443 04/26/13 1451 04/27/13 0440 04/28/13 0425 04/30/13 0650  WBC 15.7*  --   --  8.1 8.5 7.9   HGB 9.7* 8.2* 9.2* 8.7* 8.9* 10.1*  HCT 30.2* 24.0* 27.0* 26.6* 27.1* 31.2*  MCV 83.0  --   --  83.1 83.4 84.1  PLT 173  --   --  100* 93* 157   Cardiac Enzymes:  Recent Labs Lab 04/26/13 0645 04/26/13 0943 04/26/13 1730 04/27/13 0755  TROPONINI 0.41* 0.45* 0.53* 0.34*   BNP (last 3 results)  Recent Labs  03/16/13 0405 03/19/13 0430 04/25/13 2205  PROBNP 1650.0* 2143.0* 8808.0*   CBG:  Recent Labs Lab 04/30/13 2356 05/01/13 0354 05/01/13 0826 05/01/13 1242 05/01/13 1426  GLUCAP 164* 139* 125* 187* 178*    Recent Results (from the past 240 hour(s))  WOUND CULTURE     Status: None   Collection Time    04/25/13 10:10 PM      Result Value Range Status   Specimen Description WOUND GROIN RIGHT   Final   Special Requests Normal   Final   Gram Stain     Final   Value: NO WBC SEEN     NO SQUAMOUS EPITHELIAL CELLS SEEN     NO ORGANISMS SEEN     Performed at [facility]   Culture     Final   Value: MODERATE STAPHYLOCOCCUS AUREUS     Note: RIFAMPIN AND GENTAMICIN SHOULD NOT BE USED AS SINGLE DRUGS FOR TREATMENT OF STAPH INFECTIONS.     Performed at [facility]   Report Status 04/28/2013 FINAL   Final   Organism ID, Bacteria STAPHYLOCOCCUS AUREUS   Final  WOUND CULTURE     Status: None   Collection Time    04/26/13 12:05 AM      Result Value Range Status   Specimen Description WOUND RIGHT GROIN DRAINAGE   Final   Special Requests Normal   Final   Gram Stain     Final   Value: NO WBC SEEN     NO SQUAMOUS EPITHELIAL CELLS SEEN     NO ORGANISMS SEEN     Performed at [facility]   Culture     Final   Value: MODERATE STAPHYLOCOCCUS AUREUS     Note: RIFAMPIN AND GENTAMICIN SHOULD NOT BE USED AS SINGLE DRUGS FOR TREATMENT OF STAPH INFECTIONS.     Performed at [facility]   Report Status 04/28/2013 FINAL   Final   Organism ID, Bacteria STAPHYLOCOCCUS AUREUS   Final  MRSA PCR SCREENING     Status: None   Collection Time     04/26/13  1:48 AM      Result Value Range Status   MRSA by PCR NEGATIVE  NEGATIVE Final   Comment:            The GeneXpert MRSA Assay (FDA  approved for NASAL specimens     only), is one component of a     comprehensive MRSA colonization     surveillance program. It is not     intended to diagnose MRSA     infection nor to guide or     monitor treatment for     MRSA infections.  WOUND CULTURE     Status: None   Collection Time    04/26/13  1:43 PM      Result Value Range Status   Specimen Description WOUND RIGHT GROIN   Final   Special Requests SPECIMEN NO.1 PT ON FLAGYL,VANCOMYCIN,MAXIPINE   Final   Gram Stain     Final   Value: NO WBC SEEN     NO SQUAMOUS EPITHELIAL CELLS SEEN     NO ORGANISMS SEEN     Performed at [facility]   Culture     Final   Value: FEW STAPHYLOCOCCUS AUREUS     Note: RIFAMPIN AND GENTAMICIN SHOULD NOT BE USED AS SINGLE DRUGS FOR TREATMENT OF STAPH INFECTIONS.     Performed at [facility]   Report Status 04/29/2013 FINAL   Final   Organism ID, Bacteria STAPHYLOCOCCUS AUREUS   Final  WOUND CULTURE     Status: None   Collection Time    04/26/13  1:46 PM      Result Value Range Status   Specimen Description WOUND RIGHT GROIN   Final   Special Requests SPECIMEN NO. 2 PT ON FLAGYL,VANCOMYCIN,MAXIPINE   Final   Gram Stain     Final   Value: RARE WBC PRESENT,BOTH PMN AND MONONUCLEAR     NO SQUAMOUS EPITHELIAL CELLS SEEN     RARE GRAM POSITIVE COCCI IN PAIRS     Performed at [facility]   Culture     Final   Value: MODERATE STAPHYLOCOCCUS AUREUS     Note: RIFAMPIN AND GENTAMICIN SHOULD NOT BE USED AS SINGLE DRUGS FOR TREATMENT OF STAPH INFECTIONS.     Performed at [facility]   Report Status 04/29/2013 FINAL   Final   Organism ID, Bacteria STAPHYLOCOCCUS AUREUS   Final  CLOSTRIDIUM DIFFICILE BY PCR     Status: None   Collection Time    04/28/13  2:00 PM      Result Value Range Status   C difficile by pcr  NEGATIVE  NEGATIVE Final     Studies: No results found.  Scheduled Meds: . antiseptic oral rinse  15 mL Mouth Rinse QID  . aspirin EC  81 mg Oral Daily  . carvedilol  6.25 mg Oral BID WC  . chlorhexidine  15 mL Mouth Rinse BID  . clopidogrel  75 mg Oral Q breakfast  . digoxin  0.125 mg Oral Daily  . ezetimibe  10 mg Oral QHS  . feeding supplement (ENSURE COMPLETE)  237 mL Oral TID BM  . furosemide  40 mg Oral Daily  . gabapentin  300 mg Oral BID  . glyBURIDE  5 mg Oral BID WC  . insulin aspart  0-20 Units Subcutaneous TID WC  . isosorbide mononitrate  30 mg Oral Daily  . levofloxacin  500 mg Oral Daily  . linagliptin  5 mg Oral Daily  . losartan  25 mg Oral Daily  . pantoprazole  40 mg Oral BID  . potassium chloride SA  40 mEq Oral TID  . predniSONE  5 mg Oral BID  . warfarin  7.5 mg  Oral ONCE-1800  . Warfarin - Pharmacist Dosing Inpatient   Does not apply q1800   Continuous Infusions:   Time spent: >25 minutes   Lolamae Voisin L  [facility] Pager [phone]. If 7PM-7AM, please contact night-coverage at [email or web address], password [facility] 05/01/2013, 3:08 PM  LOS: 6 days

## 2013-05-01 NOTE — Progress Notes (Signed)
Physical Therapy Treatment Patient Details Name: [name] [name] MRN: [id number] DOB: 03/20/1951 Today's Date: 05/01/2013 Time: 1205-1229 PT Time Calculation (min): 24 min  PT Assessment / Plan / Recommendation  History of Present Illness Pt admitted for I&D of R groin abcess.  PMHx of CAD s/p CABG, CHF w/ AICD/PPM (04/06/12), A-fib (on Coumadin) PAD s/p R. Femoral endarterectomy and R/ Iliac stent placement, CKD, HTN, HLD, DM type II, transferred from [facility] ED w/ likely septic shock 2/2 bacteremia and acute respiratory failure requiring BiPAP->intubated   PT Comments   Progressing with able to ambulate in the hall.  Noted dressing/packing falling out and c/o feeling hot with nausea noted after seated in chair.  Will need continued skilled PT in the acute setting to allow d/c home with wife assist  Follow Up Recommendations  Home health PT;Supervision/Assistance - 24 hour     Does the patient have the potential to tolerate intense rehabilitation   N/A  Barriers to Discharge  None      Equipment Recommendations  3in1 (PT)    Recommendations for Other Services  OT  Frequency Min 3X/week   Progress towards PT Goals Progress towards PT goals: Progressing toward goals  Plan Current plan remains appropriate    Precautions / Restrictions Precautions Precautions: Fall   Pertinent Vitals/Pain Moderate pain in groin on right with transitions    Mobility  Bed Mobility Overal bed mobility: Needs Assistance Supine to sit: Mod assist General bed mobility comments: to guide legs off bed and lift trunk upright Transfers Overall transfer level: Needs assistance Equipment used: Rolling walker (2 wheeled) Transfers: Sit to/from Stand Sit to Stand: Min assist General transfer comment: from edge of bed able to stand pulls up on walker Ambulation/Gait Ambulation/Gait assistance: Min assist;Min guard Ambulation Distance (Feet): 80 Feet Assistive device: Rolling walker (2  wheeled) Gait Pattern/deviations: Step-through pattern;Decreased stride length;Trunk flexed;Shuffle Gait velocity interpretation: <1.8 ft/sec, indicative of risk for recurrent falls General Gait Details: chair following for safety      PT Goals (current goals can now be found in the care plan section)    Visit Information  Last PT Received On: 05/01/13 Assistance Needed: +1 History of Present Illness: Pt admitted for I&D of R groin abcess.  PMHx of CAD s/p CABG, CHF w/ AICD/PPM (04/06/12), A-fib (on Coumadin) PAD s/p R. Femoral endarterectomy and R/ Iliac stent placement, CKD, HTN, HLD, DM type II, transferred from [facility] ED w/ likely septic shock 2/2 bacteremia and acute respiratory failure requiring BiPAP->intubated    Subjective Data   Daughter kept reminding me over weekend to point my toes   Cognition  Cognition Arousal/Alertness: Awake/alert Behavior During Therapy: WFL for tasks assessed/performed Overall Cognitive Status: Within Functional Limits for tasks assessed    Balance  Balance Overall balance assessment: Needs assistance Standing balance support: Bilateral upper extremity supported Standing balance-Leahy Scale: Poor Standing balance comment: UE support needed for balance  End of Session PT - End of Session Equipment Utilized During Treatment: Gait belt Activity Tolerance: Patient limited by fatigue;Other (comment) (nausea) Patient left: in chair;with call bell/phone within reach   GP     [facility] 05/01/2013, 1:35 PM [place], [place] [phone] 05/01/2013

## 2013-05-01 NOTE — Progress Notes (Signed)
[facility]  Subjective  - Nausea after walking with PT.   Objective 119/78 83 98.8 F (37.1 C) (Oral) 19 98% No intake or output data in the 24 hours ending 05/01/13 1302  Dressing changed per nursing.   Assessment/Planning: POD #5 s/p: Debridement of right groin and replacement of Bovine patch with vein patch.  On antibiotics Moderate Staph Aureus  BID wet to dry dressing changes. Dr. Myra Gianotti and I will change the dressing tomorrow 05/02/2013   Clinton Gallant [facility] 05/01/2013 1:02 PM --  Laboratory Lab Results:  Recent Labs  04/30/13 0650  WBC 7.9  HGB 10.1*  HCT 31.2*  PLT 157   BMET  Recent Labs  04/30/13 0650 05/01/13 0615  NA 142 144  K 3.2* 3.3*  CL 103 104  CO2 24 27  GLUCOSE 124* 145*  BUN 18 13  CREATININE 0.71 0.67  CALCIUM 8.5 8.4    COAG Lab Results  Component Value Date   INR 1.62* 05/01/2013   INR 1.61* 04/30/2013   INR 1.53* 04/29/2013   No results found for this basename: PTT

## 2013-05-02 LAB — BASIC METABOLIC PANEL
BUN: 12 mg/dL (ref 6–23)
CHLORIDE: 104 meq/L (ref 96–112)
CO2: 26 mEq/L (ref 19–32)
Calcium: 8.2 mg/dL — ABNORMAL LOW (ref 8.4–10.5)
Creatinine, Ser: 0.69 mg/dL (ref 0.50–1.35)
Glucose, Bld: 150 mg/dL — ABNORMAL HIGH (ref 70–99)
Potassium: 3.9 mEq/L (ref 3.7–5.3)
SODIUM: 144 meq/L (ref 137–147)

## 2013-05-02 LAB — CBC
HCT: 33.4 % — ABNORMAL LOW (ref 39.0–52.0)
Hemoglobin: 10.7 g/dL — ABNORMAL LOW (ref 13.0–17.0)
MCH: 27.2 pg (ref 26.0–34.0)
MCHC: 32 g/dL (ref 30.0–36.0)
MCV: 84.8 fL (ref 78.0–100.0)
Platelets: 219 10*3/uL (ref 150–400)
RBC: 3.94 MIL/uL — ABNORMAL LOW (ref 4.22–5.81)
RDW: 20 % — ABNORMAL HIGH (ref 11.5–15.5)
WBC: 9.2 10*3/uL (ref 4.0–10.5)

## 2013-05-02 LAB — GLUCOSE, CAPILLARY
Glucose-Capillary: 138 mg/dL — ABNORMAL HIGH (ref 70–99)
Glucose-Capillary: 144 mg/dL — ABNORMAL HIGH (ref 70–99)
Glucose-Capillary: 157 mg/dL — ABNORMAL HIGH (ref 70–99)
Glucose-Capillary: 166 mg/dL — ABNORMAL HIGH (ref 70–99)
Glucose-Capillary: 209 mg/dL — ABNORMAL HIGH (ref 70–99)

## 2013-05-02 LAB — PROTIME-INR
INR: 2.09 — AB (ref 0.00–1.49)
PROTHROMBIN TIME: 22.8 s — AB (ref 11.6–15.2)

## 2013-05-02 MED ORDER — WARFARIN SODIUM 5 MG PO TABS
5.0000 mg | ORAL_TABLET | Freq: Once | ORAL | Status: AC
  Administered 2013-05-02: 5 mg via ORAL
  Filled 2013-05-02: qty 1

## 2013-05-02 NOTE — Care Management Note (Signed)
   CARE MANAGEMENT NOTE 05/02/2013  Patient:  [name] [name], [name] [name]   Account Number:  [id number]  Date Initiated:  04/26/2013  Documentation initiated by:  [facility]  Subjective/Objective Assessment:   Tx from outside hospital - septic.     Action/Plan:   05/01/13 Met with pt and wife and arranged [place] with [facility] for dressing changes to begin 05/03/13 pm.   Anticipated DC Date:  05/03/2013   Anticipated DC Plan:  [place]  CM consult      Choice offered to / List presented to:          [facility] arranged  HH-1 RN      Status of service:  In process, will continue to follow Medicare Important Message given?   (If response is "NO", the following Medicare IM given date fields will be blank) Date Medicare IM given:   Date Additional Medicare IM given:    Discharge Disposition:    Per UR Regulation:  Reviewed for med. necessity/level of care/duration of stay  If discussed at [place] of Stay Meetings, dates discussed:    Comments:  Contact:  Leopoldo, Mazzie [phone]                  [place], [place] Daughter     [phone]  05/02/2013 [facility] [place] [place] will provide [facility] services for this pt to teach the wife dressing changes and to reinforce any DM management needs. Pt reported that he had a INR meter in his home and was concerned that he may be charged for this as he has been in the hospital an unable to use the instrument. This CM was able to learn that the meter was supplied by [employer]. [employer] was notifed of pt hospitalization, and Maudie Mercury of [employer] called to state that the pt will be fine with the meter, he just needs to check his INR and call this in as soon as he gets home. Jasmine Pang RN MPH, care management, [phone]

## 2013-05-02 NOTE — Progress Notes (Signed)
ANTICOAGULATION CONSULT NOTE - Follow Up Consult  Pharmacy Consult for coumadin Indication: atrial fibrillation  Allergies  Allergen Reactions  . Lipitor [Atorvastatin] Other (See Comments)    Weak muscles  . Lodine [Etodolac] Nausea And Vomiting  . Methotrexate Derivatives Nausea And Vomiting  . Remicade [Infliximab] Other (See Comments)    Chills and shakes   . Ranitidine Hcl Nausea Only    Patient Measurements: Height: 5' 8.9" (175 cm) Weight: 205 lb 11 oz (93.3 kg) IBW/kg (Calculated) : 70.47  Vital Signs:    Labs:  Recent Labs  04/30/13 0650 05/01/13 0615 05/02/13 0602  HGB 10.1*  --  10.7*  HCT 31.2*  --  33.4*  PLT 157  --  219  LABPROT 18.7* 18.8* 22.8*  INR 1.61* 1.62* 2.09*  CREATININE 0.71 0.67 0.69    Estimated Creatinine Clearance: 110.6 ml/min (by C-G formula based on Cr of 0.69).   Assessment: 63 yo M admitted on 1/27 for a poorly healing right groin wound and sepsis. He is on chronic coumadin therapy for atrial fibrillation though it has been held during this admission. Patient is s/p I & D, and coumadin was restarted per pharmacy.   Patient's home dose is 5 mg daily. Patient states he has been stable on this dose for a "long time".   INR is therapeutic today at 2.09 - will return to patient's home coumadin dose. CBC improved: Hgb 10.7, platelet count 219. Pt says he experienced mild bleeding from his nose this morning and that "it only happens when his blood is too thin." Will decrease today's coumadin dose.   Mr. Tawil is also on Plavix and aspirin.  Goal of Therapy:  INR 2-3 Monitor platelets by anticoagulation protocol: Yes   Plan:  - Coumadin 5mg  PO x 1 dose tonight - If plan is for patient to be discharged, initiate 5 mg daily. Would recommend f/u INR check no later than Friday. - Daily INR - F/U bleeding complications  Wednesday, PharmD.  Clinical Pharmacist Pager [phone]

## 2013-05-02 NOTE — Progress Notes (Signed)
[facility] PROGRESS NOTE  [name] [name] VEL:[id number] DOB: [birth year] DOA: 04/25/2013 PCP: Gaspar Skeeters, MD   Assessment/Plan: 63 y.o. male w/ PMHx of CAD s/p CABG, CHF w/ AICD/PPM (04/06/12), A-fib (on Coumadin) PAD s/p R. Femoral endarterectomy and R/ Iliac stent placement, CKD, HTN, HLD, DM type II, transferred from [facility] ED w/ likely septic shock 2/2 bacteremia and acute respiratory failure requiring BiPAP->intubated.   Acute respiratory failure extubated (1/30) no re tube/DNR -resolved, cont prn bronchodilators, oxygen;   Septic shock septic shock, recent right iliac endarderectomy, s/p debridement, wound c/s: MSSA -per VSS: Groin exploration and debridement and removal of bovine pericardial patch and placement of vein patch, saphenous vein. Changed to po abx 2/1.    DM: resume glyburide, januvia. stable  CHF/AICD/PPM (04/06/12) echo (1/28): LVEF 20-25%, Diffuse hypokinesis, dilated cardiomyopathy  On coreg at lower dose ARB, lower dose. compensated   A fib on coumadin, HR uncontrolled with sepsis - resume dig, BB, coumadin;   CAD s/p CABG resume BB, ASA, Plavix; monitor   Hypokalemia: replete  GERD: change to po PPI  Nausea: PRN meds    Code Status: DNR Family Communication: multiple at bedside Disposition Plan: home   Consultants:  VSS  SIGNIFICANT EVENTS / STUDIES:  1/27 - Transferred from [place] ED w/ hypotension  1/27 - L. Femoral line placed, started on Levophed  1/28 OR R groin exploration and debridement w/ removal of bovine pericardial patch and replacement of vein patch, saphenous vein    LINES / TUBES:  L. Femoral CVL 1/27 >>>1/28  RIJ 1/28 >>>>  OETT 1/28 >>>   CULTURES:  Blood 1/27 >>>canceled  R. Femoral wound 1/27 >>> Moderate Staph Aureus  R femoral wound 1/28 >>> 1/2 R G+ cocci in pairs  R femoral wound drainage 1/28 >>> Moderate stap aureus   ANTIBIOTICS:  Vancomycin 1/27 >>> 2/1 Cefepime 1/27 >>>1/30   Flagyl 1/28 >>>1/30  unasyn 1/30>>>2/1 levaquin 2/1  HPI/Subjective: Still feeling flushed when up moving around  Objective: Filed Vitals:   05/01/13 2044  BP: 143/79  Pulse: 66  Temp: 97.7 F (36.5 C)  Resp: 20    Intake/Output Summary (Last 24 hours) at 05/02/13 1025 Last data filed at 05/02/13 0700  Gross per 24 hour  Intake    120 ml  Output   2376 ml  Net  -2256 ml   Filed Weights   04/29/13 2027 04/30/13 2045 05/01/13 2044  Weight: 97 kg (213 lb 13.5 oz) 97.5 kg (214 lb 15.2 oz) 93.3 kg (205 lb 11 oz)    Exam:   General:  Up on toilet, needing assistance getting up  Cardiovascular: s1,s2 rrr  Respiratory: CTA without WRR  Abdomen: soft, nt, nd   Musculoskeletal: 1+ edema. Right groin wound with ABD. No drainage noted.  Data Reviewed: Basic Metabolic Panel:  Recent Labs Lab 04/26/13 0345  04/27/13 0440 04/27/13 1800 04/28/13 0425 04/30/13 0650 05/01/13 0615 05/02/13 0602  NA 134*  < > 139 137 139 142 144 144  K 5.0  < > 3.6* 4.3 4.9 3.2* 3.3* 3.9  CL 98  < > 106 105 105 103 104 104  CO2 17*  < > 19 19 19 24 27 26   GLUCOSE 388*  < > 139* 99 198* 124* 145* 150*  BUN 22  < > 25* 18 23 18 13 12   CREATININE 1.74*  < > 1.02 0.82 0.88 0.71 0.67 0.69  CALCIUM 7.5*  < > 7.5* 7.8* 8.0* 8.5 8.4 8.2*  MG 1.3*  --  2.6*  --   --   --  1.6  --   < > = values in this interval not displayed. Liver Function Tests:  Recent Labs Lab 04/26/13 0345 04/26/13 0930  AST 59* 80*  ALT 28 38  ALKPHOS 72 86  BILITOT 1.1 1.7*  PROT 5.5* 6.1  ALBUMIN 2.4* 2.6*   No results found for this basename: LIPASE, AMYLASE,  in the last 168 hours No results found for this basename: AMMONIA,  in the last 168 hours CBC:  Recent Labs Lab 04/26/13 0345  04/26/13 1451 04/27/13 0440 04/28/13 0425 04/30/13 0650 05/02/13 0602  WBC 15.7*  --   --  8.1 8.5 7.9 9.2  HGB 9.7*  < > 9.2* 8.7* 8.9* 10.1* 10.7*  HCT 30.2*  < > 27.0* 26.6* 27.1* 31.2* 33.4*  MCV 83.0  --    --  83.1 83.4 84.1 84.8  PLT 173  --   --  100* 93* 157 219  < > = values in this interval not displayed. Cardiac Enzymes:  Recent Labs Lab 04/26/13 0645 04/26/13 0943 04/26/13 1730 04/27/13 0755  TROPONINI 0.41* 0.45* 0.53* 0.34*   BNP (last 3 results)  Recent Labs  03/16/13 0405 03/19/13 0430 04/25/13 2205  PROBNP 1650.0* 2143.0* 8808.0*   CBG:  Recent Labs Lab 05/01/13 1426 05/01/13 1751 05/01/13 2042 05/02/13 0031 05/02/13 0757  GLUCAP 178* 122* 159* 157* 144*    Recent Results (from the past 240 hour(s))  WOUND CULTURE     Status: None   Collection Time    04/25/13 10:10 PM      Result Value Range Status   Specimen Description WOUND GROIN RIGHT   Final   Special Requests Normal   Final   Gram Stain     Final   Value: NO WBC SEEN     NO SQUAMOUS EPITHELIAL CELLS SEEN     NO ORGANISMS SEEN     Performed at [facility]   Culture     Final   Value: MODERATE STAPHYLOCOCCUS AUREUS     Note: RIFAMPIN AND GENTAMICIN SHOULD NOT BE USED AS SINGLE DRUGS FOR TREATMENT OF STAPH INFECTIONS.     Performed at [facility]   Report Status 04/28/2013 FINAL   Final   Organism ID, Bacteria STAPHYLOCOCCUS AUREUS   Final  WOUND CULTURE     Status: None   Collection Time    04/26/13 12:05 AM      Result Value Range Status   Specimen Description WOUND RIGHT GROIN DRAINAGE   Final   Special Requests Normal   Final   Gram Stain     Final   Value: NO WBC SEEN     NO SQUAMOUS EPITHELIAL CELLS SEEN     NO ORGANISMS SEEN     Performed at [facility]   Culture     Final   Value: MODERATE STAPHYLOCOCCUS AUREUS     Note: RIFAMPIN AND GENTAMICIN SHOULD NOT BE USED AS SINGLE DRUGS FOR TREATMENT OF STAPH INFECTIONS.     Performed at [facility]   Report Status 04/28/2013 FINAL   Final   Organism ID, Bacteria STAPHYLOCOCCUS AUREUS   Final  MRSA PCR SCREENING     Status: None   Collection Time    04/26/13  1:48 AM      Result Value Range  Status   MRSA by PCR NEGATIVE  NEGATIVE Final   Comment:  The GeneXpert MRSA Assay (FDA     approved for NASAL specimens     only), is one component of a     comprehensive MRSA colonization     surveillance program. It is not     intended to diagnose MRSA     infection nor to guide or     monitor treatment for     MRSA infections.  WOUND CULTURE     Status: None   Collection Time    04/26/13  1:43 PM      Result Value Range Status   Specimen Description WOUND RIGHT GROIN   Final   Special Requests SPECIMEN NO.1 PT ON FLAGYL,VANCOMYCIN,MAXIPINE   Final   Gram Stain     Final   Value: NO WBC SEEN     NO SQUAMOUS EPITHELIAL CELLS SEEN     NO ORGANISMS SEEN     Performed at [facility]   Culture     Final   Value: FEW STAPHYLOCOCCUS AUREUS     Note: RIFAMPIN AND GENTAMICIN SHOULD NOT BE USED AS SINGLE DRUGS FOR TREATMENT OF STAPH INFECTIONS.     Performed at [facility]   Report Status 04/29/2013 FINAL   Final   Organism ID, Bacteria STAPHYLOCOCCUS AUREUS   Final  WOUND CULTURE     Status: None   Collection Time    04/26/13  1:46 PM      Result Value Range Status   Specimen Description WOUND RIGHT GROIN   Final   Special Requests SPECIMEN NO. 2 PT ON FLAGYL,VANCOMYCIN,MAXIPINE   Final   Gram Stain     Final   Value: RARE WBC PRESENT,BOTH PMN AND MONONUCLEAR     NO SQUAMOUS EPITHELIAL CELLS SEEN     RARE GRAM POSITIVE COCCI IN PAIRS     Performed at [facility]   Culture     Final   Value: MODERATE STAPHYLOCOCCUS AUREUS     Note: RIFAMPIN AND GENTAMICIN SHOULD NOT BE USED AS SINGLE DRUGS FOR TREATMENT OF STAPH INFECTIONS.     Performed at [facility]   Report Status 04/29/2013 FINAL   Final   Organism ID, Bacteria STAPHYLOCOCCUS AUREUS   Final  CLOSTRIDIUM DIFFICILE BY PCR     Status: None   Collection Time    04/28/13  2:00 PM      Result Value Range Status   C difficile by pcr NEGATIVE  NEGATIVE Final     Studies: No  results found.  Scheduled Meds: . antiseptic oral rinse  15 mL Mouth Rinse QID  . aspirin EC  81 mg Oral Daily  . carvedilol  6.25 mg Oral BID WC  . chlorhexidine  15 mL Mouth Rinse BID  . clopidogrel  75 mg Oral Q breakfast  . digoxin  0.125 mg Oral Daily  . ezetimibe  10 mg Oral QHS  . feeding supplement (ENSURE COMPLETE)  237 mL Oral TID BM  . furosemide  40 mg Oral Daily  . gabapentin  300 mg Oral BID  . glyBURIDE  5 mg Oral BID WC  . insulin aspart  0-20 Units Subcutaneous TID WC  . isosorbide mononitrate  30 mg Oral Daily  . levofloxacin  500 mg Oral Daily  . linagliptin  5 mg Oral Daily  . losartan  25 mg Oral Daily  . pantoprazole  40 mg Oral BID  . potassium chloride SA  40 mEq Oral TID  . predniSONE  5  mg Oral BID  . Warfarin - Pharmacist Dosing Inpatient   Does not apply q1800   Continuous Infusions:   Time spent: 25 minutes   Marlin Canary  [facility] Pager [phone]. If 7PM-7AM, please contact night-coverage at [email or web address], password [facility] 05/02/2013, 10:25 AM  LOS: 7 days

## 2013-05-02 NOTE — Progress Notes (Signed)
Subjective  - POD #6  Feels weak   Physical Exam:  Right groin dressing changed.  Still some necrotic tissue present.  Vein patch was not exposed.  There was a lot of drainage       Assessment/Plan:  POD #6  I would recommend keeping the patient in the hospital for several more days so that I can continue to evaluate his wound.  He may require another operative debridement.  [name] [name], [name] 05/02/2013 2:49 PM --  Filed Vitals:   05/02/13 1302  BP: 139/98  Pulse: 81  Temp: 98.1 F (36.7 C)  Resp: 16    Intake/Output Summary (Last 24 hours) at 05/02/13 1449 Last data filed at 05/02/13 1300  Gross per 24 hour  Intake    480 ml  Output   2876 ml  Net  -2396 ml     Laboratory CBC    Component Value Date/Time   WBC 9.2 05/02/2013 0602   HGB 10.7* 05/02/2013 0602   HCT 33.4* 05/02/2013 0602   PLT 219 05/02/2013 0602    BMET    Component Value Date/Time   NA 144 05/02/2013 0602   K 3.9 05/02/2013 0602   CL 104 05/02/2013 0602   CO2 26 05/02/2013 0602   GLUCOSE 150* 05/02/2013 0602   BUN 12 05/02/2013 0602   CREATININE 0.69 05/02/2013 0602   CALCIUM 8.2* 05/02/2013 0602   GFRNONAA >90 05/02/2013 0602   GFRAA >90 05/02/2013 0602    COAG Lab Results  Component Value Date   INR 2.09* 05/02/2013   INR 1.62* 05/01/2013   INR 1.61* 04/30/2013   No results found for this basename: PTT    Antibiotics Anti-infectives   Start     Dose/Rate Route Frequency Ordered Stop   04/30/13 1500  levofloxacin (LEVAQUIN) tablet 500 mg     500 mg Oral Daily 04/30/13 1404     04/28/13 1400  vancomycin (VANCOCIN) IVPB 1000 mg/200 mL premix  Status:  Discontinued     1,000 mg 200 mL/hr over 60 Minutes Intravenous Every 8 hours 04/28/13 1017 04/30/13 1338   04/28/13 1100  ampicillin-sulbactam (UNASYN) 1.5 g in sodium chloride 0.9 % 50 mL IVPB  Status:  Discontinued     1.5 g 100 mL/hr over 30 Minutes Intravenous Every 6 hours 04/28/13 1016 04/30/13 1346   04/27/13 0600  vancomycin (VANCOCIN)  IVPB 1000 mg/200 mL premix  Status:  Discontinued     1,000 mg 200 mL/hr over 60 Minutes Intravenous Every 24 hours 04/26/13 0432 04/28/13 1017   04/26/13 1000  ceFEPIme (MAXIPIME) 1 g in dextrose 5 % 50 mL IVPB  Status:  Discontinued     1 g 100 mL/hr over 30 Minutes Intravenous Every 12 hours 04/26/13 0432 04/28/13 1005   04/26/13 1000  metroNIDAZOLE (FLAGYL) IVPB 500 mg  Status:  Discontinued     500 mg 100 mL/hr over 60 Minutes Intravenous Every 6 hours 04/26/13 0858 04/28/13 1005   04/26/13 0030  vancomycin (VANCOCIN) IVPB 1000 mg/200 mL premix     1,000 mg 200 mL/hr over 60 Minutes Intravenous  Once 04/26/13 0019 04/26/13 0338   04/26/13 0030  ceFEPIme (MAXIPIME) 2 g in dextrose 5 % 50 mL IVPB     2 g 100 mL/hr over 30 Minutes Intravenous  Once 04/26/13 0019 04/28/13 1627   04/25/13 2215  piperacillin-tazobactam (ZOSYN) IVPB 3.375 g  Status:  Discontinued     3.375 g 100 mL/hr over 30 Minutes  Intravenous  Once 04/25/13 2203 04/25/13 2204       V. Charlena Cross, M.D. [facility] [place] Office: [phone] Pager:  [phone]

## 2013-05-03 LAB — GLUCOSE, CAPILLARY
GLUCOSE-CAPILLARY: 143 mg/dL — AB (ref 70–99)
Glucose-Capillary: 211 mg/dL — ABNORMAL HIGH (ref 70–99)
Glucose-Capillary: 131 mg/dL — ABNORMAL HIGH (ref 70–99)
Glucose-Capillary: 181 mg/dL — ABNORMAL HIGH (ref 70–99)

## 2013-05-03 LAB — PROTIME-INR
INR: 2.76 — ABNORMAL HIGH (ref 0.00–1.49)
PROTHROMBIN TIME: 28.2 s — AB (ref 11.6–15.2)

## 2013-05-03 MED ORDER — WARFARIN SODIUM 2 MG PO TABS
2.0000 mg | ORAL_TABLET | Freq: Once | ORAL | Status: AC
  Administered 2013-05-03: 2 mg via ORAL
  Filled 2013-05-03: qty 1

## 2013-05-03 MED ORDER — GLUCERNA SHAKE PO LIQD
237.0000 mL | ORAL | Status: DC
  Administered 2013-05-03 – 2013-05-05 (×3): 237 mL via ORAL

## 2013-05-03 NOTE — Progress Notes (Addendum)
NUTRITION FOLLOW UP  Intervention:   Discontinue Ensure Complete PO TID - pt refusing. Add Glucerna Shake po daily, each supplement provides 220 kcal and 10 grams of protein, per pt preferences. RD to continue to follow nutrition care plan.  Nutrition Dx:   Inadequate oral intake now related to limited appetite AEB variable oral intake.  Goal:   Intake to meet >90% of estimated nutrition needs. improving  Monitor:   Supplement tolerance, PO intake, labs, weight trend  Assessment:   Patient is a 63 y.o. male w/ PMHx of CAD s/p CABG, CHF w/ AICD/PPM (04/06/12), A-fib (on Coumadin) PAD s/p R. Femoral endarterectomy and R/ Iliac stent placement, CKD, HTN, HLD, DM type II, transferred from [facility] ED on 1/27 with likely septic shock 2/2 bacteremia and acute respiratory failure requiring BiPAP. Required intubation on 1/28.  Patient was extubated 1/30. Code status was changed to DNR after physicians meeting with family. Plans are not to re-intubate, but to make comfortable if he develops severe respiratory distress.  Advanced to Carbohydrate Modified Medium diet on 1/31. Meal intake appears to be 50-60%. Ordered for Ensure Complete PO TID. Per RN, pt is not taking Ensure. Will drink Glucerna daily. RD to change accordingly.  Plan to return to OR in near future for debridement of R groin wound, pending changes over the next few days.  Diabetes coordinator provided information on CHO counting on 2/2, per note.  Height: Ht Readings from Last 1 Encounters:  05/02/13 5\' 9"  (1.753 m)    Weight Status:   Wt Readings from Last 1 Encounters:  05/02/13 201 lb 6.4 oz (91.354 kg)  04/26/13  206 lb 12.7 oz (93.8 kg)  Re-estimated needs:  Kcal: 2000-2200 Protein: 110-125 gm Fluid: 2-2.2 L  Skin: right groin abscess, right ankle wound  Diet Order: Carb Control   Intake/Output Summary (Last 24 hours) at 05/03/13 1023 Last data filed at 05/03/13 0644  Gross per 24 hour  Intake    600 ml   Output    850 ml  Net   -250 ml    Last BM: 2/3   Labs:   Recent Labs Lab 04/27/13 0440  04/30/13 0650 05/01/13 0615 05/02/13 0602  NA 139  < > 142 144 144  K 3.6*  < > 3.2* 3.3* 3.9  CL 106  < > 103 104 104  CO2 19  < > 24 27 26   BUN 25*  < > 18 13 12   CREATININE 1.02  < > 0.71 0.67 0.69  CALCIUM 7.5*  < > 8.5 8.4 8.2*  MG 2.6*  --   --  1.6  --   GLUCOSE 139*  < > 124* 145* 150*  < > = values in this interval not displayed.  CBG (last 3)   Recent Labs  05/02/13 1623 05/02/13 2106 05/03/13 0756  GLUCAP 166* 209* 211*    Scheduled Meds: . antiseptic oral rinse  15 mL Mouth Rinse QID  . aspirin EC  81 mg Oral Daily  . carvedilol  6.25 mg Oral BID WC  . chlorhexidine  15 mL Mouth Rinse BID  . clopidogrel  75 mg Oral Q breakfast  . digoxin  0.125 mg Oral Daily  . ezetimibe  10 mg Oral QHS  . feeding supplement (ENSURE COMPLETE)  237 mL Oral TID BM  . furosemide  40 mg Oral Daily  . gabapentin  300 mg Oral BID  . glyBURIDE  5 mg Oral BID WC  . insulin  aspart  0-20 Units Subcutaneous TID WC  . isosorbide mononitrate  30 mg Oral Daily  . levofloxacin  500 mg Oral Daily  . linagliptin  5 mg Oral Daily  . losartan  25 mg Oral Daily  . pantoprazole  40 mg Oral BID  . potassium chloride SA  40 mEq Oral TID  . predniSONE  5 mg Oral BID  . Warfarin - Pharmacist Dosing Inpatient   Does not apply q1800    Continuous Infusions:    [name] Motto MS, RD, LDN Pager: [phone] After-hours pager: [phone]

## 2013-05-03 NOTE — Progress Notes (Signed)
Physical Therapy Treatment Patient Details Name: [name] [name] MRN: [id number] DOB: 1951/01/31 Today's Date: 05/03/2013 Time: 1033-1100 PT Time Calculation (min): 27 min  PT Assessment / Plan / Recommendation  History of Present Illness Pt admitted for I&D of R groin abcess.  PMHx of CAD s/p CABG, CHF w/ AICD/PPM (04/06/12), A-fib (on Coumadin) PAD s/p R. Femoral endarterectomy and R/ Iliac stent placement, CKD, HTN, HLD, DM type II, transferred from [facility] ED w/ likely septic shock 2/2 bacteremia and acute respiratory failure requiring BiPAP->intubated   PT Comments   Patient progressing nicely with ambulation tolerance and mobility in the room.  Reports walking with assist to/from bathroom.  Will continue to benefit from skilled PT in the acute setting.  Follow Up Recommendations  Home health PT;Supervision/Assistance - 24 hour     Does the patient have the potential to tolerate intense rehabilitation   N/A  Barriers to Discharge  None      Equipment Recommendations  3in1 (PT)    Recommendations for Other Services  None  Frequency Min 3X/week   Progress towards PT Goals Progress towards PT goals: Progressing toward goals  Plan Current plan remains appropriate    Precautions / Restrictions Precautions Precautions: Fall   Pertinent Vitals/Pain 6-7/10 right groin with transfers    Mobility  Bed Mobility Bed Mobility: Supine to Sit Supine to sit: Min assist General bed mobility comments: for right LE Transfers Equipment used: Rolling walker (2 wheeled) Transfers: Sit to/from Stand Stand pivot transfers: Min guard Ambulation/Gait Ambulation/Gait assistance: Min guard;Supervision Ambulation Distance (Feet): 170 Feet (x 2) Assistive device: Rolling walker (2 wheeled) Gait Pattern/deviations: Step-through pattern;Wide base of support General Gait Details: chair following for safety      PT Goals (current goals can now be found in the care plan section)     Visit Information  Last PT Received On: 05/03/13 Assistance Needed: +1 History of Present Illness: Pt admitted for I&D of R groin abcess.  PMHx of CAD s/p CABG, CHF w/ AICD/PPM (04/06/12), A-fib (on Coumadin) PAD s/p R. Femoral endarterectomy and R/ Iliac stent placement, CKD, HTN, HLD, DM type II, transferred from [facility] ED w/ likely septic shock 2/2 bacteremia and acute respiratory failure requiring BiPAP->intubated    Subjective Data      Cognition  Cognition Arousal/Alertness: Awake/alert Behavior During Therapy: WFL for tasks assessed/performed Overall Cognitive Status: Within Functional Limits for tasks assessed    Balance  Balance Overall balance assessment: Needs assistance Standing balance support: Bilateral upper extremity supported Standing balance-Leahy Scale: Poor Standing balance comment: improving but still needs UE support for balance  End of Session PT - End of Session Equipment Utilized During Treatment: Gait belt Activity Tolerance: Patient tolerated treatment well Patient left: in chair;with family/visitor present;with call bell/phone within reach   GP     [facility] 05/03/2013, 11:24 AM Sheran Lawless, PT [phone] 05/03/2013

## 2013-05-03 NOTE — Progress Notes (Addendum)
[facility] [place]  Subjective  - He feels weak when he gets up and still has a little dizziness.   Objective 150/99 117 97.7 F (36.5 C) (Oral) 20 98%  Intake/Output Summary (Last 24 hours) at 05/03/13 0745 Last data filed at 05/03/13 1610  Gross per 24 hour  Intake    720 ml  Output   1050 ml  Net   -330 ml    Feet warm to touch well perfused Right groin dressing clean and dry, just changed by nursing staff this am.  Assessment/Planning: POD #7 s/p: Debridement of right groin and replacement of Bovine patch with vein patch.  On antibiotics  Moderate Staph Aureus  BID wet to dry dressing changes. Plan to return to OR is possible in the near future for debridement pending wound changes over the next few days.   Clinton Gallant [facility] 05/03/2013 7:45 AM --  Laboratory Lab Results:  Recent Labs  05/02/13 0602  WBC 9.2  HGB 10.7*  HCT 33.4*  PLT 219   BMET  Recent Labs  05/01/13 0615 05/02/13 0602  NA 144 144  K 3.3* 3.9  CL 104 104  CO2 27 26  GLUCOSE 145* 150*  BUN 13 12  CREATININE 0.67 0.69  CALCIUM 8.4 8.2*    COAG Lab Results  Component Value Date   INR 2.76* 05/03/2013   INR 2.09* 05/02/2013   INR 1.62* 05/01/2013   No results found for this basename: PTT      I agree with the above.  I changed the wound today.  There was a large amount of fluid.  I think the dressing changes need to be increased to 3 times daily.  Durene Cal

## 2013-05-03 NOTE — Progress Notes (Signed)
[facility] PROGRESS NOTE  KATHRYN LINAREZ EGB:[id number] DOB: Jun 27, 1950 DOA: 04/25/2013 PCP: Gaspar Skeeters, MD   Assessment/Plan: 63 y.o. male w/ PMHx of CAD s/p CABG, CHF w/ AICD/PPM (04/06/12), A-fib (on Coumadin) PAD s/p R. Femoral endarterectomy and R/ Iliac stent placement, CKD, HTN, HLD, DM type II, transferred from [facility] ED w/ likely septic shock 2/2 bacteremia and acute respiratory failure requiring BiPAP->intubated.   Acute respiratory failure extubated (1/30) no re tube/DNR -resolved, cont prn bronchodilators, oxygen;   Septic shock septic shock, recent right iliac endarderectomy, s/p debridement, wound c/s: MSSA -per VSS: Groin exploration and debridement and removal of bovine pericardial patch and placement of vein patch, saphenous vein. Changed to po abx 2/1.    DM: resume glyburide, januvia. stable  CHF/AICD/PPM (04/06/12) echo (1/28): LVEF 20-25%, Diffuse hypokinesis, dilated cardiomyopathy  On coreg at lower dose ARB, lower dose. compensated   A fib on coumadin, HR uncontrolled with sepsis - resume dig, BB, coumadin;   CAD s/p CABG resume BB, ASA, Plavix; monitor   Hypokalemia: replete  GERD: change to po PPI  Nausea: PRN meds    Code Status: DNR Family Communication: at bedside Disposition Plan: home when ok with vascular   Consultants:  VSS  SIGNIFICANT EVENTS / STUDIES:  1/27 - Transferred from [place] ED w/ hypotension  1/27 - L. Femoral line placed, started on Levophed  1/28 OR R groin exploration and debridement w/ removal of bovine pericardial patch and replacement of vein patch, saphenous vein    LINES / TUBES:  L. Femoral CVL 1/27 >>>1/28  RIJ 1/28 >>>>  OETT 1/28 >>>   CULTURES:  Blood 1/27 >>>canceled  R. Femoral wound 1/27 >>> Moderate Staph Aureus  R femoral wound 1/28 >>> 1/2 R G+ cocci in pairs  R femoral wound drainage 1/28 >>> Moderate stap aureus   ANTIBIOTICS:  Vancomycin 1/27 >>> 2/1 Cefepime 1/27  >>>1/30  Flagyl 1/28 >>>1/30  unasyn 1/30>>>2/1 levaquin 2/1  HPI/Subjective: Less flushing today Went for a long walk  Objective: Filed Vitals:   05/03/13 1100  BP: 150/79  Pulse: 87  Temp: 98.4 F (36.9 C)  Resp: 18    Intake/Output Summary (Last 24 hours) at 05/03/13 1247 Last data filed at 05/03/13 1215  Gross per 24 hour  Intake    600 ml  Output   1325 ml  Net   -725 ml   Filed Weights   04/30/13 2045 05/01/13 2044 05/02/13 2000  Weight: 97.5 kg (214 lb 15.2 oz) 93.3 kg (205 lb 11 oz) 91.354 kg (201 lb 6.4 oz)    Exam:   General:  NAD, eating lunch  Cardiovascular: s1,s2 rrr  Respiratory: CTA without WRR  Abdomen: soft, nt, nd   Musculoskeletal: 1+ edema. Right groin wound with dressing  Data Reviewed: Basic Metabolic Panel:  Recent Labs Lab 04/27/13 0440 04/27/13 1800 04/28/13 0425 04/30/13 0650 05/01/13 0615 05/02/13 0602  NA 139 137 139 142 144 144  K 3.6* 4.3 4.9 3.2* 3.3* 3.9  CL 106 105 105 103 104 104  CO2 19 19 19 24 27 26   GLUCOSE 139* 99 198* 124* 145* 150*  BUN 25* 18 23 18 13 12   CREATININE 1.02 0.82 0.88 0.71 0.67 0.69  CALCIUM 7.5* 7.8* 8.0* 8.5 8.4 8.2*  MG 2.6*  --   --   --  1.6  --    Liver Function Tests: No results found for this basename: AST, ALT, ALKPHOS, BILITOT, PROT, ALBUMIN,  in the last 168  hours No results found for this basename: LIPASE, AMYLASE,  in the last 168 hours No results found for this basename: AMMONIA,  in the last 168 hours CBC:  Recent Labs Lab 04/26/13 1451 04/27/13 0440 04/28/13 0425 04/30/13 0650 05/02/13 0602  WBC  --  8.1 8.5 7.9 9.2  HGB 9.2* 8.7* 8.9* 10.1* 10.7*  HCT 27.0* 26.6* 27.1* 31.2* 33.4*  MCV  --  83.1 83.4 84.1 84.8  PLT  --  100* 93* 157 219   Cardiac Enzymes:  Recent Labs Lab 04/26/13 1730 04/27/13 0755  TROPONINI 0.53* 0.34*   BNP (last 3 results)  Recent Labs  03/16/13 0405 03/19/13 0430 04/25/13 2205  PROBNP 1650.0* 2143.0* 8808.0*    CBG:  Recent Labs Lab 05/02/13 1142 05/02/13 1623 05/02/13 2106 05/03/13 0756 05/03/13 1215  GLUCAP 138* 166* 209* 211* 131*    Recent Results (from the past 240 hour(s))  WOUND CULTURE     Status: None   Collection Time    04/25/13 10:10 PM      Result Value Range Status   Specimen Description WOUND GROIN RIGHT   Final   Special Requests Normal   Final   Gram Stain     Final   Value: NO WBC SEEN     NO SQUAMOUS EPITHELIAL CELLS SEEN     NO ORGANISMS SEEN     Performed at [facility]   Culture     Final   Value: MODERATE STAPHYLOCOCCUS AUREUS     Note: RIFAMPIN AND GENTAMICIN SHOULD NOT BE USED AS SINGLE DRUGS FOR TREATMENT OF STAPH INFECTIONS.     Performed at [facility]   Report Status 04/28/2013 FINAL   Final   Organism ID, Bacteria STAPHYLOCOCCUS AUREUS   Final  WOUND CULTURE     Status: None   Collection Time    04/26/13 12:05 AM      Result Value Range Status   Specimen Description WOUND RIGHT GROIN DRAINAGE   Final   Special Requests Normal   Final   Gram Stain     Final   Value: NO WBC SEEN     NO SQUAMOUS EPITHELIAL CELLS SEEN     NO ORGANISMS SEEN     Performed at [facility]   Culture     Final   Value: MODERATE STAPHYLOCOCCUS AUREUS     Note: RIFAMPIN AND GENTAMICIN SHOULD NOT BE USED AS SINGLE DRUGS FOR TREATMENT OF STAPH INFECTIONS.     Performed at [facility]   Report Status 04/28/2013 FINAL   Final   Organism ID, Bacteria STAPHYLOCOCCUS AUREUS   Final  MRSA PCR SCREENING     Status: None   Collection Time    04/26/13  1:48 AM      Result Value Range Status   MRSA by PCR NEGATIVE  NEGATIVE Final   Comment:            The GeneXpert MRSA Assay (FDA     approved for NASAL specimens     only), is one component of a     comprehensive MRSA colonization     surveillance program. It is not     intended to diagnose MRSA     infection nor to guide or     monitor treatment for     MRSA infections.  WOUND  CULTURE     Status: None   Collection Time    04/26/13  1:43 PM  Result Value Range Status   Specimen Description WOUND RIGHT GROIN   Final   Special Requests SPECIMEN NO.1 PT ON FLAGYL,VANCOMYCIN,MAXIPINE   Final   Gram Stain     Final   Value: NO WBC SEEN     NO SQUAMOUS EPITHELIAL CELLS SEEN     NO ORGANISMS SEEN     Performed at [facility]   Culture     Final   Value: FEW STAPHYLOCOCCUS AUREUS     Note: RIFAMPIN AND GENTAMICIN SHOULD NOT BE USED AS SINGLE DRUGS FOR TREATMENT OF STAPH INFECTIONS.     Performed at [facility]   Report Status 04/29/2013 FINAL   Final   Organism ID, Bacteria STAPHYLOCOCCUS AUREUS   Final  WOUND CULTURE     Status: None   Collection Time    04/26/13  1:46 PM      Result Value Range Status   Specimen Description WOUND RIGHT GROIN   Final   Special Requests SPECIMEN NO. 2 PT ON FLAGYL,VANCOMYCIN,MAXIPINE   Final   Gram Stain     Final   Value: RARE WBC PRESENT,BOTH PMN AND MONONUCLEAR     NO SQUAMOUS EPITHELIAL CELLS SEEN     RARE GRAM POSITIVE COCCI IN PAIRS     Performed at [facility]   Culture     Final   Value: MODERATE STAPHYLOCOCCUS AUREUS     Note: RIFAMPIN AND GENTAMICIN SHOULD NOT BE USED AS SINGLE DRUGS FOR TREATMENT OF STAPH INFECTIONS.     Performed at [facility]   Report Status 04/29/2013 FINAL   Final   Organism ID, Bacteria STAPHYLOCOCCUS AUREUS   Final  CLOSTRIDIUM DIFFICILE BY PCR     Status: None   Collection Time    04/28/13  2:00 PM      Result Value Range Status   C difficile by pcr NEGATIVE  NEGATIVE Final     Studies: No results found.  Scheduled Meds: . antiseptic oral rinse  15 mL Mouth Rinse QID  . aspirin EC  81 mg Oral Daily  . carvedilol  6.25 mg Oral BID WC  . chlorhexidine  15 mL Mouth Rinse BID  . clopidogrel  75 mg Oral Q breakfast  . digoxin  0.125 mg Oral Daily  . ezetimibe  10 mg Oral QHS  . feeding supplement (GLUCERNA SHAKE)  237 mL Oral Q24H  .  furosemide  40 mg Oral Daily  . gabapentin  300 mg Oral BID  . glyBURIDE  5 mg Oral BID WC  . insulin aspart  0-20 Units Subcutaneous TID WC  . isosorbide mononitrate  30 mg Oral Daily  . levofloxacin  500 mg Oral Daily  . linagliptin  5 mg Oral Daily  . losartan  25 mg Oral Daily  . pantoprazole  40 mg Oral BID  . predniSONE  5 mg Oral BID  . warfarin  2 mg Oral ONCE-1800  . Warfarin - Pharmacist Dosing Inpatient   Does not apply q1800   Continuous Infusions:   Time spent: 25 minutes   Marlin Canary  [facility] Pager [phone]. If 7PM-7AM, please contact night-coverage at [email or web address], password [facility] 05/03/2013, 12:47 PM  LOS: 8 days

## 2013-05-03 NOTE — Progress Notes (Signed)
ANTICOAGULATION CONSULT NOTE - Follow Up Consult  Pharmacy Consult for coumadin Indication: atrial fibrillation  Allergies  Allergen Reactions  . Lipitor [Atorvastatin] Other (See Comments)    Weak muscles  . Lodine [Etodolac] Nausea And Vomiting  . Methotrexate Derivatives Nausea And Vomiting  . Remicade [Infliximab] Other (See Comments)    Chills and shakes   . Ranitidine Hcl Nausea Only    Patient Measurements: Height: 5\' 9"  (175.3 cm) Weight: 201 lb 6.4 oz (91.354 kg) IBW/kg (Calculated) : 70.7  Vital Signs: Temp: 97.7 F (36.5 C) (02/04 0507) Temp src: Oral (02/04 0507) BP: 150/99 mmHg (02/04 0507) Pulse Rate: 117 (02/04 0554)  Labs:  Recent Labs  05/01/13 0615 05/02/13 0602 05/03/13 0550  HGB  --  10.7*  --   HCT  --  33.4*  --   PLT  --  219  --   LABPROT 18.8* 22.8* 28.2*  INR 1.62* 2.09* 2.76*  CREATININE 0.67 0.69  --     Estimated Creatinine Clearance: 109.7 ml/min (by C-G formula based on Cr of 0.69).   Assessment: 63 yo M admitted on 1/27 for a poorly healing right groin wound and sepsis. He is on chronic coumadin therapy for atrial fibrillation though it has been held during this admission. Patient is s/p I & D, and coumadin was restarted per pharmacy.   Patient's home dose is 5 mg daily. Patient states he has been stable on this dose for a "long time".   INR is therapeutic today but has trended up significantly to 2.76 - will decrease dose today. CBC improved: Hgb 10.7, platelet count 219. Pt states his mild nose bleed has resolved.   Mr. Kobus is also on Plavix and aspirin.  Goal of Therapy:  INR 2-3 Monitor platelets by anticoagulation protocol: Yes   Plan:  - Coumadin 2 mg PO x 1 dose tonight - Daily INR - F/U bleeding complications  Roma Kayser, PharmD.  Clinical Pharmacist Pager [phone]

## 2013-05-04 LAB — CBC
HCT: 37.2 % — ABNORMAL LOW (ref 39.0–52.0)
HEMOGLOBIN: 11.5 g/dL — AB (ref 13.0–17.0)
MCH: 26.9 pg (ref 26.0–34.0)
MCHC: 30.9 g/dL (ref 30.0–36.0)
MCV: 87.1 fL (ref 78.0–100.0)
Platelets: 283 10*3/uL (ref 150–400)
RBC: 4.27 MIL/uL (ref 4.22–5.81)
RDW: 20.4 % — ABNORMAL HIGH (ref 11.5–15.5)
WBC: 11.5 10*3/uL — ABNORMAL HIGH (ref 4.0–10.5)

## 2013-05-04 LAB — BASIC METABOLIC PANEL
BUN: 11 mg/dL (ref 6–23)
CO2: 24 meq/L (ref 19–32)
Calcium: 9 mg/dL (ref 8.4–10.5)
Chloride: 102 mEq/L (ref 96–112)
Creatinine, Ser: 0.74 mg/dL (ref 0.50–1.35)
GFR calc Af Amer: >90 mL/min (ref >90)
GLUCOSE: 151 mg/dL — AB (ref 70–99)
Potassium: 4.5 mEq/L (ref 3.7–5.3)
SODIUM: 143 meq/L (ref 137–147)

## 2013-05-04 LAB — PROTIME-INR
INR: 2.15 — AB (ref 0.00–1.49)
PROTHROMBIN TIME: 23.3 s — AB (ref 11.6–15.2)

## 2013-05-04 LAB — GLUCOSE, CAPILLARY
GLUCOSE-CAPILLARY: 149 mg/dL — AB (ref 70–99)
Glucose-Capillary: 123 mg/dL — ABNORMAL HIGH (ref 70–99)
Glucose-Capillary: 87 mg/dL (ref 70–99)
Glucose-Capillary: 132 mg/dL — ABNORMAL HIGH (ref 70–99)

## 2013-05-04 MED ORDER — WARFARIN SODIUM 4 MG PO TABS
4.0000 mg | ORAL_TABLET | Freq: Once | ORAL | Status: AC
  Administered 2013-05-04: 4 mg via ORAL
  Filled 2013-05-04: qty 1

## 2013-05-04 MED ORDER — CARVEDILOL 25 MG PO TABS
25.0000 mg | ORAL_TABLET | Freq: Two times a day (BID) | ORAL | Status: DC
  Administered 2013-05-04 – 2013-05-05 (×2): 25 mg via ORAL
  Filled 2013-05-04 (×4): qty 1

## 2013-05-04 NOTE — Progress Notes (Signed)
[facility] PROGRESS NOTE  [name] [name] OEV:[id number] DOB: [birth year] DOA: 04/25/2013 PCP: Gaspar Skeeters, MD   Assessment/Plan: 63 y.o. male w/ PMHx of CAD s/p CABG, CHF w/ AICD/PPM (04/06/12), A-fib (on Coumadin) PAD s/p R. Femoral endarterectomy and R/ Iliac stent placement, CKD, HTN, HLD, DM type II, transferred from [facility] ED w/ likely septic shock 2/2 bacteremia and acute respiratory failure requiring BiPAP->intubated.   Acute respiratory failure extubated (1/30) no re tube/DNR -resolved, cont prn bronchodilators, oxygen;   Leukocytosis -monitor wound infection -on chronic steroids   Septic shock septic shock, recent right iliac endarderectomy, s/p debridement, wound c/s: MSSA -per VSS: Groin exploration and debridement and removal of bovine pericardial patch and placement of vein patch, saphenous vein. Changed to po abx 2/1.  HR increasing- will increase BB- ? Worsening due to infection- defer to vascular   DM: resume glyburide, januvia. stable BS controlled  CHF/AICD/PPM (04/06/12) echo (1/28): LVEF 20-25%, Diffuse hypokinesis, dilated cardiomyopathy  On coreg at lower dose ARB, lower dose. compensated   A fib on coumadin, HR uncontrolled with sepsis - resume dig, BB, coumadin; increase coreg for better HR control- BP 150s  CAD s/p CABG resume BB, ASA, Plavix; monitor   Hypokalemia: replete  GERD: change to po PPI  Nausea: PRN meds    Code Status: DNR Family Communication: at bedside Disposition Plan: home when ok with vascular   Consultants:  VSS  SIGNIFICANT EVENTS / STUDIES:  1/27 - Transferred from [place] ED w/ hypotension  1/27 - L. Femoral line placed, started on Levophed  1/28 OR R groin exploration and debridement w/ removal of bovine pericardial patch and replacement of vein patch, saphenous vein    LINES / TUBES:  L. Femoral CVL 1/27 >>>1/28  RIJ 1/28 >>>>  OETT 1/28 >>>   CULTURES:  Blood 1/27 >>>canceled  R.  Femoral wound 1/27 >>> Moderate Staph Aureus  R femoral wound 1/28 >>> 1/2 R G+ cocci in pairs  R femoral wound drainage 1/28 >>> Moderate stap aureus   ANTIBIOTICS:  Vancomycin 1/27 >>> 2/1 Cefepime 1/27 >>>1/30  Flagyl 1/28 >>>1/30  unasyn 1/30>>>2/1 levaquin 2/1  HPI/Subjective: Up taking a bath No CP, no SOB  Objective: Filed Vitals:   05/04/13 0800  BP: 158/83  Pulse: 139  Temp: 97.9 F (36.6 C)  Resp: 18    Intake/Output Summary (Last 24 hours) at 05/04/13 0957 Last data filed at 05/03/13 1900  Gross per 24 hour  Intake    480 ml  Output    875 ml  Net   -395 ml   Filed Weights   05/01/13 2044 05/02/13 2000 05/03/13 2056  Weight: 93.3 kg (205 lb 11 oz) 91.354 kg (201 lb 6.4 oz) 90.7 kg (199 lb 15.3 oz)    Exam:   General:  NAD, up taking a bath  Cardiovascular: s1,s2 rrr  Respiratory: CTA without WRR  Abdomen: soft, nt, nd   Musculoskeletal: Right groin wound with dressing  Data Reviewed: Basic Metabolic Panel:  Recent Labs Lab 04/28/13 0425 04/30/13 0650 05/01/13 0615 05/02/13 0602 05/04/13 0529  NA 139 142 144 144 143  K 4.9 3.2* 3.3* 3.9 4.5  CL 105 103 104 104 102  CO2 19 24 27 26 24   GLUCOSE 198* 124* 145* 150* 151*  BUN 23 18 13 12 11   CREATININE 0.88 0.71 0.67 0.69 0.74  CALCIUM 8.0* 8.5 8.4 8.2* 9.0  MG  --   --  1.6  --   --  Liver Function Tests: No results found for this basename: AST, ALT, ALKPHOS, BILITOT, PROT, ALBUMIN,  in the last 168 hours No results found for this basename: LIPASE, AMYLASE,  in the last 168 hours No results found for this basename: AMMONIA,  in the last 168 hours CBC:  Recent Labs Lab 04/28/13 0425 04/30/13 0650 05/02/13 0602 05/04/13 0529  WBC 8.5 7.9 9.2 11.5*  HGB 8.9* 10.1* 10.7* 11.5*  HCT 27.1* 31.2* 33.4* 37.2*  MCV 83.4 84.1 84.8 87.1  PLT 93* 157 219 283   Cardiac Enzymes: No results found for this basename: CKTOTAL, CKMB, CKMBINDEX, TROPONINI,  in the last 168 hours BNP (last  3 results)  Recent Labs  03/16/13 0405 03/19/13 0430 04/25/13 2205  PROBNP 1650.0* 2143.0* 8808.0*   CBG:  Recent Labs Lab 05/03/13 0756 05/03/13 1215 05/03/13 1649 05/03/13 2106 05/04/13 0755  GLUCAP 211* 131* 181* 143* 123*    Recent Results (from the past 240 hour(s))  WOUND CULTURE     Status: None   Collection Time    04/25/13 10:10 PM      Result Value Range Status   Specimen Description WOUND GROIN RIGHT   Final   Special Requests Normal   Final   Gram Stain     Final   Value: NO WBC SEEN     NO SQUAMOUS EPITHELIAL CELLS SEEN     NO ORGANISMS SEEN     Performed at [facility]   Culture     Final   Value: MODERATE STAPHYLOCOCCUS AUREUS     Note: RIFAMPIN AND GENTAMICIN SHOULD NOT BE USED AS SINGLE DRUGS FOR TREATMENT OF STAPH INFECTIONS.     Performed at [facility]   Report Status 04/28/2013 FINAL   Final   Organism ID, Bacteria STAPHYLOCOCCUS AUREUS   Final  WOUND CULTURE     Status: None   Collection Time    04/26/13 12:05 AM      Result Value Range Status   Specimen Description WOUND RIGHT GROIN DRAINAGE   Final   Special Requests Normal   Final   Gram Stain     Final   Value: NO WBC SEEN     NO SQUAMOUS EPITHELIAL CELLS SEEN     NO ORGANISMS SEEN     Performed at [facility]   Culture     Final   Value: MODERATE STAPHYLOCOCCUS AUREUS     Note: RIFAMPIN AND GENTAMICIN SHOULD NOT BE USED AS SINGLE DRUGS FOR TREATMENT OF STAPH INFECTIONS.     Performed at [facility]   Report Status 04/28/2013 FINAL   Final   Organism ID, Bacteria STAPHYLOCOCCUS AUREUS   Final  MRSA PCR SCREENING     Status: None   Collection Time    04/26/13  1:48 AM      Result Value Range Status   MRSA by PCR NEGATIVE  NEGATIVE Final   Comment:            The GeneXpert MRSA Assay (FDA     approved for NASAL specimens     only), is one component of a     comprehensive MRSA colonization     surveillance program. It is not     intended to  diagnose MRSA     infection nor to guide or     monitor treatment for     MRSA infections.  WOUND CULTURE     Status: None   Collection Time  04/26/13  1:43 PM      Result Value Range Status   Specimen Description WOUND RIGHT GROIN   Final   Special Requests SPECIMEN NO.1 PT ON FLAGYL,VANCOMYCIN,MAXIPINE   Final   Gram Stain     Final   Value: NO WBC SEEN     NO SQUAMOUS EPITHELIAL CELLS SEEN     NO ORGANISMS SEEN     Performed at [facility]   Culture     Final   Value: FEW STAPHYLOCOCCUS AUREUS     Note: RIFAMPIN AND GENTAMICIN SHOULD NOT BE USED AS SINGLE DRUGS FOR TREATMENT OF STAPH INFECTIONS.     Performed at [facility]   Report Status 04/29/2013 FINAL   Final   Organism ID, Bacteria STAPHYLOCOCCUS AUREUS   Final  WOUND CULTURE     Status: None   Collection Time    04/26/13  1:46 PM      Result Value Range Status   Specimen Description WOUND RIGHT GROIN   Final   Special Requests SPECIMEN NO. 2 PT ON FLAGYL,VANCOMYCIN,MAXIPINE   Final   Gram Stain     Final   Value: RARE WBC PRESENT,BOTH PMN AND MONONUCLEAR     NO SQUAMOUS EPITHELIAL CELLS SEEN     RARE GRAM POSITIVE COCCI IN PAIRS     Performed at [facility]   Culture     Final   Value: MODERATE STAPHYLOCOCCUS AUREUS     Note: RIFAMPIN AND GENTAMICIN SHOULD NOT BE USED AS SINGLE DRUGS FOR TREATMENT OF STAPH INFECTIONS.     Performed at [facility]   Report Status 04/29/2013 FINAL   Final   Organism ID, Bacteria STAPHYLOCOCCUS AUREUS   Final  CLOSTRIDIUM DIFFICILE BY PCR     Status: None   Collection Time    04/28/13  2:00 PM      Result Value Range Status   C difficile by pcr NEGATIVE  NEGATIVE Final     Studies: No results found.  Scheduled Meds: . antiseptic oral rinse  15 mL Mouth Rinse QID  . aspirin EC  81 mg Oral Daily  . carvedilol  25 mg Oral BID WC  . chlorhexidine  15 mL Mouth Rinse BID  . clopidogrel  75 mg Oral Q breakfast  . digoxin  0.125 mg Oral Daily   . ezetimibe  10 mg Oral QHS  . feeding supplement (GLUCERNA SHAKE)  237 mL Oral Q24H  . furosemide  40 mg Oral Daily  . gabapentin  300 mg Oral BID  . glyBURIDE  5 mg Oral BID WC  . insulin aspart  0-20 Units Subcutaneous TID WC  . isosorbide mononitrate  30 mg Oral Daily  . levofloxacin  500 mg Oral Daily  . linagliptin  5 mg Oral Daily  . losartan  25 mg Oral Daily  . pantoprazole  40 mg Oral BID  . predniSONE  5 mg Oral BID  . Warfarin - Pharmacist Dosing Inpatient   Does not apply q1800   Continuous Infusions:   Time spent: 25 minutes   Marlin Canary  [facility] Pager [phone]. If 7PM-7AM, please contact night-coverage at [email or web address], password [facility] 05/04/2013, 9:57 AM  LOS: 9 days

## 2013-05-04 NOTE — Progress Notes (Signed)
ANTICOAGULATION CONSULT NOTE - Follow Up Consult  Pharmacy Consult for coumadin Indication: atrial fibrillation  Allergies  Allergen Reactions  . Lipitor [Atorvastatin] Other (See Comments)    Weak muscles  . Lodine [Etodolac] Nausea And Vomiting  . Methotrexate Derivatives Nausea And Vomiting  . Remicade [Infliximab] Other (See Comments)    Chills and shakes   . Ranitidine Hcl Nausea Only    Patient Measurements: Height: 5\' 9"  (175.3 cm) Weight: 199 lb 15.3 oz (90.7 kg) IBW/kg (Calculated) : 70.7  Vital Signs: Temp: 97.9 F (36.6 C) (02/05 0800) Temp src: Oral (02/05 0800) BP: 158/83 mmHg (02/05 0800) Pulse Rate: 139 (02/05 0800)  Labs:  Recent Labs  05/02/13 0602 05/03/13 0550 05/04/13 0529  HGB 10.7*  --  11.5*  HCT 33.4*  --  37.2*  PLT 219  --  283  LABPROT 22.8* 28.2* 23.3*  INR 2.09* 2.76* 2.15*  CREATININE 0.69  --  0.74    Estimated Creatinine Clearance: 109.3 ml/min (by C-G formula based on Cr of 0.74).   Assessment: 63 yo M admitted on 1/27 for a poorly healing right groin wound and sepsis. He is on chronic coumadin therapy for atrial fibrillation though it has been held during this admission. Patient is s/p I & D, and coumadin was restarted per pharmacy.   Patient's home dose is 5 mg daily. Patient states he has been stable on this dose for a "long time".   INR is therapeutic today at 2.15. CBC improved: Hgb 11.5, platelet count 283. No bleeding noted. He is eating and ambulating better.   Mr. Klatt is also on Plavix and aspirin.  Goal of Therapy:  INR 2-3 Monitor platelets by anticoagulation protocol: Yes   Plan:  - Coumadin 4 mg PO x 1 dose tonight - Daily INR - F/U s/s of bleeding   Roma Kayser, PharmD.  Clinical Pharmacist Pager [phone]

## 2013-05-05 ENCOUNTER — Telehealth: Payer: Self-pay | Admitting: Surgery

## 2013-05-05 LAB — GLUCOSE, CAPILLARY
GLUCOSE-CAPILLARY: 156 mg/dL — AB (ref 70–99)
Glucose-Capillary: 107 mg/dL — ABNORMAL HIGH (ref 70–99)

## 2013-05-05 LAB — PROTIME-INR
INR: 2.04 — ABNORMAL HIGH (ref 0.00–1.49)
Prothrombin Time: 22.4 s — ABNORMAL HIGH (ref 11.6–15.2)

## 2013-05-05 MED ORDER — OXYCODONE-ACETAMINOPHEN 5-325 MG PO TABS: 1.0000 | ORAL_TABLET | ORAL | Status: DC | PRN

## 2013-05-05 MED ORDER — WARFARIN SODIUM 5 MG PO TABS: 5.0000 mg | ORAL_TABLET | Freq: Every day | ORAL | Status: DC

## 2013-05-05 MED ORDER — LEVOFLOXACIN 500 MG PO TABS: 500.0000 mg | ORAL_TABLET | Freq: Every day | ORAL | Status: DC

## 2013-05-05 MED ORDER — LOSARTAN POTASSIUM 25 MG PO TABS: 25.0000 mg | ORAL_TABLET | Freq: Every day | ORAL | Status: AC

## 2013-05-05 MED ORDER — PANTOPRAZOLE SODIUM 40 MG PO TBEC: 40.0000 mg | DELAYED_RELEASE_TABLET | Freq: Two times a day (BID) | ORAL | Status: AC

## 2013-05-05 MED ORDER — GABAPENTIN 300 MG PO CAPS: 300.0000 mg | ORAL_CAPSULE | Freq: Two times a day (BID) | ORAL | Status: DC

## 2013-05-05 MED ORDER — WARFARIN SODIUM 5 MG PO TABS
5.0000 mg | ORAL_TABLET | Freq: Once | ORAL | Status: DC
  Filled 2013-05-05: qty 1

## 2013-05-05 MED ORDER — GLYBURIDE 5 MG PO TABS: 5.0000 mg | ORAL_TABLET | Freq: Two times a day (BID) | ORAL | Status: DC

## 2013-05-05 MED ORDER — FUROSEMIDE 40 MG PO TABS: 40.0000 mg | ORAL_TABLET | Freq: Every day | ORAL | Status: AC

## 2013-05-05 NOTE — Care Management Note (Addendum)
   CARE MANAGEMENT NOTE 05/05/2013  Patient:  [name] [name], [name] [name]   Account Number:  [id number]  Date Initiated:  04/26/2013  Documentation initiated by:  [facility]  Subjective/Objective Assessment:   Tx from outside hospital - septic.     Action/Plan:   05/01/13 Met with pt and wife and arranged [place] with [facility] for dressing changes to begin 05/03/13 pm.   Anticipated DC Date:  05/05/2013   Anticipated DC Plan:  [place]  CM consult      Choice offered to / List presented to:          [facility] arranged  HH-1 RN      Status of service:  Completed, signed off Medicare Important Message given?   (If response is "NO", the following Medicare IM given date fields will be blank) Date Medicare IM given:   Date Additional Medicare IM given:    Discharge Disposition:  [place]  Per UR Regulation:  Reviewed for med. necessity/level of care/duration of stay  If discussed at [place] of Stay Meetings, dates discussed:    Comments:  Contact:  [place], [place] [phone]                  [place], [place] Daughter     [phone]   05/05/2013 Ray notified of plan to d/c pt to home today. They will update and see pt within the next 24-48hr.   Jasmine Pang RN MPH, case manager, [phone] [place]: family given phone number for [facility] [phone] if needed once pt is d/c.   CRoyal RN MPH  05/02/2013 [facility] [place] [place] will provide [facility] services for this pt to teach the wife dressing changes and to reinforce any DM management needs. Pt reported that he had a INR meter in his home and was concerned that he may be charged for this as he has been in the hospital an unable to use the instrument. This CM was able to learn that the meter was supplied by [employer]. [employer] was notifed of pt hospitalization, and Maudie Mercury of [employer] called to state that the pt will be fine with the meter, he just needs to check his INR and  call this in as soon as he gets home. Jasmine Pang RN MPH, care management, [phone]

## 2013-05-05 NOTE — Progress Notes (Signed)
I have seen and examined the patient today.  I changed the dressing with his wife.  There continues to be a fair amount of fluid here.  I recommended changing dressings to 3 times daily.  I will make a decision tomorrow as to whether or not he needs to go back to the operating room or we can continue with dressing changes.  I would continue antibiotics.

## 2013-05-05 NOTE — Discharge Summary (Signed)
Physician Discharge Summary  [name] KPT:[id number] DOB: 03/31/1950 DOA: 04/25/2013  PCP: Gaspar Skeeters, MD  Admit date: 04/25/2013 Discharge date: 05/05/2013  Time spent: 35 minutes  Recommendations for Outpatient Follow-up:  1. Home health 2. PT/INR on Monday dressing changes at least tid and as needed CBC, bmp 1 week   Discharge Diagnoses:  Principal Problem:   Septic shock Active Problems:   Peripheral vascular disease, unspecified   Diabetes mellitus type 2 with peripheral artery disease   CAD (coronary artery disease)   Shock   Warfarin anticoagulation   ICD (implantable cardioverter-defibrillator) in place   Chronic systolic heart failure   Essential hypertension, benign   Rheumatoid arthritis   Chronic steroid use   Debility   GERD (gastroesophageal reflux disease)   Other and unspecified hyperlipidemia   Nausea alone   Discharge Condition: improved  Diet recommendation: cardiac/diabetic  Filed Weights   05/02/13 2000 05/03/13 2056 05/04/13 2107  Weight: 91.354 kg (201 lb 6.4 oz) 90.7 kg (199 lb 15.3 oz) 91.8 kg (202 lb 6.1 oz)    History of present illness:  [name] is a 63 y.o. male w/ PMHx of CAD s/p CABG, CHF w/ AICD/PPM (04/06/12), A-fib (on Coumadin) PAD s/p R. Femoral endarterectomy and R/ Iliac stent placement, CKD, HTN, HLD, DM type II, transferred from [facility] ED w/ likely septic shock 2/2 bacteremia and acute respiratory failure requiring BiPAP.    Hospital Course:   Acute respiratory failure extubated (1/30) no re tube/DNR  -resolved  Leukocytosis  -monitor wound infection  -on chronic steroids -patient to follow up with vascular- being d/c'd on abx  Septic shock septic shock, recent right iliac endarderectomy, s/p debridement, wound c/s: MSSA  -per VSS: Groin exploration and debridement and removal of bovine pericardial patch and placement of vein patch, saphenous vein. Changed to po abx 2/1.    DM:  resume glyburide, januvia. stable  BS controlled   CHF/AICD/PPM (04/06/12) echo (1/28): LVEF 20-25%, Diffuse hypokinesis, dilated cardiomyopathy  Coreg, ARB -may need to increase lasix back to BID as patient improved  A fib on coumadin, HR uncontrolled with sepsis  - resume dig, BB, coumadin; increase coreg for better HR control- BP 150s   CAD s/p CABG resume BB, ASA, Plavix; monitor   Hypokalemia: replete   GERD: change to po PPI   Nausea: PRN meds      Procedures: 1/27 - Transferred from [place] ED w/ hypotension  1/27 - L. Femoral line placed, started on Levophed  1/28 OR R groin exploration and debridement w/ removal of bovine pericardial patch and replacement of vein patch, saphenous vein    Consultations:  vascular  Discharge Exam: Filed Vitals:   05/05/13 0553  BP: 147/86  Pulse: 125  Temp: 97.8 F (36.6 C)  Resp: 20    General: pleasant/cooperative- family at bedside Cardiovascular: rrr Respiratory: clear anterior  Discharge Instructions      Discharge Orders   Future Appointments Provider Department Dept Phone   05/10/2013 1:00 PM [facility] [place] [facility] [address] [phone]   05/10/2013 1:45 PM Chuck Hint, MD [facility] [phone]   Future Orders Complete By Expires   Diet - low sodium heart healthy  As directed    Diet Carb Modified  As directed    Discharge instructions  As directed    Comments:     Pt/INR on Monday Follow up vascular in 1 week dressing changes at least tid and as  needed   Increase activity slowly  As directed        Medication List    STOP taking these medications       calcium carbonate 500 MG chewable tablet  Commonly known as:  TUMS - dosed in mg elemental calcium     clobetasol cream 0.05 %  Commonly known as:  TEMOVATE     gabapentin 600 MG tablet  Commonly known as:  NEURONTIN  Replaced by:  gabapentin 300 MG capsule      glyBURIDE-metformin 5-500 MG per tablet  Commonly known as:  GLUCOVANCE     hydrALAZINE 25 MG tablet  Commonly known as:  APRESOLINE     HYDROcodone-acetaminophen 10-325 MG per tablet  Commonly known as:  NORCO     ORENCIA [place]     potassium chloride SA 20 MEQ tablet  Commonly known as:  K-DUR,KLOR-CON      TAKE these medications       aspirin EC 81 MG tablet  Take 81 mg by mouth daily.     CALCIUM 1000 + D PO  Take 1 tablet by mouth daily.     carvedilol 12.5 MG tablet  Commonly known as:  COREG  Take 25 mg by mouth 2 (two) times daily with a meal.     clopidogrel 75 MG tablet  Commonly known as:  PLAVIX  Take 75 mg by mouth daily.     digoxin 0.125 MG tablet  Commonly known as:  LANOXIN  Take 1 tablet (0.125 mg total) by mouth daily.     ezetimibe 10 MG tablet  Commonly known as:  ZETIA  Take 10 mg by mouth at bedtime.     feeding supplement (GLUCERNA SHAKE) Liqd  Take 237 mLs by mouth daily at 3 pm.     ferrous gluconate 325 MG tablet  Commonly known as:  FERGON  Take 325 mg by mouth daily with breakfast.     fish oil-omega-3 fatty acids 1000 MG capsule  Take 2 g by mouth daily.     furosemide 40 MG tablet  Commonly known as:  LASIX  Take 1 tablet (40 mg total) by mouth daily.     gabapentin 300 MG capsule  Commonly known as:  NEURONTIN  Take 1 capsule (300 mg total) by mouth 2 (two) times daily.     glyBURIDE 5 MG tablet  Commonly known as:  DIABETA  Take 1 tablet (5 mg total) by mouth 2 (two) times daily with a meal.     isosorbide mononitrate 60 MG 24 hr tablet  Commonly known as:  IMDUR  Take 30 mg by mouth daily.     levofloxacin 500 MG tablet  Commonly known as:  LEVAQUIN  Take 1 tablet (500 mg total) by mouth daily.     losartan 25 MG tablet  Commonly known as:  COZAAR  Take 1 tablet (25 mg total) by mouth daily.     Magnesium 100 MG Tabs  Take 1 tablet by mouth daily.     nitroGLYCERIN 0.4 MG SL tablet  Commonly known as:   NITROSTAT  Place 1 tablet (0.4 mg total) under the tongue every 5 (five) minutes x 3 doses as needed for chest pain.     oxyCODONE-acetaminophen 5-325 MG per tablet  Commonly known as:  PERCOCET/ROXICET  Take 1-2 tablets by mouth every 4 (four) hours as needed for severe pain.     pantoprazole 40 MG tablet  Commonly known as:  PROTONIX  Take 1 tablet (40 mg total) by mouth 2 (two) times daily.     predniSONE 5 MG tablet  Commonly known as:  DELTASONE  Take 5 mg by mouth 2 (two) times daily.     sitaGLIPtin 100 MG tablet  Commonly known as:  JANUVIA  Take 100 mg by mouth at bedtime.     warfarin 5 MG tablet  Commonly known as:  COUMADIN  Take 1 tablet (5 mg total) by mouth daily at 6 PM.       Allergies  Allergen Reactions  . Lipitor [Atorvastatin] Other (See Comments)    Weak muscles  . Lodine [Etodolac] Nausea And Vomiting  . Methotrexate Derivatives Nausea And Vomiting  . Remicade [Infliximab] Other (See Comments)    Chills and shakes   . Ranitidine Hcl Nausea Only   Follow-up Information   Follow up with Myra Gianotti IV, Lala Lund, MD In 1 week. (Office will call you to arrange your appt (sent))    Specialty:  Vascular Surgery   Contact information:   [address] [place] [address] [phone]       Follow up with [facility], MD In 1 week.   Specialty:  Family Medicine   Contact information:   [address] [place] [address] [phone]        The results of significant diagnostics from this hospitalization (including imaging, microbiology, ancillary and laboratory) are listed below for reference.    Significant Diagnostic Studies: Dg Chest Port 1 View  04/28/2013   CLINICAL DATA:  Endotracheal tube replacement.  EXAM: PORTABLE CHEST - 1 VIEW  COMPARISON:  One-view chest 04/27/2013.  FINDINGS: The endotracheal tube has been repositioned. It now terminates 4 cm above the carina. The NG tube courses off the inferior border the film. A right IJ  line is stable. Lung volumes are slightly improved. Left basilar airspace disease persists. A small left pleural effusion is suspected. Mild pulmonary vascular congestion is improved.  IMPRESSION: 1. Repositioning of the endotracheal tube, now in satisfactory position. 2. Improved lung volumes. 3. Persistent left basilar airspace disease likely representing atelectasis. 4. Suspect small left pleural effusion. 5. Decreasing pulmonary vascular congestion.   Electronically Signed   By: Gennette Pac M.D.   On: 04/28/2013 07:28   Dg Chest Port 1 View  04/27/2013   CLINICAL DATA:  Respiratory difficulty  EXAM: PORTABLE CHEST - 1 VIEW  COMPARISON:  Yesterday  FINDINGS: Endotracheal tube tip is 6 mm above the carina. NG tube, right internal jugular central venous catheter, left subclavian AICD are stable. Moderate cardiomegaly. Very low lung volumes. Vascular crowding without pulmonary edema. No pneumothorax.  IMPRESSION: Very low volumes and cardiomegaly are stable.  Endotracheal tube tip is 6 mm of the carina.   Electronically Signed   By: Maryclare Bean M.D.   On: 04/27/2013 07:56   Dg Chest Port 1 View  04/26/2013   CLINICAL DATA:  Central line placement intubation.  EXAM: PORTABLE CHEST - 1 VIEW  COMPARISON:  04/25/2013  FINDINGS: Endotracheal tube has its tip 1 cm above the carina. Nasogastric tube enters the abdomen. Right internal jugular central line has its tip in the SVC just above the right atrium. Pacemaker appears unchanged grossly. The lungs are clear and well aerated. No edema.  IMPRESSION: Endotracheal tube, nasogastric tube and right internal jugular central line acceptably positioned. Lungs remain clear.   Electronically Signed   By: Paulina Fusi M.D.   On: 04/26/2013 12:30   Dg Chest Portable  1 View  04/25/2013   CLINICAL DATA:  Shortness of breath, respiratory distress  EXAM: PORTABLE CHEST - 1 VIEW  COMPARISON:  The 07/17/2012  FINDINGS: Prior median sternotomy noted. Left subclavian pacer  evident. Cardiomegaly without CHF or pneumonia. Lungs clear. Defibrillator pad overlies the right chest. No effusion or pneumothorax. Trachea midline.  IMPRESSION: Cardiomegaly without acute process   Electronically Signed   By: Ruel Favors M.D.   On: 04/25/2013 22:39    Microbiology: Recent Results (from the past 240 hour(s))  WOUND CULTURE     Status: None   Collection Time    04/25/13 10:10 PM      Result Value Range Status   Specimen Description WOUND GROIN RIGHT   Final   Special Requests Normal   Final   Gram Stain     Final   Value: NO WBC SEEN     NO SQUAMOUS EPITHELIAL CELLS SEEN     NO ORGANISMS SEEN     Performed at [facility]   Culture     Final   Value: MODERATE STAPHYLOCOCCUS AUREUS     Note: RIFAMPIN AND GENTAMICIN SHOULD NOT BE USED AS SINGLE DRUGS FOR TREATMENT OF STAPH INFECTIONS.     Performed at [facility]   Report Status 04/28/2013 FINAL   Final   Organism ID, Bacteria STAPHYLOCOCCUS AUREUS   Final  WOUND CULTURE     Status: None   Collection Time    04/26/13 12:05 AM      Result Value Range Status   Specimen Description WOUND RIGHT GROIN DRAINAGE   Final   Special Requests Normal   Final   Gram Stain     Final   Value: NO WBC SEEN     NO SQUAMOUS EPITHELIAL CELLS SEEN     NO ORGANISMS SEEN     Performed at [facility]   Culture     Final   Value: MODERATE STAPHYLOCOCCUS AUREUS     Note: RIFAMPIN AND GENTAMICIN SHOULD NOT BE USED AS SINGLE DRUGS FOR TREATMENT OF STAPH INFECTIONS.     Performed at [facility]   Report Status 04/28/2013 FINAL   Final   Organism ID, Bacteria STAPHYLOCOCCUS AUREUS   Final  MRSA PCR SCREENING     Status: None   Collection Time    04/26/13  1:48 AM      Result Value Range Status   MRSA by PCR NEGATIVE  NEGATIVE Final   Comment:            The GeneXpert MRSA Assay (FDA     approved for NASAL specimens     only), is one component of a     comprehensive MRSA colonization      surveillance program. It is not     intended to diagnose MRSA     infection nor to guide or     monitor treatment for     MRSA infections.  WOUND CULTURE     Status: None   Collection Time    04/26/13  1:43 PM      Result Value Range Status   Specimen Description WOUND RIGHT GROIN   Final   Special Requests SPECIMEN NO.1 PT ON FLAGYL,VANCOMYCIN,MAXIPINE   Final   Gram Stain     Final   Value: NO WBC SEEN     NO SQUAMOUS EPITHELIAL CELLS SEEN     NO ORGANISMS SEEN     Performed at [facility]  Culture     Final   Value: FEW STAPHYLOCOCCUS AUREUS     Note: RIFAMPIN AND GENTAMICIN SHOULD NOT BE USED AS SINGLE DRUGS FOR TREATMENT OF STAPH INFECTIONS.     Performed at [facility]   Report Status 04/29/2013 FINAL   Final   Organism ID, Bacteria STAPHYLOCOCCUS AUREUS   Final  WOUND CULTURE     Status: None   Collection Time    04/26/13  1:46 PM      Result Value Range Status   Specimen Description WOUND RIGHT GROIN   Final   Special Requests SPECIMEN NO. 2 PT ON FLAGYL,VANCOMYCIN,MAXIPINE   Final   Gram Stain     Final   Value: RARE WBC PRESENT,BOTH PMN AND MONONUCLEAR     NO SQUAMOUS EPITHELIAL CELLS SEEN     RARE GRAM POSITIVE COCCI IN PAIRS     Performed at [facility]   Culture     Final   Value: MODERATE STAPHYLOCOCCUS AUREUS     Note: RIFAMPIN AND GENTAMICIN SHOULD NOT BE USED AS SINGLE DRUGS FOR TREATMENT OF STAPH INFECTIONS.     Performed at [facility]   Report Status 04/29/2013 FINAL   Final   Organism ID, Bacteria STAPHYLOCOCCUS AUREUS   Final  CLOSTRIDIUM DIFFICILE BY PCR     Status: None   Collection Time    04/28/13  2:00 PM      Result Value Range Status   C difficile by pcr NEGATIVE  NEGATIVE Final     Labs: Basic Metabolic Panel:  Recent Labs Lab 04/30/13 0650 05/01/13 0615 05/02/13 0602 05/04/13 0529  NA 142 144 144 143  K 3.2* 3.3* 3.9 4.5  CL 103 104 104 102  CO2 24 27 26 24   GLUCOSE 124* 145* 150* 151*  BUN  18 13 12 11   CREATININE 0.71 0.67 0.69 0.74  CALCIUM 8.5 8.4 8.2* 9.0  MG  --  1.6  --   --    Liver Function Tests: No results found for this basename: AST, ALT, ALKPHOS, BILITOT, PROT, ALBUMIN,  in the last 168 hours No results found for this basename: LIPASE, AMYLASE,  in the last 168 hours No results found for this basename: AMMONIA,  in the last 168 hours CBC:  Recent Labs Lab 04/30/13 0650 05/02/13 0602 05/04/13 0529  WBC 7.9 9.2 11.5*  HGB 10.1* 10.7* 11.5*  HCT 31.2* 33.4* 37.2*  MCV 84.1 84.8 87.1  PLT 157 219 283   Cardiac Enzymes: No results found for this basename: CKTOTAL, CKMB, CKMBINDEX, TROPONINI,  in the last 168 hours BNP: BNP (last 3 results)  Recent Labs  03/16/13 0405 03/19/13 0430 04/25/13 2205  PROBNP 1650.0* 2143.0* 8808.0*   CBG:  Recent Labs Lab 05/04/13 0755 05/04/13 1145 05/04/13 1615 05/04/13 2104 05/05/13 0805  GLUCAP 123* 149* 87 132* 107*       Signed:  VANN, JESSICA  [facility] 05/05/2013, 10:53 AM

## 2013-05-05 NOTE — Progress Notes (Signed)
[facility] Progress Note Late entry for 05/04/13   05/05/2013 9:14 AM 9 Days Post-Op  Subjective:  No complaints today  afebrile  Filed Vitals:   05/05/13 0553  BP: 147/86  Pulse: 125  Temp: 97.8 F (36.6 C)  Resp: 20    Physical Exam: Groin wound improved today per Dr. Myra Gianotti.  Less drainage.  CBC    Component Value Date/Time   WBC 11.5* 05/04/2013 0529   RBC 4.27 05/04/2013 0529   HGB 11.5* 05/04/2013 0529   HCT 37.2* 05/04/2013 0529   PLT 283 05/04/2013 0529   MCV 87.1 05/04/2013 0529   MCH 26.9 05/04/2013 0529   MCHC 30.9 05/04/2013 0529   RDW 20.4* 05/04/2013 0529   LYMPHSABS 1.0 03/20/2013 0300   MONOABS 1.1* 03/20/2013 0300   EOSABS 0.1 03/20/2013 0300   BASOSABS 0.0 03/20/2013 0300    BMET    Component Value Date/Time   NA 143 05/04/2013 0529   K 4.5 05/04/2013 0529   CL 102 05/04/2013 0529   CO2 24 05/04/2013 0529   GLUCOSE 151* 05/04/2013 0529   BUN 11 05/04/2013 0529   CREATININE 0.74 05/04/2013 0529   CALCIUM 9.0 05/04/2013 0529   GFRNONAA >90 05/04/2013 0529   GFRAA >90 05/04/2013 0529    INR    Component Value Date/Time   INR 2.04* 05/05/2013 0335     Intake/Output Summary (Last 24 hours) at 05/05/13 0914 Last data filed at 05/04/13 1831  Gross per 24 hour  Intake    600 ml  Output      0 ml  Net    600 ml     Assessment:  63 y.o. male is s/p:  Right groin infection s/p endarterectomy and patch with bovine pericardial patch  9 Days Post-Op  Plan: -pt doing well -wound improving. -daughter changed dressing and wife has been changing dressing.  They are given instructions on wound care for home. -may be discharged from vascular standpoint and f/u with Dr. Myra Gianotti in one week. -once there is more granulation tissue, he will be candidate for wound vac, but continue dressing changes at least tid and as needed.  This was discussed with pt and his daughter.   Doreatha Massed, PA-C [facility] [phone] 05/05/2013 9:14 AM  I  agree with the above.  That wound has made progress.  There appears to be adequate coverage of the artery, however I do not feel it is ready for a wound VAC yet.  I would be okay with discharging him to home, continuing his antibiotics for at least 6 weeks.  I'll have a followup in 2 weeks in my office.  Durene Cal

## 2013-05-05 NOTE — Telephone Encounter (Addendum)
Message copied by Fredrich Birks on Fri May 05, 2013  4:18 PM ------      Message from: Phillips Odor      Created: Fri May 05, 2013  9:26 AM                   ----- Message -----         From: Dara Lords, PA-C         Sent: 05/05/2013   9:18 AM           To: Vvs Charge Pool            S/p       Right groin infection s/p endarterectomy and patch with bovine pericardial patch        9 Days Post-Op            Pt to f/u with Dr. Myra Gianotti in one week for wound check. (2/16)            Thanks,      Lelon Mast ------  05/05/13: spoke with pts wife to schedule, dpm

## 2013-05-05 NOTE — Progress Notes (Signed)
Patient discharge Home per Md order.  Discharge instructions reviewed with patient and family.  Copies of all forms given and explained. Patient/family voiced understanding of all instructions.  Discharge in no acute distress. Franchesca Veneziano B. Jacqueli Pangallo, RN BC, BSN, MSN 

## 2013-05-05 NOTE — Progress Notes (Signed)
Physical Therapy Treatment Patient Details Name: [name] [name] MRN: [id number] DOB: [birth year] Today's Date: 05/05/2013 Time: 9983-3825 PT Time Calculation (min): 15 min  PT Assessment / Plan / Recommendation  History of Present Illness Pt admitted for I&D of R groin abcess.  PMHx of CAD s/p CABG, CHF w/ AICD/PPM (04/06/12), A-fib (on Coumadin) PAD s/p R. Femoral endarterectomy and R/ Iliac stent placement, CKD, HTN, HLD, DM type II, transferred from [facility] ED w/ likely septic shock 2/2 bacteremia and acute respiratory failure requiring BiPAP->intubated   PT Comments   Patient planned d/c home later today.  Feels weak after insulin and waiting lunch tray so mobility deferred.  Did educate in stairs for home entry and car transfers.  Feel HHPT will be beneficial at home after d/c.  Follow Up Recommendations  Home health PT;Supervision/Assistance - 24 hour     Does the patient have the potential to tolerate intense rehabilitation   N/A  Barriers to Discharge  None      Equipment Recommendations  3in1 (PT)    Recommendations for Other Services  None  Frequency Min 3X/week   Progress towards PT Goals Progress towards PT goals: Progressing toward goals  Plan Current plan remains appropriate    Precautions / Restrictions Precautions Precautions: Fall   Pertinent Vitals/Pain No pain complaints    Mobility  Bed Mobility General bed mobility comments: NT due to patient weak after insulin and no lunch tray yet (up in chair wife in room)    Exercises Other Exercises Other Exercises: Educated in technique/sequence for stair negotiation for home entry with visual demo and handout issued. Other Exercises: Educated in technique for car transfers.     PT Goals (current goals can now be found in the care plan section)    Visit Information  Last PT Received On: 05/05/13 Assistance Needed: +1 History of Present Illness: Pt admitted for I&D of R groin abcess.  PMHx of CAD s/p  CABG, CHF w/ AICD/PPM (04/06/12), A-fib (on Coumadin) PAD s/p R. Femoral endarterectomy and R/ Iliac stent placement, CKD, HTN, HLD, DM type II, transferred from [facility] ED w/ likely septic shock 2/2 bacteremia and acute respiratory failure requiring BiPAP->intubated    Subjective Data   They say I am going home today.   Cognition  Cognition Arousal/Alertness: Awake/alert Behavior During Therapy: WFL for tasks assessed/performed Overall Cognitive Status: Within Functional Limits for tasks assessed    Balance     End of Session PT - End of Session Activity Tolerance: Other (comment) (limited due to low blood sugar) Patient left: in chair;with call bell/phone within reach;with family/visitor present   GP     [facility] 05/05/2013, 1:38 PM Sheran Lawless, PT [phone] 05/05/2013

## 2013-05-05 NOTE — Progress Notes (Signed)
ANTICOAGULATION CONSULT NOTE - Follow Up Consult  Pharmacy Consult for coumadin Indication: atrial fibrillation  Allergies  Allergen Reactions  . Lipitor [Atorvastatin] Other (See Comments)    Weak muscles  . Lodine [Etodolac] Nausea And Vomiting  . Methotrexate Derivatives Nausea And Vomiting  . Remicade [Infliximab] Other (See Comments)    Chills and shakes   . Ranitidine Hcl Nausea Only    Patient Measurements: Height: 5\' 9"  (175.3 cm) Weight: 202 lb 6.1 oz (91.8 kg) IBW/kg (Calculated) : 70.7  Vital Signs: Temp: 97.8 F (36.6 C) (02/06 0553) Temp src: Oral (02/06 0553) BP: 147/86 mmHg (02/06 0553) Pulse Rate: 125 (02/06 0553)  Labs:  Recent Labs  05/03/13 0550 05/04/13 0529 05/05/13 0335  HGB  --  11.5*  --   HCT  --  37.2*  --   PLT  --  283  --   LABPROT 28.2* 23.3* 22.4*  INR 2.76* 2.15* 2.04*  CREATININE  --  0.74  --     Estimated Creatinine Clearance: 109.9 ml/min (by C-G formula based on Cr of 0.74).   Assessment: 63 yo M admitted on 1/27 for a poorly healing right groin wound and sepsis. He is on chronic coumadin therapy for atrial fibrillation though it has been held during this admission. Patient is s/p I & D, and coumadin was restarted per pharmacy.   Patient's home dose is 5 mg daily. Patient states he has been stable on this dose for a "long time".   INR is therapeutic today at 2.04 but at lower end of range. CBC improved: Hgb 11.5, platelet count 283. No bleeding noted.    Mr. Stallbaumer is also on Plavix and aspirin.  Goal of Therapy:  INR 2-3 Monitor platelets by anticoagulation protocol: Yes   Plan:  - Coumadin 5 mg PO x 1 dose tonight - Daily INR - F/U s/s of bleeding  - At discharge, plan on sending home on 5 mg daily (home dose) and f/u INR in a week.  Roma Kayser, PharmD.  Clinical Pharmacist Pager [phone]

## 2013-05-10 ENCOUNTER — Ambulatory Visit: Payer: Medicare Other | Admitting: Vascular Surgery

## 2013-05-10 ENCOUNTER — Ambulatory Visit (INDEPENDENT_AMBULATORY_CARE_PROVIDER_SITE_OTHER): Payer: Medicare Other | Admitting: Family

## 2013-05-10 ENCOUNTER — Encounter (HOSPITAL_COMMUNITY): Payer: Medicare Other

## 2013-05-10 ENCOUNTER — Encounter: Payer: Self-pay | Admitting: Family

## 2013-05-10 VITALS — BP 130/78 | HR 91 | Temp 97.0°F | Resp 18 | Ht 69.0 in | Wt 189.9 lb

## 2013-05-10 DIAGNOSIS — I739 Peripheral vascular disease, unspecified: Secondary | ICD-10-CM

## 2013-05-10 DIAGNOSIS — Z48812 Encounter for surgical aftercare following surgery on the circulatory system: Secondary | ICD-10-CM | POA: Insufficient documentation

## 2013-05-10 NOTE — Progress Notes (Signed)
[facility] [place] HISTORY AND PHYSICAL -PAD  History of Present Illness JAVARIUS TSOSIE is a 63 y.o. male patient of Dr. Edilia Bo who he had seen on 03/08/2013 with a wound on his right lateral malleolus there was approximately 1 cm in diameter. Dr. Edilia Bo set him up for an arteriogram. However his arteriogram was canceled because he had a rapid heart rate and was admitted by cardiology. During his workup he developed an acutely ischemic right lower extremity. He was taken to the operating room by Dr. Myra Gianotti and underwent a right common femoral and superficial femoral artery endarterectomy with patch angioplasty and stenting of the right common iliac artery and right external iliac artery with an excellent result. He was discharged from the hospital on 03/25/2013.  He had an I&D of right groin, removal of prosthetic patch, vein patch angioplasty of CFA and SFA on 04/26/13 by Dr. Arbie Cookey 3 weeks ago Dr. Edilia Bo instructed his wife on how to do the dressing changes to his right groin. She was to continue the dressing changes on the ankle. Both of these wounds on the ankle gradually heal. If not we we need to consider an endovascular approach to his moderate superficial femoral artery stenosis. Fortunately he is not a smoker. He was scheduled to return on Monday with ABIs and toe pressure on the right, to see Dr. Myra Gianotti, got confused on the date, and returns 5 days earlier, not vascular studies done.  He denies claudication symptoms with walking, the 2 ulcers at right lateral malleolus are filling in, per wife, she is doing dressing changes every 3 days as instructed. Wife is dressing right groin wound with saf gel and dry rolled 4x4's, she is putting some saline onto the 4x4's to remove the dressing, I advised her not to place saline, that removal of dry dressing helps to debride. Wife thinks that the right groin wound is more pink, no purulent drainage, blood tinged dressing on  removal  Pt Diabetic: Yes, sounds like in fair control, states has improved Pt smoker: former smoker, quit in 1999  Pt meds include: Statin :No, weak muscles from Lipitor Betablocker: Yes ASA: Yes Other anticoagulants/antiplatelets: coumadin, Plavix  Past Medical History  Diagnosis Date  . Diabetes mellitus   . Hypertension   . Hyperlipidemia   . Rheumatoid arthritis   . CAD (coronary artery disease)   . Myocardial infarction 2000 & 2006  . GERD (gastroesophageal reflux disease)   . Leg pain   . CHF (congestive heart failure)   . Chronic kidney disease   . PAD (peripheral artery disease)   . Decubitus ulcer of right ankle   . 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 Jude - single chamber ICD  . Pacemaker     Social History History  Substance Use Topics  . Smoking status: Former Smoker    Types: Cigarettes    Quit date: 03/30/1997  . Smokeless tobacco: Not on file  . Alcohol Use: 0.6 oz/week    1 Glasses of wine per week    Family History Family History  Problem Relation Age of Onset  . Hyperlipidemia Mother   . Diabetes Father   . Hypertension Father   . Heart disease Father   . Heart attack Father     Past Surgical History  Procedure Laterality Date  . Hernia repair    . Coronary artery bypass graft    . Carpal tunnel release    . Knee surgery    .  Cardiac defibrillator placement  04/08/2012    St. Jude Device - single chamber ICD  . Difibulator    . Patch angioplasty Right 03/14/2013    Procedure: PATCH ANGIOPLASTY Right Common Femoral, Superficial Femoral and Profunda-Femoral;  Surgeon: Nada Libman, MD;  Location: [facility] OR;  Service: Vascular;  Laterality: Right;  . Abdominal aortagram N/A 03/14/2013    Procedure: ABDOMINAL AORTAGRAM with right leg runoff.  Catheter in aorta times one;  Surgeon: Nada Libman, MD;  Location: [facility] OR;  Service: Vascular;  Laterality: N/A;  . Insertion of iliac stent Right 03/14/2013     Procedure: INSERTION OF ILIAC STENT Right Common and External ;  Surgeon: Nada Libman, MD;  Location: [facility] OR;  Service: Vascular;  Laterality: Right;  . I&d extremity Right 04/26/2013    Procedure: IRRIGATION AND DEBRIDEMENT right groin, removal of prostectic patch;  Surgeon: Larina Earthly, MD;  Location: [facility] OR;  Service: Vascular;  Laterality: Right;  . Patch angioplasty Right 04/26/2013    Procedure: Vein PATCH ANGIOPLASTY of common femoral and SFA;  Surgeon: Larina Earthly, MD;  Location: [facility] OR;  Service: Vascular;  Laterality: Right;    Allergies  Allergen Reactions  . Lipitor [Atorvastatin] Other (See Comments)    Weak muscles  . Lodine [Etodolac] Nausea And Vomiting  . Methotrexate Derivatives Nausea And Vomiting  . Remicade [Infliximab] Other (See Comments)    Chills and shakes   . Ranitidine Hcl Nausea Only    Current Outpatient Prescriptions  Medication Sig Dispense Refill  . aspirin EC 81 MG tablet Take 81 mg by mouth daily.        . Calcium Carb-Cholecalciferol (CALCIUM 1000 + D PO) Take 1 tablet by mouth daily.        . carvedilol (COREG) 12.5 MG tablet Take 25 mg by mouth 2 (two) times daily with a meal.      . clopidogrel (PLAVIX) 75 MG tablet Take 75 mg by mouth daily.        . digoxin (LANOXIN) 0.125 MG tablet Take 1 tablet (0.125 mg total) by mouth daily.  30 tablet  0  . ezetimibe (ZETIA) 10 MG tablet Take 10 mg by mouth at bedtime.       . feeding supplement, GLUCERNA SHAKE, (GLUCERNA SHAKE) LIQD Take 237 mLs by mouth daily at 3 pm.  30 Can  0  . ferrous gluconate (FERGON) 325 MG tablet Take 325 mg by mouth daily with breakfast.        . fish oil-omega-3 fatty acids 1000 MG capsule Take 2 g by mouth daily.        . furosemide (LASIX) 40 MG tablet Take 1 tablet (40 mg total) by mouth daily.  30 tablet  0  . gabapentin (NEURONTIN) 300 MG capsule Take 1 capsule (300 mg total) by mouth 2 (two) times daily.  60 capsule  0  . glyBURIDE (DIABETA) 5 MG tablet Take 1 tablet  (5 mg total) by mouth 2 (two) times daily with a meal.  30 tablet  1  . isosorbide mononitrate (IMDUR) 60 MG 24 hr tablet Take 30 mg by mouth daily.       Marland Kitchen levofloxacin (LEVAQUIN) 500 MG tablet Take 1 tablet (500 mg total) by mouth daily.  10 tablet  0  . losartan (COZAAR) 25 MG tablet Take 1 tablet (25 mg total) by mouth daily.  30 tablet  0  . Magnesium 100 MG TABS Take 1 tablet by mouth daily.      Marland Kitchen  nitroGLYCERIN (NITROSTAT) 0.4 MG SL tablet Place 1 tablet (0.4 mg total) under the tongue every 5 (five) minutes x 3 doses as needed for chest pain.  10 tablet  0  . oxyCODONE-acetaminophen (PERCOCET/ROXICET) 5-325 MG per tablet Take 1-2 tablets by mouth every 4 (four) hours as needed for severe pain.  30 tablet  0  . pantoprazole (PROTONIX) 40 MG tablet Take 1 tablet (40 mg total) by mouth 2 (two) times daily.  60 tablet  0  . predniSONE (DELTASONE) 5 MG tablet Take 5 mg by mouth 2 (two) times daily.       . sitaGLIPtin (JANUVIA) 100 MG tablet Take 100 mg by mouth at bedtime.       Marland Kitchen warfarin (COUMADIN) 5 MG tablet Take 1 tablet (5 mg total) by mouth daily at 6 PM.  30 tablet  0   No current facility-administered medications for this visit.    ROS: See HPI for pertinent positives and negatives.   Physical Examination  Filed Vitals:   05/10/13 1328  BP: 130/78  Pulse: 91  Temp: 97 F (36.1 C)  Resp: 18   Filed Weights   05/10/13 1328  Weight: 189 lb 14.4 oz (86.138 kg)   Body mass index is 28.03 kg/(m^2).  General: A&O x 3, WDWN, male, with wife. Gait: limp, using walker Pulmonary: CTAB, without wheezes , rales or rhonchi. Cardiac: regular Rythm , without detected murmur.         Carotid Bruits Left Right   Negative Negative  Aorta is not palpable. Radial pulses: right is not palpable (wife states blood gas draw from right radial with recent hospitalization), right brachial is 2+ palpable, left radial is 1+.                         VASCULAR EXAM: Extremities without  ischemic changes  without Gangrene; with granulating right lateral ankle ulcers. Right groin open debridement site with is moist, viable red tissue, no necrotic tissue.                                                                                                          LE Pulses LEFT RIGHT       FEMORAL  not palpable  not palpable        POPLITEAL  not palpable   not palpable       POSTERIOR TIBIAL  not palpable   not palpable        DORSALIS PEDIS      ANTERIOR TIBIAL not palpable  not palpable    Abdomen: soft, NT, no masses. Skin: no rashes,  Ulcers as noted above, many purpuric areas on forearms (takes coumadin, ASA, Plavix). Musculoskeletal: no muscle wasting or atrophy.  Neurologic: A&O X 3; Appropriate Affect ; SENSATION: normal; MOTOR FUNCTION:  moving all extremities equally, motor strength 4/5 throughout. Speech is fluent/normal. CN 2-12 intact.  ASSESSMENT: SHONDELL BRACCI is a 63 y.o. male who is s/p right common femoral and superficial femoral artery endarterectomy with patch  angioplasty and stenting of the right common iliac artery and right external iliac artery on 03/14/2013, and  I&D of right groin, removal of prosthetic patch, vein patch angioplasty of CFA and SFA on 04/26/13 by Dr. Arbie Cookey. His right lateral malleolus ulcers are granulating with home dressing changes by wife, and his right groin debridement site has pink and red viable tissue with not purulent drainage, no necrotic tissue.  Dr. Edilia Bo spoke with patient and wife, examined right groin wound. His wounds are improving.  PLAN:  Stop the Saf gel to wound, start wet-dry dressings bid, wife already familiar with this. I discussed in depth with the patient the nature of atherosclerosis, and emphasized the importance of maximal medical management including strict control of blood pressure, blood glucose, and lipid levels, obtaining regular exercise, and continued cessation of smoking.  The patient is  aware that without maximal medical management the underlying atherosclerotic disease process will progress, limiting the benefit of any interventions.  Based on the patient's vascular studies and examination, pt will return to clinic in 1-2  weeks to see Dr. Edilia Bo for wounds check.  The patient was given information about PAD including signs, symptoms, treatment, what symptoms should prompt the patient to seek immediate medical care, and risk reduction measures to take.  Charisse March, RN, MSN, FNP-C [facility] [place] Phone: [phone]  Clinic MD: Edilia Bo  05/10/2013 1:26 PM

## 2013-05-10 NOTE — Patient Instructions (Signed)
Peripheral Vascular Disease Peripheral Vascular Disease (PVD), also called Peripheral Arterial Disease (PAD), is a circulation problem caused by cholesterol (atherosclerotic plaque) deposits in the arteries. PVD commonly occurs in the lower extremities (legs) but it can occur in other areas of the body, such as your arms. The cholesterol buildup in the arteries reduces blood flow which can cause pain and other serious problems. The presence of PVD can place a person at risk for Coronary Artery Disease (CAD).  CAUSES  Causes of PVD can be many. It is usually associated with more than one risk factor such as:   High Cholesterol.  Smoking.  Diabetes.  Lack of exercise or inactivity.  High blood pressure (hypertension).  Obesity.  Family history. SYMPTOMS   When the lower extremities are affected, patients with PVD may experience:  Leg pain with exertion or physical activity. This is called INTERMITTENT CLAUDICATION. This may present as cramping or numbness with physical activity. The location of the pain is associated with the level of blockage. For example, blockage at the abdominal level (distal abdominal aorta) may result in buttock or hip pain. Lower leg arterial blockage may result in calf pain.  As PVD becomes more severe, pain can develop with less physical activity.  In people with severe PVD, leg pain may occur at rest.  Other PVD signs and symptoms:  Leg numbness or weakness.  Coldness in the affected leg or foot, especially when compared to the other leg.  A change in leg color.  Patients with significant PVD are more prone to ulcers or sores on toes, feet or legs. These may take longer to heal or may reoccur. The ulcers or sores can become infected.  If signs and symptoms of PVD are ignored, gangrene may occur. This can result in the loss of toes or loss of an entire limb.  Not all leg pain is related to PVD. Other medical conditions can cause leg pain such  as:  Blood clots (embolism) or Deep Vein Thrombosis.  Inflammation of the blood vessels (vasculitis).  Spinal stenosis. DIAGNOSIS  Diagnosis of PVD can involve several different types of tests. These can include:  Pulse Volume Recording Method (PVR). This test is simple, painless and does not involve the use of X-rays. PVR involves measuring and comparing the blood pressure in the arms and legs. An ABI (Ankle-Brachial Index) is calculated. The normal ratio of blood pressures is 1. As this number becomes smaller, it indicates more severe disease.  < 0.95  indicates significant narrowing in one or more leg vessels.  <0.8 there will usually be pain in the foot, leg or buttock with exercise.  <0.4 will usually have pain in the legs at rest.  <0.25  usually indicates limb threatening PVD.  Doppler detection of pulses in the legs. This test is painless and checks to see if you have a pulses in your legs/feet.  A dye or contrast material (a substance that highlights the blood vessels so they show up on x-ray) may be given to help your caregiver better see the arteries for the following tests. The dye is eliminated from your body by the kidney's. Your caregiver may order blood work to check your kidney function and other laboratory values before the following tests are performed:  Magnetic Resonance Angiography (MRA). An MRA is a picture study of the blood vessels and arteries. The MRA machine uses a large magnet to produce images of the blood vessels.  Computed Tomography Angiography (CTA). A CTA is a   specialized x-ray that looks at how the blood flows in your blood vessels. An IV may be inserted into your arm so contrast dye can be injected.  Angiogram. Is a procedure that uses x-rays to look at your blood vessels. This procedure is minimally invasive, meaning a small incision (cut) is made in your groin. A small tube (catheter) is then inserted into the artery of your groin. The catheter is  guided to the blood vessel or artery your caregiver wants to examine. Contrast dye is injected into the catheter. X-rays are then taken of the blood vessel or artery. After the images are obtained, the catheter is taken out. TREATMENT  Treatment of PVD involves many interventions which may include:  Lifestyle changes:  Quitting smoking.  Exercise.  Following a low fat, low cholesterol diet.  Control of diabetes.  Foot care is very important to the PVD patient. Good foot care can help prevent infection.  Medication:  Cholesterol-lowering medicine.  Blood pressure medicine.  Anti-platelet drugs.  Certain medicines may reduce symptoms of Intermittent Claudication.  Interventional/Surgical options:  Angioplasty. An Angioplasty is a procedure that inflates a balloon in the blocked artery. This opens the blocked artery to improve blood flow.  Stent Implant. A wire mesh tube (stent) is placed in the artery. The stent expands and stays in place, allowing the artery to remain open.  Peripheral Bypass Surgery. This is a surgical procedure that reroutes the blood around a blocked artery to help improve blood flow. This type of procedure may be performed if Angioplasty or stent implants are not an option. SEEK IMMEDIATE MEDICAL CARE IF:   You develop pain or numbness in your arms or legs.  Your arm or leg turns cold, becomes blue in color.  You develop redness, warmth, swelling and pain in your arms or legs. MAKE SURE YOU:   Understand these instructions.  Will watch your condition.  Will get help right away if you are not doing well or get worse. Document Released: 04/23/2004 Document Revised: 06/08/2011 Document Reviewed: 03/20/2008 [employer] Patient Information 2014 [employer].  

## 2013-05-15 ENCOUNTER — Encounter: Payer: Self-pay | Admitting: Vascular Surgery

## 2013-05-15 ENCOUNTER — Encounter: Payer: Medicare Other | Admitting: Surgery

## 2013-05-16 ENCOUNTER — Encounter: Payer: Self-pay | Admitting: Vascular Surgery

## 2013-05-17 ENCOUNTER — Ambulatory Visit (INDEPENDENT_AMBULATORY_CARE_PROVIDER_SITE_OTHER): Payer: Medicare Other | Admitting: Vascular Surgery

## 2013-05-17 ENCOUNTER — Encounter: Payer: Self-pay | Admitting: Vascular Surgery

## 2013-05-17 VITALS — BP 150/73 | HR 81 | Temp 97.7°F | Ht 69.0 in | Wt 190.1 lb

## 2013-05-17 DIAGNOSIS — I739 Peripheral vascular disease, unspecified: Secondary | ICD-10-CM

## 2013-05-17 NOTE — Progress Notes (Signed)
   Patient name: [name] [name] MRN: [id number] DOB: 04-Oct-1950 Sex: male  REASON FOR VISIT: Follow up of right groin wound.  HPI: [name] [name] is a 63 y.o. male who was seen by Rosalita Chessman last week with a right groin wound. 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 Myra Gianotti. He subsequently developed a wound problem and Dr. Arbie Cookey debrided the wound and removed the bovine pericardial patch and replaced it with a vein patch using saphenous vein. He has had a large wound in the right groin which we have been following.  His wife has been doing the dressing changes twice a day. He denies fever or chills. He has no specific complaints.   REVIEW OF SYSTEMS: Arly.Keller ] denotes positive finding; [  ] denotes negative finding  CARDIOVASCULAR:  [ ]  chest pain   [ ]  dyspnea on exertion    CONSTITUTIONAL:  [ ]  fever   [ ]  chills  PHYSICAL EXAM: Filed Vitals:   05/17/13 1412  BP: 150/73  Pulse: 81  Temp: 97.7 F (36.5 C)  TempSrc: Oral  Height: 5\' 9"  (1.753 m)  Weight: 190 lb 1.6 oz (86.229 kg)  SpO2: 100%   Body mass index is 28.06 kg/(m^2). GENERAL: The patient is a well-nourished male, in no acute distress. The vital signs are documented above. CARDIOVASCULAR: There is a regular rate and rhythm. PULMONARY: There is good air exchange bilaterally without wheezing or rales. The wound is granulating nicely with only a small amount of exudate at the base of the wound.  MEDICAL ISSUES: We will continue dressing changes to the right groin until there is no further exudate and then we could switch to hydrogel for the dressing changes or even consider placing a negative pressure dressing. I plan on seeing him back in 2 weeks. They know to call sooner if he has problems.  Tierre Gerard S [facility]  [place]:  [phone]

## 2013-05-22 ENCOUNTER — Encounter: Payer: Medicare Other | Admitting: Surgery

## 2013-05-24 ENCOUNTER — Telehealth: Payer: Self-pay | Admitting: *Deleted

## 2013-05-24 ENCOUNTER — Ambulatory Visit (INDEPENDENT_AMBULATORY_CARE_PROVIDER_SITE_OTHER): Payer: Self-pay | Admitting: Vascular Surgery

## 2013-05-24 ENCOUNTER — Encounter: Payer: Self-pay | Admitting: Vascular Surgery

## 2013-05-24 VITALS — BP 105/70 | HR 89 | Temp 98.0°F | Resp 16 | Ht 68.5 in | Wt 188.0 lb

## 2013-05-24 DIAGNOSIS — T148XXA Other injury of unspecified body region, initial encounter: Secondary | ICD-10-CM | POA: Insufficient documentation

## 2013-05-24 DIAGNOSIS — L24A9 Irritant contact dermatitis due friction or contact with other specified body fluids: Secondary | ICD-10-CM | POA: Insufficient documentation

## 2013-05-24 NOTE — Telephone Encounter (Signed)
Responding to Pricilla Handler( pt.'s wife) telephone call regarding increased bloody drainage from right groin wound. She states that Mr. Warehime was seen by his PCP yesterday and that after riding in the car "blood was pooling in the incision." She states that today blood is continuing to ooze from the right groin incision.   She states that on Monday (05-22-2013) his Coumadin level was 2.7.  Mrs. Mian wants Dr. Edilia Bo to be aware of these changes.  Discussed pt.'s symptoms with Dr. Edilia Bo who recommended Mr. Finch come in and be seen for wound check. Appointment. made for 05-24-2013 (today) at 3:45 PM.  Mrs. Babino aware of appointment date and time and states she will bring him in.

## 2013-05-24 NOTE — Progress Notes (Signed)
   Patient name: [name] [name] MRN: [id number] DOB: 03-23-1951 Sex: male  REASON FOR VISIT: Oozing from right groin wound  HPI: [name] [name] is a 63 y.o. male who has a wound in the right groin where he had replacement of a bovine pericardial patch with a vein patch. His wife has been doing wet-to-dry dressing changes. He is on Coumadin. She was concerned that the edges of the wound were oozing and brings him in for a wound check. His last INR was 2.7.  REVIEW OF SYSTEMS: Arly.Keller ] denotes positive finding; [  ] denotes negative finding  CARDIOVASCULAR:  [ ]  chest pain   [ ]  dyspnea on exertion    CONSTITUTIONAL:  [ ]  fever   [ ]  chills  PHYSICAL EXAM: Filed Vitals:   05/24/13 1621  BP: 105/70  Pulse: 89  Temp: 98 F (36.7 C)  TempSrc: Oral  Resp: 16  Height: 5' 8.5" (1.74 m)  Weight: 188 lb (85.276 kg)  SpO2: 100%   Body mass index is 28.17 kg/(m^2). GENERAL: The patient is a well-nourished male, in no acute distress. The vital signs are documented above. CARDIOVASCULAR: There is a regular rate and rhythm. PULMONARY: There is good air exchange bilaterally without wheezing or rales. The wound looks very clean and they did some mild excisional debridement at the base of the wound. There is no exposed vein patch that I can see. At this point I think the wound is clean enough to use hydrogel which should cut back on the oozing from the wet-to-dry dressing.  MEDICAL ISSUES: I've instructed the wife to begin dressing changes with hydrogel which I think will keep the dressing from sticking to the wound and causing oozing since he is on Coumadin. He will keep his regularly scheduled follow up appointment.  Rosy Estabrook S [facility] [place] [facility]: [phone]

## 2013-05-30 ENCOUNTER — Encounter: Payer: Self-pay | Admitting: Vascular Surgery

## 2013-05-31 ENCOUNTER — Ambulatory Visit (INDEPENDENT_AMBULATORY_CARE_PROVIDER_SITE_OTHER): Payer: Self-pay | Admitting: Vascular Surgery

## 2013-05-31 ENCOUNTER — Encounter: Payer: Self-pay | Admitting: Vascular Surgery

## 2013-05-31 VITALS — BP 146/68 | HR 98 | Temp 97.8°F | Ht 68.5 in | Wt 193.4 lb

## 2013-05-31 DIAGNOSIS — I739 Peripheral vascular disease, unspecified: Secondary | ICD-10-CM

## 2013-05-31 DIAGNOSIS — T148XXA Other injury of unspecified body region, initial encounter: Secondary | ICD-10-CM

## 2013-05-31 DIAGNOSIS — L24A9 Irritant contact dermatitis due friction or contact with other specified body fluids: Secondary | ICD-10-CM

## 2013-05-31 NOTE — Progress Notes (Signed)
   Patient name: [name] [name] MRN: [id number] DOB: [birth year] Sex: male  REASON FOR VISIT: Follow up of right groin wound   HPI: [name] [name] is a 62 y.o. male who I last saw on 05/24/2013. He had a right common femoral artery endarterectomy with bovine pericardial patch angioplasty and extensive stenting of the iliac system on the right. 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 He is on chronic Coumadin therapy. He developed a large wound in the right groin which I have been following in the office.  Since I saw him last, he denies any specific complaints. His wife has been doing his dressing changes with hydrogel.  REVIEW OF SYSTEMS: Arly.Keller ] denotes positive finding; [  ] denotes negative finding  CARDIOVASCULAR:  [ ]  chest pain   [ ]  dyspnea on exertion    CONSTITUTIONAL:  [ ]  fever   [ ]  chills  PHYSICAL EXAM: Filed Vitals:   05/31/13 1401  BP: 146/68  Pulse: 98  Temp: 97.8 F (36.6 C)  TempSrc: Oral  Height: 5' 8.5" (1.74 m)  Weight: 193 lb 6.4 oz (87.726 kg)  SpO2: 100%   Body mass index is 28.98 kg/(m^2). GENERAL: The patient is a well-nourished male, in no acute distress. The vital signs are documented above. CARDIOVASCULAR: There is a regular rate and rhythm. PULMONARY: There is good air exchange bilaterally without wheezing or rales. The wound is granulating nicely. There is only a small amount of fibrin material at the base in the superior aspect.  MEDICAL ISSUES: His wound is healing nicely and will see him back in 3 weeks. His wife will continue his dressing changes. Of note the wound on his right lateral malleolus is healing well also.  Merin Borjon S [facility] [place] [facility]: [phone]

## 2013-06-20 ENCOUNTER — Encounter: Payer: Self-pay | Admitting: Vascular Surgery

## 2013-06-21 ENCOUNTER — Encounter: Payer: Self-pay | Admitting: Vascular Surgery

## 2013-06-21 ENCOUNTER — Ambulatory Visit (INDEPENDENT_AMBULATORY_CARE_PROVIDER_SITE_OTHER): Payer: Self-pay | Admitting: Vascular Surgery

## 2013-06-21 VITALS — BP 131/52 | HR 80 | Ht 68.5 in | Wt 193.6 lb

## 2013-06-21 DIAGNOSIS — I739 Peripheral vascular disease, unspecified: Secondary | ICD-10-CM

## 2013-06-21 NOTE — Progress Notes (Signed)
   Patient name: [name] [name] MRN: [id number] DOB: [birth year] Sex: male  REASON FOR VISIT: Three-week follow up visit  HPI: [name] [name] is a 63 y.o. male who had a right common femoral artery endarterectomy with bovine pericardial patch angioplasty and extensive stenting of the iliac system on the right by Dr. Myra Gianotti. 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 Arbie Cookey. He is on chronic Coumadin therapy.   He comes in for a wound check. His white continues to do his dressing changes with hydrogel.  REVIEW OF SYSTEMS: Arly.Keller ] denotes positive finding; [  ] denotes negative finding  CARDIOVASCULAR:  [ ]  chest pain   [ ]  dyspnea on exertion    CONSTITUTIONAL:  [ ]  fever   [ ]  chills  PHYSICAL EXAM: Filed Vitals:   06/21/13 1554  BP: 131/52  Pulse: 80  Height: 5' 8.5" (1.74 m)  Weight: 193 lb 9.6 oz (87.816 kg)  SpO2: 100%   Body mass index is 29.01 kg/(m^2). GENERAL: The patient is a well-nourished male, in no acute distress. The vital signs are documented above. CARDIOVASCULAR: There is a regular rate and rhythm. PULMONARY: There is good air exchange bilaterally without wheezing or rales. The right groin wound continues to contract and now only takes one 4 x 4. As a small amount of fibrin tissue at the superior aspect of the base of the wound but I did not think that it was safe to debride and therefore we'll continue with the dressing changes to  MEDICAL ISSUES: I will see him back in 2 months. His wife will continue dressing changes with hydrogel. The wound on the right lateral malleolus at this point has completely healed. He knows to call sooner if he has problems.  DICKSON,CHRISTOPHER S [facility] [place] [facility]: [phone]

## 2013-07-04 DIAGNOSIS — T827XXA Infection and inflammatory reaction due to other cardiac and vascular devices, implants and grafts, initial encounter: Secondary | ICD-10-CM

## 2013-08-22 ENCOUNTER — Encounter: Payer: Self-pay | Admitting: Vascular Surgery

## 2013-08-23 ENCOUNTER — Ambulatory Visit (INDEPENDENT_AMBULATORY_CARE_PROVIDER_SITE_OTHER): Payer: Medicare Other | Admitting: Vascular Surgery

## 2013-08-23 ENCOUNTER — Encounter: Payer: Self-pay | Admitting: Vascular Surgery

## 2013-08-23 VITALS — BP 159/73 | HR 79 | Temp 98.1°F | Ht 68.5 in | Wt 196.8 lb

## 2013-08-23 DIAGNOSIS — I739 Peripheral vascular disease, unspecified: Secondary | ICD-10-CM | POA: Insufficient documentation

## 2013-08-23 DIAGNOSIS — I7025 Atherosclerosis of native arteries of other extremities with ulceration: Secondary | ICD-10-CM | POA: Insufficient documentation

## 2013-08-23 DIAGNOSIS — L98499 Non-pressure chronic ulcer of skin of other sites with unspecified severity: Secondary | ICD-10-CM

## 2013-08-23 MED ORDER — CEPHALEXIN 500 MG PO CAPS: 500.0000 mg | ORAL_CAPSULE | Freq: Three times a day (TID) | ORAL | Status: DC

## 2013-08-23 NOTE — Assessment & Plan Note (Signed)
The wounds on the right foot have healed and the groin wound continues to improve nicely. However he has now developed a wound on the left great toe. He has a palpable left femoral pulse I think his previously stented external iliac artery is patent. He may have some and infrainguinal arterial occlusive disease, however, he has a fairly brisk Doppler signal in the left foot. I have written him a prescription for Keflex and he is scheduled to see Dr. Tanda Rockers in the wound care center tomorrow. As long as the wound is continuing to make progress having to hold off on arteriography. However if this does not improve fairly quickly I think we should proceed with arteriography to evaluate his iliac stents on the left and also potential infrainguinal arterial occlusive disease. I plan on seeing him back in one month and less I hear from the patient or from Dr. Tanda Rockers sooner.

## 2013-08-23 NOTE — Progress Notes (Signed)
[facility] [place]  Patient name: [name] [name] MRN: [id number] DOB: December 21, 1950 Sex: male  REASON FOR VISIT: Follow up of peripheral vascular disease.  HPI: [name] [name] is a 63 y.o. male who I last saw on 06/21/2013. He had a right common femoral artery endarterectomy with bovine pericardial patch angioplasty and extensive stenting of the iliac system on the right by Dr. Myra Gianotti. 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 Arbie Cookey. He is on chronic Coumadin therapy. At the time of his last visit, the wound in the right groin measured 4 cm x 4 cm. He comes in for a 2 month follow up visit.  Since I saw him last he has developed a wound on his left great toe. He had a blister there which opened and now has a wound on the toe. He scheduled to see Dr. Tanda Rockers at the wound care center tomorrow. I have reviewed his previous records. In 2011 he underwent left external iliac artery angioplasty and stenting. The wound on the left great toe developed within the last couple of weeks there  He denies fever or chills.  Past Medical History  Diagnosis Date  . Diabetes mellitus   . Hypertension   . Hyperlipidemia   . Rheumatoid arthritis   . CAD (coronary artery disease)   . Myocardial infarction 2000 & 2006  . GERD (gastroesophageal reflux disease)   . Leg pain   . CHF (congestive heart failure)   . Chronic kidney disease   . PAD (peripheral artery disease)   . Decubitus ulcer of right ankle   . 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 Jude - single chamber ICD  . Pacemaker    Family History  Problem Relation Age of Onset  . Hyperlipidemia Mother   . Diabetes Father   . Hypertension Father   . Heart disease Father   . Heart attack Father    SOCIAL HISTORY: History  Substance Use Topics  . Smoking status: Former Smoker    Types: Cigarettes    Quit  date: 03/30/1997  . Smokeless tobacco: Never Used  . Alcohol Use: 0.6 oz/week    1 Glasses of wine per week   Allergies  Allergen Reactions  . Lipitor [Atorvastatin] Other (See Comments)    Weak muscles  . Lodine [Etodolac] Nausea And Vomiting  . Methotrexate Derivatives Nausea And Vomiting  . Remicade [Infliximab] Other (See Comments)    Chills and shakes   . Ranitidine Hcl Nausea Only   Current Outpatient Prescriptions  Medication Sig Dispense Refill  . aspirin EC 81 MG tablet Take 81 mg by mouth daily.        . Calcium Carb-Cholecalciferol (CALCIUM 1000 + D PO) Take 1 tablet by mouth daily.        . carvedilol (COREG) 12.5 MG tablet Take 25 mg by mouth 2 (two) times daily with a meal.      . clopidogrel (PLAVIX) 75 MG tablet Take 75 mg by mouth daily.        . digoxin (LANOXIN) 0.125 MG tablet Take 1 tablet (0.125 mg total) by mouth daily.  30 tablet  0  . ezetimibe (ZETIA) 10 MG tablet Take 10 mg by mouth at bedtime.       . feeding supplement, GLUCERNA SHAKE, (GLUCERNA SHAKE) LIQD Take 237 mLs by mouth daily at 3 pm.  30 Can  0  .  ferrous gluconate (FERGON) 325 MG tablet Take 325 mg by mouth daily with breakfast.        . fish oil-omega-3 fatty acids 1000 MG capsule Take 2 g by mouth daily.        . furosemide (LASIX) 40 MG tablet Take 1 tablet (40 mg total) by mouth daily.  30 tablet  0  . gabapentin (NEURONTIN) 300 MG capsule Take 600 mg by mouth 3 (three) times daily.      Marland Kitchen glyBURIDE (DIABETA) 5 MG tablet Take 1 tablet (5 mg total) by mouth 2 (two) times daily with a meal.  30 tablet  1  . glyBURIDE-metformin (GLUCOVANCE) 5-500 MG per tablet Take 3 tablets by mouth daily with breakfast. 2 Tabs am and 1 Tab at bedtime      . Insulin Glargine (LANTUS SOLOSTAR) 100 UNIT/ML Solostar Pen Inject 10 Units into the skin daily.      . isosorbide mononitrate (IMDUR) 60 MG 24 hr tablet Take 30 mg by mouth daily.       Marland Kitchen levofloxacin (LEVAQUIN) 500 MG tablet Take 1 tablet (500 mg total)  by mouth daily.  10 tablet  0  . losartan (COZAAR) 25 MG tablet Take 1 tablet (25 mg total) by mouth daily.  30 tablet  0  . Magnesium 100 MG TABS Take 1 tablet by mouth daily. Takes one 400 mg tablet daily.      . nitroGLYCERIN (NITROSTAT) 0.4 MG SL tablet Place 1 tablet (0.4 mg total) under the tongue every 5 (five) minutes x 3 doses as needed for chest pain.  10 tablet  0  . oxyCODONE-acetaminophen (PERCOCET/ROXICET) 5-325 MG per tablet Take 1-2 tablets by mouth every 4 (four) hours as needed for severe pain.  30 tablet  0  . pantoprazole (PROTONIX) 40 MG tablet Take 1 tablet (40 mg total) by mouth 2 (two) times daily.  60 tablet  0  . predniSONE (DELTASONE) 5 MG tablet Take 5 mg by mouth 2 (two) times daily.       . sitaGLIPtin (JANUVIA) 100 MG tablet Take 100 mg by mouth at bedtime.       Marland Kitchen warfarin (COUMADIN) 5 MG tablet Take 4 mg by mouth daily at 6 PM.      . cephALEXin (KEFLEX) 500 MG capsule Take 1 capsule (500 mg total) by mouth 3 (three) times daily.  42 capsule  1   No current facility-administered medications for this visit.   REVIEW OF SYSTEMS: Arly.Keller ] denotes positive finding; [  ] denotes negative finding  CARDIOVASCULAR:  [ ]  chest pain   [ ]  chest pressure   [ ]  palpitations   [ ]  orthopnea   Arly.Keller ] dyspnea on exertion   Arly.Keller ] claudication   [ ]  rest pain   [ ]  DVT   [ ]  phlebitis PULMONARY:   [ ]  productive cough   [ ]  asthma   [ ]  wheezing NEUROLOGIC:   [ ]  weakness  [ ]  paresthesias  [ ]  aphasia  [ ]  amaurosis  [ ]  dizziness HEMATOLOGIC:   [ ]  bleeding problems   [ ]  clotting disorders MUSCULOSKELETAL:  [ ]  joint pain   [ ]  joint swelling [ ]  leg swelling GASTROINTESTINAL: [ ]   blood in stool  [ ]   hematemesis GENITOURINARY:  [ ]   dysuria  [ ]   hematuria PSYCHIATRIC:  [ ]  history of major depression INTEGUMENTARY:  [ ]  rashes  [ ]  ulcers CONSTITUTIONAL:  [ ]   fever   [ ]  chills  PHYSICAL EXAM: Filed Vitals:   08/23/13 1417  BP: 159/73  Pulse: 79  Temp: 98.1 F (36.7 C)    TempSrc: Oral  Height: 5' 8.5" (1.74 m)  Weight: 196 lb 12.8 oz (89.268 kg)  SpO2: 100%   Body mass index is 29.48 kg/(m^2). GENERAL: The patient is a well-nourished male, in no acute distress. The vital signs are documented above. CARDIOVASCULAR: There is a regular rate and rhythm. I am unable to assess his pulse in the right groin because of the wound and scar tissue. He does have a normal left femoral pulse. I cannot palpate pedal pulses. On the left side he has a brisk dorsalis pedis signal with the Doppler although this is monophasic. He also has a monophasic left posterior tibial signal. PULMONARY: There is good air exchange bilaterally without wheezing or rales. ABDOMEN: Soft and non-tender with normal pitched bowel sounds.  MUSCULOSKELETAL: There are no major deformities or cyanosis. NEUROLOGIC: No focal weakness or paresthesias are detected. SKIN: the wound in the right groin has improved significantly and now measures 3 cm x 0.5 cm. There is no significant drainage. On the left great toe he doesn't ulceration with surrounding cellulitis. PSYCHIATRIC: The patient has a normal affect.   MEDICAL ISSUES:  Peripheral vascular disease, unspecified The wounds on the right foot have healed and the groin wound continues to improve nicely. However he has now developed a wound on the left great toe. He has a palpable left femoral pulse I think his previously stented external iliac artery is patent. He may have some and infrainguinal arterial occlusive disease, however, he has a fairly brisk Doppler signal in the left foot. I have written him a prescription for Keflex and he is scheduled to see Dr. 08/25/13 in the wound care center tomorrow. As long as the wound is continuing to make progress having to hold off on arteriography. However if this does not improve fairly quickly I think we should proceed with arteriography to evaluate his iliac stents on the left and also potential infrainguinal  arterial occlusive disease. I plan on seeing him back in one month and less I hear from the patient or from Dr. Tanda Rockers sooner.    Return in about 4 weeks (around 09/20/2013).  09/22/2013 [facility] [place] [facility]: [phone]

## 2013-09-19 ENCOUNTER — Encounter: Payer: Self-pay | Admitting: Vascular Surgery

## 2013-09-20 ENCOUNTER — Encounter: Payer: Self-pay | Admitting: Vascular Surgery

## 2013-09-20 ENCOUNTER — Ambulatory Visit (INDEPENDENT_AMBULATORY_CARE_PROVIDER_SITE_OTHER): Payer: Medicare Other | Admitting: Vascular Surgery

## 2013-09-20 ENCOUNTER — Other Ambulatory Visit: Payer: Self-pay

## 2013-09-20 VITALS — BP 158/72 | HR 84 | Temp 97.8°F | Ht 68.5 in | Wt 194.0 lb

## 2013-09-20 DIAGNOSIS — L98499 Non-pressure chronic ulcer of skin of other sites with unspecified severity: Secondary | ICD-10-CM

## 2013-09-20 DIAGNOSIS — I739 Peripheral vascular disease, unspecified: Secondary | ICD-10-CM

## 2013-09-20 DIAGNOSIS — M79672 Pain in left foot: Secondary | ICD-10-CM

## 2013-09-20 DIAGNOSIS — M79609 Pain in unspecified limb: Secondary | ICD-10-CM

## 2013-09-20 NOTE — Progress Notes (Signed)
 [facility] [place]  Patient name: [name] [name] MRN: [id number] DOB: 06/20/1950 Sex: male  REASON FOR VISIT: Follow up of nonhealing wound of the left great toe.  HPI: [name] [name] is a 62 y.o. male who I last saw on 08/23/2013. At that point he developed a left great toe wound from a blister. He was being followed by Dr. Nichols at the wound care center. He has undergone previous left external iliac artery angioplasty and stenting in 2011.   In addition, he had a right common femoral artery endarterectomy with bovine pericardial patch angioplasty and extensive stenting of the iliac system on the right by Dr. Brabham. 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 Early.  He has been having some rest pain in the left foot and claudication symptoms on the left. He denies fever or chills.   Past Medical History  Diagnosis Date  . Diabetes mellitus   . Hypertension   . Hyperlipidemia   . Rheumatoid arthritis   . CAD (coronary artery disease)   . Myocardial infarction 2000 & 2006  . GERD (gastroesophageal reflux disease)   . Leg pain   . CHF (congestive heart failure)   . Chronic kidney disease   . PAD (peripheral artery disease)   . Decubitus ulcer of right ankle   . 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 Jude - single chamber ICD  . Pacemaker    Family History  Problem Relation Age of Onset  . Hyperlipidemia Mother   . Diabetes Father   . Hypertension Father   . Heart disease Father   . Heart attack Father    SOCIAL HISTORY: History  Substance Use Topics  . Smoking status: Former Smoker    Types: Cigarettes    Quit date: 03/30/1997  . Smokeless tobacco: Never Used  . Alcohol Use: 0.6 oz/week    1 Glasses of wine per week   Allergies  Allergen Reactions  . Lipitor [Atorvastatin] Other (See Comments)    Weak muscles  . Lodine  [Etodolac] Nausea And Vomiting  . Methotrexate Derivatives Nausea And Vomiting  . Remicade [Infliximab] Other (See Comments)    Chills and shakes   . Ranitidine Hcl Nausea Only   Current Outpatient Prescriptions  Medication Sig Dispense Refill  . aspirin EC 81 MG tablet Take 81 mg by mouth daily.        . Calcium Carb-Cholecalciferol (CALCIUM 1000 + D PO) Take 1 tablet by mouth daily.        . carvedilol (COREG) 12.5 MG tablet Take 25 mg by mouth 2 (two) times daily with a meal.      . cephALEXin (KEFLEX) 500 MG capsule Take 1 capsule (500 mg total) by mouth 3 (three) times daily.  42 capsule  1  . clopidogrel (PLAVIX) 75 MG tablet Take 75 mg by mouth daily.        . digoxin (LANOXIN) 0.125 MG tablet Take 1 tablet (0.125 mg total) by mouth daily.  30 tablet  0  . feeding supplement, GLUCERNA SHAKE, (GLUCERNA SHAKE) LIQD Take 237 mLs by mouth daily at 3 pm.  30 Can  0  . ferrous gluconate (FERGON) 325 MG tablet Take 325 mg by mouth daily with breakfast.        . fish oil-omega-3 fatty acids 1000 MG capsule Take 2 g by mouth daily.        .   furosemide (LASIX) 40 MG tablet Take 1 tablet (40 mg total) by mouth daily.  30 tablet  0  . gabapentin (NEURONTIN) 300 MG capsule Take 600 mg by mouth 3 (three) times daily.      Marland Kitchen glyBURIDE (DIABETA) 5 MG tablet Take 1 tablet (5 mg total) by mouth 2 (two) times daily with a meal.  30 tablet  1  . glyBURIDE-metformin (GLUCOVANCE) 5-500 MG per tablet Take 3 tablets by mouth daily with breakfast. 2 Tabs am and 1 Tab at bedtime      . HYDROcodone-acetaminophen (NORCO) 10-325 MG per tablet Take 1 tablet by mouth every 6 (six) hours as needed.      . Insulin Glargine (LANTUS SOLOSTAR) 100 UNIT/ML Solostar Pen Inject 10 Units into the skin daily.      Marland Kitchen levofloxacin (LEVAQUIN) 500 MG tablet Take 1 tablet (500 mg total) by mouth daily.  10 tablet  0  . losartan (COZAAR) 25 MG tablet Take 1 tablet (25 mg total) by mouth daily.  30 tablet  0  . Magnesium 100 MG  TABS Take 1 tablet by mouth daily. Takes one 400 mg tablet daily.      . nitroGLYCERIN (NITROSTAT) 0.4 MG SL tablet Place 1 tablet (0.4 mg total) under the tongue every 5 (five) minutes x 3 doses as needed for chest pain.  10 tablet  0  . pantoprazole (PROTONIX) 40 MG tablet Take 1 tablet (40 mg total) by mouth 2 (two) times daily.  60 tablet  0  . predniSONE (DELTASONE) 5 MG tablet Take 5 mg by mouth 2 (two) times daily.       . sitaGLIPtin (JANUVIA) 100 MG tablet Take 100 mg by mouth at bedtime.       Marland Kitchen warfarin (COUMADIN) 5 MG tablet Take 4 mg by mouth daily at 6 PM.      . ezetimibe (ZETIA) 10 MG tablet Take 10 mg by mouth at bedtime.       . isosorbide mononitrate (IMDUR) 60 MG 24 hr tablet Take 30 mg by mouth daily.       Marland Kitchen oxyCODONE-acetaminophen (PERCOCET/ROXICET) 5-325 MG per tablet Take 1-2 tablets by mouth every 4 (four) hours as needed for severe pain.  30 tablet  0   No current facility-administered medications for this visit.   REVIEW OF SYSTEMS: Arly.Keller ] denotes positive finding; [  ] denotes negative finding  CARDIOVASCULAR:  [ ]  chest pain   [ ]  chest pressure   [ ]  palpitations   [ ]  orthopnea   ] dyspnea on exertion   ] claudication   ] rest pain   [ ]  DVT   [ ]  phlebitis PULMONARY:   [ ]  productive cough   [ ]  asthma   [ ]  wheezing NEUROLOGIC:   [ ]  weakness  [ ]  paresthesias  [ ]  aphasia  [ ]  amaurosis  [ ]  dizziness HEMATOLOGIC:   [ ]  bleeding problems   [ ]  clotting disorders MUSCULOSKELETAL:  [ ]  joint pain   [ ]  joint swelling [ ]  leg swelling GASTROINTESTINAL: [ ]   blood in stool  [ ]   hematemesis GENITOURINARY:  [ ]   dysuria  [ ]   hematuria PSYCHIATRIC:  [ ]  history of major depression INTEGUMENTARY:  [ ]  rashes  [ ]  ulcers CONSTITUTIONAL:  [ ]  fever   [ ]  chills  PHYSICAL EXAM: Filed Vitals:   09/20/13 1058  BP: 158/72  Pulse: 84  Temp: 97.8  F (36.6 C)  TempSrc: Oral  Height: 5' 8.5" (1.74 m)  Weight: 194 lb (87.998 kg)  SpO2: 100%   Body mass  index is 29.07 kg/(m^2). GENERAL: The patient is a well-nourished male, in no acute distress. The vital signs are documented above. CARDIOVASCULAR: There is a regular rate and rhythm. He has a palpable left femoral pulse. I cannot palpate popliteal or pedal pulses on the left. The right foot is warm and well-perfused. He has chronic ischemia of the left foot. PULMONARY: There is good air exchange bilaterally without wheezing or rales. ABDOMEN: Soft and non-tender with normal pitched bowel sounds.  MUSCULOSKELETAL: There are no major deformities or cyanosis. NEUROLOGIC: No focal weakness or paresthesias are detected. SKIN: There is an open wound that is fairly deep on the left great toe. PSYCHIATRIC: The patient has a normal affect.  DATA: His most recent arterial study was in December of 2014. At that time he had triphasic flow throughout the iliac system on the left with a toe brachial index of 0.35. He has calcific vessels which were noncompressible and ABIs cannot be obtained.   MEDICAL ISSUES:  Atherosclerosis of native arteries of the extremities with ulceration(440.23) This patient has a nonhealing wound of his left great toe with evidence of infrainguinal arterial occlusive disease. I've recommended that we proceed with arteriography. I will need to stick the left groin given the wound in the right groin. I have reviewed with the patient the indications for arteriography. In addition, I have reviewed the potential complications of arteriography including but not limited to: Bleeding, arterial injury, arterial thrombosis, dye action, renal insufficiency, or other unpredictable medical problems. I have explained to the patient that if we find disease amenable to angioplasty we could potentially address this at the same time. I have discussed the potential complications of angioplasty and stenting, including but not limited to: Bleeding, arterial thrombosis, arterial injury, dissection, or the  need for surgical intervention. This has been scheduled for 09/25/2013. We will have him stop his Coumadin 5 days prior to his procedure. He tells me that he is no longer in A. Fib and was hoping to stop his Coumadin completely. We will try to check with his cardiologist concerning this issue. I will make further recommendations pending the results of his arteriogram.    DICKSON,CHRISTOPHER S [facility] [place] [facility]: [phone]

## 2013-09-20 NOTE — Assessment & Plan Note (Signed)
This patient has a nonhealing wound of his left great toe with evidence of infrainguinal arterial occlusive disease. I've recommended that we proceed with arteriography. I will need to stick the left groin given the wound in the right groin. I have reviewed with the patient the indications for arteriography. In addition, I have reviewed the potential complications of arteriography including but not limited to: Bleeding, arterial injury, arterial thrombosis, dye action, renal insufficiency, or other unpredictable medical problems. I have explained to the patient that if we find disease amenable to angioplasty we could potentially address this at the same time. I have discussed the potential complications of angioplasty and stenting, including but not limited to: Bleeding, arterial thrombosis, arterial injury, dissection, or the need for surgical intervention. This has been scheduled for 09/25/2013. We will have him stop his Coumadin 5 days prior to his procedure. He tells me that he is no longer in A. Fib and was hoping to stop his Coumadin completely. We will try to check with his cardiologist concerning this issue. I will make further recommendations pending the results of his arteriogram.

## 2013-09-21 ENCOUNTER — Encounter (HOSPITAL_COMMUNITY): Payer: Self-pay | Admitting: Pharmacy Technician

## 2013-09-25 ENCOUNTER — Other Ambulatory Visit: Payer: Self-pay

## 2013-09-25 ENCOUNTER — Encounter (HOSPITAL_COMMUNITY): Payer: Self-pay | Admitting: *Deleted

## 2013-09-25 ENCOUNTER — Ambulatory Visit (HOSPITAL_COMMUNITY): Payer: Medicare Other

## 2013-09-25 ENCOUNTER — Ambulatory Visit (HOSPITAL_COMMUNITY)
Admission: RE | Admit: 2013-09-25 | Discharge: 2013-09-25 | Disposition: A | Discharge status: Home / Self Care | Payer: Medicare Other | Source: Ambulatory Visit | Attending: Vascular Surgery | Admitting: Vascular Surgery

## 2013-09-25 ENCOUNTER — Encounter (HOSPITAL_COMMUNITY)
Admission: RE | Disposition: A | Discharge status: Home / Self Care | Payer: Self-pay | Source: Ambulatory Visit | Attending: Vascular Surgery

## 2013-09-25 ENCOUNTER — Encounter (HOSPITAL_COMMUNITY): Payer: Medicare Other

## 2013-09-25 ENCOUNTER — Other Ambulatory Visit (HOSPITAL_COMMUNITY): Payer: Self-pay | Admitting: *Deleted

## 2013-09-25 DIAGNOSIS — E785 Hyperlipidemia, unspecified: Secondary | ICD-10-CM | POA: Insufficient documentation

## 2013-09-25 DIAGNOSIS — I509 Heart failure, unspecified: Secondary | ICD-10-CM

## 2013-09-25 DIAGNOSIS — Z01812 Encounter for preprocedural laboratory examination: Secondary | ICD-10-CM

## 2013-09-25 DIAGNOSIS — K219 Gastro-esophageal reflux disease without esophagitis: Secondary | ICD-10-CM

## 2013-09-25 DIAGNOSIS — L97509 Non-pressure chronic ulcer of other part of unspecified foot with unspecified severity: Secondary | ICD-10-CM

## 2013-09-25 DIAGNOSIS — I2510 Atherosclerotic heart disease of native coronary artery without angina pectoris: Secondary | ICD-10-CM

## 2013-09-25 DIAGNOSIS — I252 Old myocardial infarction: Secondary | ICD-10-CM | POA: Insufficient documentation

## 2013-09-25 DIAGNOSIS — N189 Chronic kidney disease, unspecified: Secondary | ICD-10-CM | POA: Insufficient documentation

## 2013-09-25 DIAGNOSIS — Z7901 Long term (current) use of anticoagulants: Secondary | ICD-10-CM | POA: Insufficient documentation

## 2013-09-25 DIAGNOSIS — Z87891 Personal history of nicotine dependence: Secondary | ICD-10-CM

## 2013-09-25 DIAGNOSIS — E119 Type 2 diabetes mellitus without complications: Secondary | ICD-10-CM

## 2013-09-25 DIAGNOSIS — I129 Hypertensive chronic kidney disease with stage 1 through stage 4 chronic kidney disease, or unspecified chronic kidney disease: Secondary | ICD-10-CM

## 2013-09-25 DIAGNOSIS — Z95810 Presence of automatic (implantable) cardiac defibrillator: Secondary | ICD-10-CM | POA: Insufficient documentation

## 2013-09-25 DIAGNOSIS — I701 Atherosclerosis of renal artery: Secondary | ICD-10-CM | POA: Insufficient documentation

## 2013-09-25 DIAGNOSIS — Z7982 Long term (current) use of aspirin: Secondary | ICD-10-CM

## 2013-09-25 DIAGNOSIS — M069 Rheumatoid arthritis, unspecified: Secondary | ICD-10-CM

## 2013-09-25 DIAGNOSIS — I739 Peripheral vascular disease, unspecified: Secondary | ICD-10-CM

## 2013-09-25 DIAGNOSIS — L98499 Non-pressure chronic ulcer of skin of other sites with unspecified severity: Secondary | ICD-10-CM

## 2013-09-25 DIAGNOSIS — M79609 Pain in unspecified limb: Secondary | ICD-10-CM | POA: Diagnosis not present

## 2013-09-25 HISTORY — DX: Shortness of breath: R06.02

## 2013-09-25 HISTORY — PX: ABDOMINAL AORTAGRAM: SHX5454

## 2013-09-25 HISTORY — DX: Personal history of other medical treatment: Z92.89

## 2013-09-25 LAB — GLUCOSE, CAPILLARY
Glucose-Capillary: 323 mg/dL — ABNORMAL HIGH (ref 70–99)
Glucose-Capillary: 230 mg/dL — ABNORMAL HIGH (ref 70–99)
Glucose-Capillary: 239 mg/dL — ABNORMAL HIGH (ref 70–99)

## 2013-09-25 LAB — COMPREHENSIVE METABOLIC PANEL
ALBUMIN: 2.5 g/dL — AB (ref 3.5–5.2)
ALK PHOS: 78 U/L (ref 39–117)
ALT: 14 U/L (ref 0–53)
AST: 9 U/L (ref 0–37)
BUN: 8 mg/dL (ref 6–23)
CO2: 25 mEq/L (ref 19–32)
Calcium: 8.6 mg/dL (ref 8.4–10.5)
Chloride: 99 mEq/L (ref 96–112)
Creatinine, Ser: 0.59 mg/dL (ref 0.50–1.35)
GFR calc Af Amer: >90 mL/min (ref >90)
GFR calc non Af Amer: >90 mL/min (ref >90)
Glucose, Bld: 225 mg/dL — ABNORMAL HIGH (ref 70–99)
POTASSIUM: 3.2 meq/L — AB (ref 3.7–5.3)
Sodium: 137 mEq/L (ref 137–147)
TOTAL PROTEIN: 6.1 g/dL (ref 6.0–8.3)
Total Bilirubin: 0.2 mg/dL — ABNORMAL LOW (ref 0.3–1.2)

## 2013-09-25 LAB — POCT I-STAT, CHEM 8
BUN: 7 mg/dL (ref 6–23)
CALCIUM ION: 1.1 mmol/L — AB (ref 1.13–1.30)
CHLORIDE: 98 meq/L (ref 96–112)
CREATININE: 0.5 mg/dL (ref 0.50–1.35)
Glucose, Bld: 315 mg/dL — ABNORMAL HIGH (ref 70–99)
HCT: 32 % — ABNORMAL LOW (ref 39.0–52.0)
Hemoglobin: 10.9 g/dL — ABNORMAL LOW (ref 13.0–17.0)
Potassium: 3.7 mEq/L (ref 3.7–5.3)
Sodium: 138 mEq/L (ref 137–147)
TCO2: 24 mmol/L (ref 0–100)

## 2013-09-25 LAB — CBC
HCT: 28.7 % — ABNORMAL LOW (ref 39.0–52.0)
HEMOGLOBIN: 8.3 g/dL — AB (ref 13.0–17.0)
MCH: 21.7 pg — ABNORMAL LOW (ref 26.0–34.0)
MCHC: 28.9 g/dL — AB (ref 30.0–36.0)
MCV: 74.9 fL — ABNORMAL LOW (ref 78.0–100.0)
Platelets: 212 10*3/uL (ref 150–400)
RBC: 3.83 MIL/uL — ABNORMAL LOW (ref 4.22–5.81)
RDW: 20.2 % — AB (ref 11.5–15.5)
WBC: 6.4 10*3/uL (ref 4.0–10.5)

## 2013-09-25 LAB — TYPE AND SCREEN
ABO/RH(D): A POS
Antibody Screen: NEGATIVE

## 2013-09-25 LAB — SURGICAL PCR SCREEN
MRSA, PCR: NEGATIVE
Staphylococcus aureus: NEGATIVE

## 2013-09-25 LAB — PROTIME-INR
INR: 1.07 (ref 0.00–1.49)
Prothrombin Time: 13.9 seconds (ref 11.6–15.2)

## 2013-09-25 LAB — APTT: APTT: 26 s (ref 24–37)

## 2013-09-25 SURGERY — ABDOMINAL AORTAGRAM
Anesthesia: LOCAL

## 2013-09-25 MED ORDER — HYDROMORPHONE HCL PF 1 MG/ML IJ SOLN
INTRAMUSCULAR | Status: AC
  Administered 2013-09-25: 1 mg
  Filled 2013-09-25: qty 1

## 2013-09-25 MED ORDER — LIDOCAINE HCL (PF) 1 % IJ SOLN
INTRAMUSCULAR | Status: AC
  Filled 2013-09-25: qty 30

## 2013-09-25 MED ORDER — SODIUM CHLORIDE 0.9 % IV SOLN
1.0000 mL/kg/h | INTRAVENOUS | Status: DC
Start: 2013-09-25 — End: 2013-09-25

## 2013-09-25 MED ORDER — SODIUM CHLORIDE 0.9 % IV SOLN
INTRAVENOUS | Status: DC
  Administered 2013-09-25: 06:00:00 via INTRAVENOUS

## 2013-09-25 MED ORDER — LABETALOL HCL 5 MG/ML IV SOLN
INTRAVENOUS | Status: AC
  Filled 2013-09-25: qty 4

## 2013-09-25 MED ORDER — DEXTROSE 5 % IV SOLN
1.5000 g | INTRAVENOUS | Status: AC
  Administered 2013-09-26: 1.5 g via INTRAVENOUS

## 2013-09-25 MED ORDER — HYDRALAZINE HCL 20 MG/ML IJ SOLN: 10.0000 mg | INTRAMUSCULAR | Status: DC | PRN

## 2013-09-25 MED ORDER — FENTANYL CITRATE 0.05 MG/ML IJ SOLN
INTRAMUSCULAR | Status: AC
  Filled 2013-09-25: qty 2

## 2013-09-25 MED ORDER — HYDROMORPHONE HCL PF 2 MG/ML IJ SOLN: 1.0000 mg | INTRAMUSCULAR | Status: DC | PRN

## 2013-09-25 MED ORDER — MIDAZOLAM HCL 2 MG/2ML IJ SOLN
INTRAMUSCULAR | Status: AC
Start: 2013-09-25 — End: 2013-09-25
  Filled 2013-09-25: qty 2

## 2013-09-25 MED ORDER — INSULIN ASPART 100 UNIT/ML ~~LOC~~ SOLN
8.0000 [IU] | Freq: Once | SUBCUTANEOUS | Status: AC
  Administered 2013-09-25: 8 [IU] via SUBCUTANEOUS

## 2013-09-25 MED ORDER — HEPARIN (PORCINE) IN NACL 2-0.9 UNIT/ML-% IJ SOLN
INTRAMUSCULAR | Status: AC
  Filled 2013-09-25: qty 1000

## 2013-09-25 MED ORDER — SODIUM CHLORIDE 0.9 % IV SOLN: INTRAVENOUS | Status: DC

## 2013-09-25 NOTE — Progress Notes (Signed)
I called Hgb of 8.3 and Potassium of 3.2 to Sonya Rankin at Dr Adele Dan office.

## 2013-09-25 NOTE — Progress Notes (Signed)
Site area: left groin  Site Prior to Removal:  Level 0  Pressure Applied For 20 MINUTES    Minutes Beginning at 0835  Manual:   Yes.    Patient Status During Pull: stable  Post Pull Groin Site:  Level 0  Post Pull Instructions Given:  Yes.    Post Pull Pulses Present: pulses dopplered left DP Dressing  Applied:  Yes.    Comments:  Bedrest begins at 86 for 4 hours

## 2013-09-25 NOTE — H&P (View-Only) (Signed)
[facility] [place]  Patient name: [name] [name] MRN: [id number] DOB: November 08, 1950 Sex: male  REASON FOR VISIT: Follow up of nonhealing wound of the left great toe.  HPI: [name] [name] is a 63 y.o. male who I last saw on 08/23/2013. At that point he developed a left great toe wound from a blister. He was being followed by Dr. Tanda Rockers at the wound care center. He has undergone previous left external iliac artery angioplasty and stenting in 2011.   In addition, he had a right common femoral artery endarterectomy with bovine pericardial patch angioplasty and extensive stenting of the iliac system on the right by Dr. Myra Gianotti. 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 Arbie Cookey.  He has been having some rest pain in the left foot and claudication symptoms on the left. He denies fever or chills.   Past Medical History  Diagnosis Date  . Diabetes mellitus   . Hypertension   . Hyperlipidemia   . Rheumatoid arthritis   . CAD (coronary artery disease)   . Myocardial infarction 2000 & 2006  . GERD (gastroesophageal reflux disease)   . Leg pain   . CHF (congestive heart failure)   . Chronic kidney disease   . PAD (peripheral artery disease)   . Decubitus ulcer of right ankle   . 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 Jude - single chamber ICD  . Pacemaker    Family History  Problem Relation Age of Onset  . Hyperlipidemia Mother   . Diabetes Father   . Hypertension Father   . Heart disease Father   . Heart attack Father    SOCIAL HISTORY: History  Substance Use Topics  . Smoking status: Former Smoker    Types: Cigarettes    Quit date: 03/30/1997  . Smokeless tobacco: Never Used  . Alcohol Use: 0.6 oz/week    1 Glasses of wine per week   Allergies  Allergen Reactions  . Lipitor [Atorvastatin] Other (See Comments)    Weak muscles  . Lodine  [Etodolac] Nausea And Vomiting  . Methotrexate Derivatives Nausea And Vomiting  . Remicade [Infliximab] Other (See Comments)    Chills and shakes   . Ranitidine Hcl Nausea Only   Current Outpatient Prescriptions  Medication Sig Dispense Refill  . aspirin EC 81 MG tablet Take 81 mg by mouth daily.        . Calcium Carb-Cholecalciferol (CALCIUM 1000 + D PO) Take 1 tablet by mouth daily.        . carvedilol (COREG) 12.5 MG tablet Take 25 mg by mouth 2 (two) times daily with a meal.      . cephALEXin (KEFLEX) 500 MG capsule Take 1 capsule (500 mg total) by mouth 3 (three) times daily.  42 capsule  1  . clopidogrel (PLAVIX) 75 MG tablet Take 75 mg by mouth daily.        . digoxin (LANOXIN) 0.125 MG tablet Take 1 tablet (0.125 mg total) by mouth daily.  30 tablet  0  . feeding supplement, GLUCERNA SHAKE, (GLUCERNA SHAKE) LIQD Take 237 mLs by mouth daily at 3 pm.  30 Can  0  . ferrous gluconate (FERGON) 325 MG tablet Take 325 mg by mouth daily with breakfast.        . fish oil-omega-3 fatty acids 1000 MG capsule Take 2 g by mouth daily.        Marland Kitchen  furosemide (LASIX) 40 MG tablet Take 1 tablet (40 mg total) by mouth daily.  30 tablet  0  . gabapentin (NEURONTIN) 300 MG capsule Take 600 mg by mouth 3 (three) times daily.      Marland Kitchen glyBURIDE (DIABETA) 5 MG tablet Take 1 tablet (5 mg total) by mouth 2 (two) times daily with a meal.  30 tablet  1  . glyBURIDE-metformin (GLUCOVANCE) 5-500 MG per tablet Take 3 tablets by mouth daily with breakfast. 2 Tabs am and 1 Tab at bedtime      . HYDROcodone-acetaminophen (NORCO) 10-325 MG per tablet Take 1 tablet by mouth every 6 (six) hours as needed.      . Insulin Glargine (LANTUS SOLOSTAR) 100 UNIT/ML Solostar Pen Inject 10 Units into the skin daily.      Marland Kitchen levofloxacin (LEVAQUIN) 500 MG tablet Take 1 tablet (500 mg total) by mouth daily.  10 tablet  0  . losartan (COZAAR) 25 MG tablet Take 1 tablet (25 mg total) by mouth daily.  30 tablet  0  . Magnesium 100 MG  TABS Take 1 tablet by mouth daily. Takes one 400 mg tablet daily.      . nitroGLYCERIN (NITROSTAT) 0.4 MG SL tablet Place 1 tablet (0.4 mg total) under the tongue every 5 (five) minutes x 3 doses as needed for chest pain.  10 tablet  0  . pantoprazole (PROTONIX) 40 MG tablet Take 1 tablet (40 mg total) by mouth 2 (two) times daily.  60 tablet  0  . predniSONE (DELTASONE) 5 MG tablet Take 5 mg by mouth 2 (two) times daily.       . sitaGLIPtin (JANUVIA) 100 MG tablet Take 100 mg by mouth at bedtime.       Marland Kitchen warfarin (COUMADIN) 5 MG tablet Take 4 mg by mouth daily at 6 PM.      . ezetimibe (ZETIA) 10 MG tablet Take 10 mg by mouth at bedtime.       . isosorbide mononitrate (IMDUR) 60 MG 24 hr tablet Take 30 mg by mouth daily.       Marland Kitchen oxyCODONE-acetaminophen (PERCOCET/ROXICET) 5-325 MG per tablet Take 1-2 tablets by mouth every 4 (four) hours as needed for severe pain.  30 tablet  0   No current facility-administered medications for this visit.   REVIEW OF SYSTEMS: Arly.Keller ] denotes positive finding; [  ] denotes negative finding  CARDIOVASCULAR:  [ ]  chest pain   [ ]  chest pressure   [ ]  palpitations   [ ]  orthopnea   ] dyspnea on exertion   ] claudication   ] rest pain   [ ]  DVT   [ ]  phlebitis PULMONARY:   [ ]  productive cough   [ ]  asthma   [ ]  wheezing NEUROLOGIC:   [ ]  weakness  [ ]  paresthesias  [ ]  aphasia  [ ]  amaurosis  [ ]  dizziness HEMATOLOGIC:   [ ]  bleeding problems   [ ]  clotting disorders MUSCULOSKELETAL:  [ ]  joint pain   [ ]  joint swelling [ ]  leg swelling GASTROINTESTINAL: [ ]   blood in stool  [ ]   hematemesis GENITOURINARY:  [ ]   dysuria  [ ]   hematuria PSYCHIATRIC:  [ ]  history of major depression INTEGUMENTARY:  [ ]  rashes  [ ]  ulcers CONSTITUTIONAL:  [ ]  fever   [ ]  chills  PHYSICAL EXAM: Filed Vitals:   09/20/13 1058  BP: 158/72  Pulse: 84  Temp: 97.8  F (36.6 C)  TempSrc: Oral  Height: 5' 8.5" (1.74 m)  Weight: 194 lb (87.998 kg)  SpO2: 100%   Body mass  index is 29.07 kg/(m^2). GENERAL: The patient is a well-nourished male, in no acute distress. The vital signs are documented above. CARDIOVASCULAR: There is a regular rate and rhythm. He has a palpable left femoral pulse. I cannot palpate popliteal or pedal pulses on the left. The right foot is warm and well-perfused. He has chronic ischemia of the left foot. PULMONARY: There is good air exchange bilaterally without wheezing or rales. ABDOMEN: Soft and non-tender with normal pitched bowel sounds.  MUSCULOSKELETAL: There are no major deformities or cyanosis. NEUROLOGIC: No focal weakness or paresthesias are detected. SKIN: There is an open wound that is fairly deep on the left great toe. PSYCHIATRIC: The patient has a normal affect.  DATA: His most recent arterial study was in December of 2014. At that time he had triphasic flow throughout the iliac system on the left with a toe brachial index of 0.35. He has calcific vessels which were noncompressible and ABIs cannot be obtained.   MEDICAL ISSUES:  Atherosclerosis of native arteries of the extremities with ulceration(440.23) This patient has a nonhealing wound of his left great toe with evidence of infrainguinal arterial occlusive disease. I've recommended that we proceed with arteriography. I will need to stick the left groin given the wound in the right groin. I have reviewed with the patient the indications for arteriography. In addition, I have reviewed the potential complications of arteriography including but not limited to: Bleeding, arterial injury, arterial thrombosis, dye action, renal insufficiency, or other unpredictable medical problems. I have explained to the patient that if we find disease amenable to angioplasty we could potentially address this at the same time. I have discussed the potential complications of angioplasty and stenting, including but not limited to: Bleeding, arterial thrombosis, arterial injury, dissection, or the  need for surgical intervention. This has been scheduled for 09/25/2013. We will have him stop his Coumadin 5 days prior to his procedure. He tells me that he is no longer in A. Fib and was hoping to stop his Coumadin completely. We will try to check with his cardiologist concerning this issue. I will make further recommendations pending the results of his arteriogram.    DICKSON,CHRISTOPHER S [facility] [place] Beeper: [phone]

## 2013-09-25 NOTE — Progress Notes (Signed)
Report called to [facility]  in Short stay.  Pt to short stay room 2 via stretcher.

## 2013-09-25 NOTE — Pre-Procedure Instructions (Signed)
[name] [name]  09/25/2013   Your procedure is scheduled on : Tuesday, June 30.  Report to [facility] Admitting at 5:30AM.  Call this number if you have problems the morning of surgery: [phone]   Remember:   Do not eat food or drink liquids after midnight.   Take these medicines the morning of surgery with A SIP OF WATER: carvedilol (COREG), digoxin (LANOXIN), gabapentin (NEURONTIN), pantoprazole (PROTONIX),  Aspirin,predniSONE .                 Take if needed:nitroGLYCERIN (NITROSTAT), HYDROcodone-acetaminophen (NORCO).                   Do not take any medication for Diabetes the morning of surgery.         Do not wear jewelry, make-up or nail polish.  Do not wear lotions, powders, or perfumes.    Men may shave face and neck.  Do not bring valuables to the hospital.            [facility] is not responsible for any belongings or valuables.               Contacts, dentures or bridgework may not be worn into surgery.  Leave suitcase in the car. After surgery it may be brought to your room.  For patients admitted to the hospital, discharge time is determined by your   treatment team.               Patients discharged the day of surgery will not be allowed to drive home.  Name and phone number of your driver:-             Special Instructions: Review  [facility] - Preparing For Surgery.   Please read over the following fact sheets that you were given: Pain Booklet, Coughing and Deep Breathing, Blood Transfusion Information and Surgical Site Infection Prevention

## 2013-09-25 NOTE — Progress Notes (Signed)
09/25/13 1306  OBSTRUCTIVE SLEEP APNEA  Have you ever been diagnosed with sleep apnea through a sleep study? No  Do you snore loudly (loud enough to be heard through closed doors)?  1  Do you often feel tired, fatigued, or sleepy during the daytime? 1  Has anyone observed you stop breathing during your sleep? 0  Do you have, or are you being treated for high blood pressure? 1  BMI more than 35 kg/m2? 0  Age over 61 years old? 1  Neck circumference greater than 40 cm/16 inches? 1 (16.75)  Gender: 1  Obstructive Sleep Apnea Score 6  Score 4 or greater  Results sent to PCP

## 2013-09-25 NOTE — Interval H&P Note (Signed)
History and Physical Interval Note:  09/25/2013 7:24 AM  [name] [name]  has presented today for surgery, with the diagnosis of NonHealing wound of LEFT Great toe, PAD  The various methods of treatment have been discussed with the patient and family. After consideration of risks, benefits and other options for treatment, the patient has consented to  Procedure(s): ABDOMINAL AORTAGRAM (N/A) as a surgical intervention .  The patient's history has been reviewed, patient examined, no change in status, stable for surgery.  I have reviewed the patient's chart and labs.  Questions were answered to the patient's satisfaction.     DICKSON,CHRISTOPHER S

## 2013-09-25 NOTE — Op Note (Signed)
   PATIENT: [name] [name]  MRN: [id number] DOB: 25-Jul-1950    DATE OF PROCEDURE: 09/25/2013  INDICATIONS: ZYERE JIMINEZ is a 63 y.o. male who presents with a nonhealing wound of the left great toe. On exam he has evidence of infrainguinal arterial occlusive disease. He presents for arteriography.  PROCEDURE:  1. Ultrasound-guided access to the left common femoral artery 2. Aortogram with bilateral iliac arteriogram and bilateral lower extremity runoff  SURGEON: [name], MD, FACS  ANESTHESIA: local with sedation   EBL: minimal  TECHNIQUE: The patient was taken to the peripheral vascular lab and was sedated with 1 mg of Versed and 50 mcg of fentanyl. Both groins were prepped and draped in the usual sterile fashion. Under ultrasound guidance, after the skin was anesthetized, the left common femoral artery was cannulated with a micropuncture needle and a micropuncture sheath introduced over a wire. This was exchanged for a 5 French sheath over a versacore wire. The pigtail catheter was positioned at the L1 vertebral body flush aortogram obtained. The catheter was in position above the aortic bifurcation and oblique iliac projections were obtained. Next bilateral lower extremity runoff films were obtained. Next the pigtail catheter was exchanged for an endhole catheter and a pullback pressure was measured across the left iliac artery. There was no gradient at rest. The endhole catheter was then removed. The patient was transferred to the holding area for removal of the sheath.  FINDINGS:  1. There is a 60% right renal artery stenosis and 40% left renal artery stenosis. 2. There is diffuse calcific disease of the infrarenal aorta and iliac arteries. 3. The common iliac arteries, external iliac arteries, and hypogastric arteries are patent bilaterally. The previously placed bilateral iliac stents are patent. 4. On the left side, which is the symptomatic side, there is plaque  within the distal common femoral artery. The deep femoral artery is patent. There is moderate diffuse disease of the proximal superficial femoral artery. The superficial femoral artery is occluded in the mid thigh with reconstitution of the above-knee popliteal artery. The above-knee popliteal artery has moderate diffuse disease and a significant stenosis the level of the knee. The below knee popliteal artery is patent. There is three-vessel runoff on the left via the anterior tibial, posterior tibial, and peroneal arteries, although all 3 arteries have diffuse moderate disease. 5. On the right side, the common femoral, deep femoral, and proximal superficial femoral artery are patent. There is moderate disease on the right in the superficial femoral artery at the adductor canal. The popliteal artery is patent. There is three-vessel runoff on the right via the anterior tibial, posterior tibial, and peroneal arteries although there is mild diffuse disease.  CLINICAL NOTE: The patient is to be scheduled for a left femoral to below knee popliteal artery bypass.  Waverly Ferrari, MD, FACS [facility]  DATE OF DICTATION:   09/25/2013

## 2013-09-25 NOTE — Discharge Instructions (Signed)
NO METFORMIN FOR 2 DAYS ° ° ° °Arteriogram °Care After °These instructions give you information on caring for yourself after your procedure. Your doctor may also give you more specific instructions. Call your doctor if you have any problems or questions after your procedure. °HOME CARE °· Keep your leg straight for at least 6 hours. °· Do not bathe, swim, or use a hot tub until directed by your doctor. You can shower. °· Do not lift anything heavier than 10 pounds (about a gallon of milk) for 2 days. °· Do not walk a lot, run, or drive for 2 days. °· Return to normal activities in 2 days or as told by your doctor. °Finding out the results of your test °Ask when your test results will be ready. Make sure you get your test results. °GET HELP RIGHT AWAY IF:  °· You have fever. °· You have more pain in your leg. °· The leg that was cut is: °¨ Bleeding. °¨ Puffy (swollen) or red. °¨ Cold. °¨ Pale or changes color. °¨ Weak. °¨ Tingly or numb. °If you go to the Emergency Room, tell your nurse that you have had an arteriogram. Take this paper with you to show the nurse. °MAKE SURE YOU: °· Understand these instructions. °· Will watch your condition. °· Will get help right away if you are not doing well or get worse. °Document Released: 06/12/2008 Document Revised: 03/21/2013 Document Reviewed: 06/12/2008 °ExitCare® Patient Information ©2015 [employer]. This information is not intended to replace advice given to you by your health care provider. Make sure you discuss any questions you have with your health care provider. ° °

## 2013-09-26 ENCOUNTER — Ambulatory Visit (HOSPITAL_COMMUNITY): Payer: Medicare Other | Admitting: Certified Registered Nurse Anesthetist

## 2013-09-26 ENCOUNTER — Encounter (HOSPITAL_COMMUNITY)
Admission: RE | Disposition: A | Discharge status: Home Health | Payer: Self-pay | Source: Ambulatory Visit | Attending: Vascular Surgery

## 2013-09-26 ENCOUNTER — Encounter (HOSPITAL_COMMUNITY): Payer: Self-pay | Admitting: *Deleted

## 2013-09-26 ENCOUNTER — Encounter (HOSPITAL_COMMUNITY): Payer: Medicare Other | Admitting: Certified Registered Nurse Anesthetist

## 2013-09-26 ENCOUNTER — Inpatient Hospital Stay (HOSPITAL_COMMUNITY)
Admission: RE | Admit: 2013-09-26 | Discharge: 2013-09-28 | DRG: 253 | Disposition: A | Discharge status: Home Health | Payer: Medicare Other | Source: Ambulatory Visit | Attending: Vascular Surgery | Admitting: Vascular Surgery

## 2013-09-26 ENCOUNTER — Ambulatory Visit (HOSPITAL_COMMUNITY): Payer: Medicare Other

## 2013-09-26 DIAGNOSIS — Z79899 Other long term (current) drug therapy: Secondary | ICD-10-CM

## 2013-09-26 DIAGNOSIS — L98499 Non-pressure chronic ulcer of skin of other sites with unspecified severity: Principal | ICD-10-CM | POA: Diagnosis present

## 2013-09-26 DIAGNOSIS — I701 Atherosclerosis of renal artery: Secondary | ICD-10-CM | POA: Diagnosis present

## 2013-09-26 DIAGNOSIS — K219 Gastro-esophageal reflux disease without esophagitis: Secondary | ICD-10-CM | POA: Diagnosis present

## 2013-09-26 DIAGNOSIS — Z794 Long term (current) use of insulin: Secondary | ICD-10-CM

## 2013-09-26 DIAGNOSIS — I4891 Unspecified atrial fibrillation: Secondary | ICD-10-CM | POA: Diagnosis present

## 2013-09-26 DIAGNOSIS — I5022 Chronic systolic (congestive) heart failure: Secondary | ICD-10-CM | POA: Diagnosis present

## 2013-09-26 DIAGNOSIS — Z87891 Personal history of nicotine dependence: Secondary | ICD-10-CM

## 2013-09-26 DIAGNOSIS — N189 Chronic kidney disease, unspecified: Secondary | ICD-10-CM | POA: Diagnosis present

## 2013-09-26 DIAGNOSIS — L03039 Cellulitis of unspecified toe: Secondary | ICD-10-CM | POA: Diagnosis present

## 2013-09-26 DIAGNOSIS — IMO0002 Reserved for concepts with insufficient information to code with codable children: Secondary | ICD-10-CM | POA: Diagnosis not present

## 2013-09-26 DIAGNOSIS — Z01812 Encounter for preprocedural laboratory examination: Secondary | ICD-10-CM | POA: Diagnosis not present

## 2013-09-26 DIAGNOSIS — I4892 Unspecified atrial flutter: Secondary | ICD-10-CM | POA: Diagnosis not present

## 2013-09-26 DIAGNOSIS — Z95810 Presence of automatic (implantable) cardiac defibrillator: Secondary | ICD-10-CM

## 2013-09-26 DIAGNOSIS — Z7901 Long term (current) use of anticoagulants: Secondary | ICD-10-CM | POA: Diagnosis not present

## 2013-09-26 DIAGNOSIS — M069 Rheumatoid arthritis, unspecified: Secondary | ICD-10-CM | POA: Diagnosis present

## 2013-09-26 DIAGNOSIS — I509 Heart failure, unspecified: Secondary | ICD-10-CM | POA: Diagnosis present

## 2013-09-26 DIAGNOSIS — I739 Peripheral vascular disease, unspecified: Principal | ICD-10-CM | POA: Diagnosis present

## 2013-09-26 DIAGNOSIS — I2510 Atherosclerotic heart disease of native coronary artery without angina pectoris: Secondary | ICD-10-CM | POA: Diagnosis present

## 2013-09-26 DIAGNOSIS — I428 Other cardiomyopathies: Secondary | ICD-10-CM | POA: Diagnosis present

## 2013-09-26 DIAGNOSIS — M79609 Pain in unspecified limb: Secondary | ICD-10-CM | POA: Diagnosis present

## 2013-09-26 DIAGNOSIS — L97509 Non-pressure chronic ulcer of other part of unspecified foot with unspecified severity: Secondary | ICD-10-CM | POA: Diagnosis present

## 2013-09-26 DIAGNOSIS — E119 Type 2 diabetes mellitus without complications: Secondary | ICD-10-CM | POA: Diagnosis present

## 2013-09-26 DIAGNOSIS — I129 Hypertensive chronic kidney disease with stage 1 through stage 4 chronic kidney disease, or unspecified chronic kidney disease: Secondary | ICD-10-CM | POA: Diagnosis present

## 2013-09-26 DIAGNOSIS — Z951 Presence of aortocoronary bypass graft: Secondary | ICD-10-CM

## 2013-09-26 DIAGNOSIS — I252 Old myocardial infarction: Secondary | ICD-10-CM | POA: Diagnosis not present

## 2013-09-26 DIAGNOSIS — E785 Hyperlipidemia, unspecified: Secondary | ICD-10-CM | POA: Diagnosis present

## 2013-09-26 DIAGNOSIS — Z7982 Long term (current) use of aspirin: Secondary | ICD-10-CM

## 2013-09-26 DIAGNOSIS — L02619 Cutaneous abscess of unspecified foot: Secondary | ICD-10-CM | POA: Diagnosis present

## 2013-09-26 HISTORY — PX: INTRAOPERATIVE ARTERIOGRAM: SHX5157

## 2013-09-26 HISTORY — PX: FEMORAL-POPLITEAL BYPASS GRAFT: SHX937

## 2013-09-26 LAB — CBC
HEMATOCRIT: 25.9 % — AB (ref 39.0–52.0)
HEMOGLOBIN: 7.4 g/dL — AB (ref 13.0–17.0)
MCH: 21.6 pg — AB (ref 26.0–34.0)
MCHC: 28.6 g/dL — ABNORMAL LOW (ref 30.0–36.0)
MCV: 75.5 fL — AB (ref 78.0–100.0)
PLATELETS: 241 10*3/uL (ref 150–400)
RBC: 3.43 MIL/uL — AB (ref 4.22–5.81)
RDW: 20.1 % — ABNORMAL HIGH (ref 11.5–15.5)
WBC: 9.5 10*3/uL (ref 4.0–10.5)

## 2013-09-26 LAB — CREATININE, SERUM
Creatinine, Ser: 0.66 mg/dL (ref 0.50–1.35)
GFR calc non Af Amer: >90 mL/min (ref >90)

## 2013-09-26 LAB — GLUCOSE, CAPILLARY
GLUCOSE-CAPILLARY: 371 mg/dL — AB (ref 70–99)
Glucose-Capillary: 271 mg/dL — ABNORMAL HIGH (ref 70–99)
Glucose-Capillary: 247 mg/dL — ABNORMAL HIGH (ref 70–99)
Glucose-Capillary: 220 mg/dL — ABNORMAL HIGH (ref 70–99)
Glucose-Capillary: 183 mg/dL — ABNORMAL HIGH (ref 70–99)
Glucose-Capillary: 135 mg/dL — ABNORMAL HIGH (ref 70–99)
Glucose-Capillary: 316 mg/dL — ABNORMAL HIGH (ref 70–99)

## 2013-09-26 LAB — POCT I-STAT 4, (NA,K, GLUC, HGB,HCT)
Glucose, Bld: 299 mg/dL — ABNORMAL HIGH (ref 70–99)
HEMATOCRIT: 28 % — AB (ref 39.0–52.0)
HEMOGLOBIN: 9.5 g/dL — AB (ref 13.0–17.0)
POTASSIUM: 4 meq/L (ref 3.7–5.3)
SODIUM: 136 meq/L — AB (ref 137–147)

## 2013-09-26 SURGERY — BYPASS GRAFT FEMORAL-POPLITEAL ARTERY
Anesthesia: General | Site: Leg Lower | Laterality: Left

## 2013-09-26 MED ORDER — GLYBURIDE 5 MG PO TABS
5.0000 mg | ORAL_TABLET | Freq: Every day | ORAL | Status: DC
  Administered 2013-09-26 – 2013-09-27 (×2): 5 mg via ORAL
  Filled 2013-09-26 (×3): qty 1

## 2013-09-26 MED ORDER — NITROGLYCERIN 0.4 MG SL SUBL: 0.4000 mg | SUBLINGUAL_TABLET | SUBLINGUAL | Status: DC | PRN

## 2013-09-26 MED ORDER — GLYBURIDE 5 MG PO TABS
10.0000 mg | ORAL_TABLET | Freq: Every day | ORAL | Status: DC
  Administered 2013-09-27 – 2013-09-28 (×2): 10 mg via ORAL
  Filled 2013-09-26 (×3): qty 2

## 2013-09-26 MED ORDER — LABETALOL HCL 5 MG/ML IV SOLN
10.0000 mg | INTRAVENOUS | Status: DC | PRN
  Filled 2013-09-26: qty 4

## 2013-09-26 MED ORDER — SODIUM CHLORIDE 0.9 % IV SOLN
INTRAVENOUS | Status: DC
  Administered 2013-09-26: 15:00:00 via INTRAVENOUS

## 2013-09-26 MED ORDER — GLYCOPYRROLATE 0.2 MG/ML IJ SOLN
INTRAMUSCULAR | Status: AC
  Filled 2013-09-26: qty 4

## 2013-09-26 MED ORDER — ACETAMINOPHEN 325 MG PO TABS: 325.0000 mg | ORAL_TABLET | ORAL | Status: DC | PRN

## 2013-09-26 MED ORDER — GABAPENTIN 300 MG PO CAPS
600.0000 mg | ORAL_CAPSULE | Freq: Three times a day (TID) | ORAL | Status: DC
  Administered 2013-09-26 – 2013-09-28 (×6): 600 mg via ORAL
  Filled 2013-09-26 (×8): qty 2

## 2013-09-26 MED ORDER — ENOXAPARIN SODIUM 40 MG/0.4ML ~~LOC~~ SOLN
40.0000 mg | SUBCUTANEOUS | Status: DC
  Administered 2013-09-27: 40 mg via SUBCUTANEOUS
  Filled 2013-09-26 (×2): qty 0.4

## 2013-09-26 MED ORDER — NEOSTIGMINE METHYLSULFATE 10 MG/10ML IV SOLN
INTRAVENOUS | Status: DC | PRN
  Administered 2013-09-26: 5 mg via INTRAVENOUS

## 2013-09-26 MED ORDER — ONDANSETRON HCL 4 MG/2ML IJ SOLN: 4.0000 mg | Freq: Four times a day (QID) | INTRAMUSCULAR | Status: DC | PRN

## 2013-09-26 MED ORDER — FENTANYL CITRATE 0.05 MG/ML IJ SOLN
INTRAMUSCULAR | Status: AC
  Filled 2013-09-26: qty 5

## 2013-09-26 MED ORDER — ACETAMINOPHEN 650 MG RE SUPP: 325.0000 mg | RECTAL | Status: DC | PRN

## 2013-09-26 MED ORDER — ROCURONIUM BROMIDE 50 MG/5ML IV SOLN
INTRAVENOUS | Status: AC
  Filled 2013-09-26: qty 1

## 2013-09-26 MED ORDER — DEXTROSE 5 % IV SOLN
1.5000 g | Freq: Two times a day (BID) | INTRAVENOUS | Status: AC
  Administered 2013-09-26 – 2013-09-27 (×2): 1.5 g via INTRAVENOUS
  Filled 2013-09-26 (×2): qty 1.5

## 2013-09-26 MED ORDER — PREDNISONE 5 MG PO TABS
5.0000 mg | ORAL_TABLET | Freq: Three times a day (TID) | ORAL | Status: DC
  Administered 2013-09-26 – 2013-09-28 (×6): 5 mg via ORAL
  Filled 2013-09-26 (×8): qty 1

## 2013-09-26 MED ORDER — CHLORHEXIDINE GLUCONATE CLOTH 2 % EX PADS: 6.0000 | MEDICATED_PAD | Freq: Once | CUTANEOUS | Status: DC

## 2013-09-26 MED ORDER — MAGNESIUM SULFATE 40 MG/ML IJ SOLN
2.0000 g | Freq: Every day | INTRAMUSCULAR | Status: DC | PRN
  Filled 2013-09-26: qty 50

## 2013-09-26 MED ORDER — PHENOL 1.4 % MT LIQD: 1.0000 | OROMUCOSAL | Status: DC | PRN

## 2013-09-26 MED ORDER — DOCUSATE SODIUM 100 MG PO CAPS
100.0000 mg | ORAL_CAPSULE | Freq: Every day | ORAL | Status: DC
  Administered 2013-09-27 – 2013-09-28 (×2): 100 mg via ORAL
  Filled 2013-09-26 (×2): qty 1

## 2013-09-26 MED ORDER — OXYCODONE HCL 5 MG PO TABS: 5.0000 mg | ORAL_TABLET | Freq: Once | ORAL | Status: DC | PRN

## 2013-09-26 MED ORDER — SODIUM CHLORIDE 0.9 % IR SOLN
Status: DC | PRN
  Administered 2013-09-26: 07:00:00

## 2013-09-26 MED ORDER — LOSARTAN POTASSIUM 25 MG PO TABS
25.0000 mg | ORAL_TABLET | Freq: Every day | ORAL | Status: DC
  Administered 2013-09-28: 25 mg via ORAL
  Filled 2013-09-26 (×2): qty 1

## 2013-09-26 MED ORDER — LACTATED RINGERS IV SOLN
INTRAVENOUS | Status: DC | PRN
  Administered 2013-09-26 (×2): via INTRAVENOUS

## 2013-09-26 MED ORDER — LEFLUNOMIDE 20 MG PO TABS
20.0000 mg | ORAL_TABLET | Freq: Every day | ORAL | Status: DC
  Administered 2013-09-26 – 2013-09-28 (×3): 20 mg via ORAL
  Filled 2013-09-26 (×3): qty 1

## 2013-09-26 MED ORDER — LINAGLIPTIN 5 MG PO TABS
5.0000 mg | ORAL_TABLET | Freq: Every day | ORAL | Status: DC
  Administered 2013-09-27 – 2013-09-28 (×2): 5 mg via ORAL
  Filled 2013-09-26 (×2): qty 1

## 2013-09-26 MED ORDER — ALBUMIN HUMAN 5 % IV SOLN
INTRAVENOUS | Status: DC | PRN
  Administered 2013-09-26: 10:00:00 via INTRAVENOUS

## 2013-09-26 MED ORDER — FERROUS GLUCONATE 324 (38 FE) MG PO TABS
325.0000 mg | ORAL_TABLET | Freq: Every day | ORAL | Status: DC
  Administered 2013-09-27: 324 mg via ORAL
  Administered 2013-09-28: 325 mg via ORAL
  Filled 2013-09-26 (×3): qty 1

## 2013-09-26 MED ORDER — SODIUM CHLORIDE 0.9 % IV SOLN
INTRAVENOUS | Status: DC
  Filled 2013-09-26: qty 1

## 2013-09-26 MED ORDER — THROMBIN 20000 UNITS EX SOLR
CUTANEOUS | Status: AC
  Filled 2013-09-26: qty 20000

## 2013-09-26 MED ORDER — DOPAMINE-DEXTROSE 3.2-5 MG/ML-% IV SOLN: 3.0000 ug/kg/min | INTRAVENOUS | Status: DC

## 2013-09-26 MED ORDER — ONDANSETRON HCL 4 MG/2ML IJ SOLN
INTRAMUSCULAR | Status: AC
  Filled 2013-09-26: qty 2

## 2013-09-26 MED ORDER — OXYCODONE HCL 5 MG/5ML PO SOLN: 5.0000 mg | Freq: Once | ORAL | Status: DC | PRN

## 2013-09-26 MED ORDER — MAGNESIUM OXIDE 400 (241.3 MG) MG PO TABS
400.0000 mg | ORAL_TABLET | Freq: Every day | ORAL | Status: DC
  Administered 2013-09-26 – 2013-09-28 (×3): 400 mg via ORAL
  Filled 2013-09-26 (×3): qty 1

## 2013-09-26 MED ORDER — OXYCODONE-ACETAMINOPHEN 5-325 MG PO TABS
1.0000 | ORAL_TABLET | ORAL | Status: DC | PRN
  Administered 2013-09-26 – 2013-09-28 (×8): 2 via ORAL
  Filled 2013-09-26 (×8): qty 2

## 2013-09-26 MED ORDER — LIDOCAINE HCL (CARDIAC) 20 MG/ML IV SOLN
INTRAVENOUS | Status: AC
  Filled 2013-09-26: qty 5

## 2013-09-26 MED ORDER — HYDRALAZINE HCL 20 MG/ML IJ SOLN: 10.0000 mg | INTRAMUSCULAR | Status: DC | PRN

## 2013-09-26 MED ORDER — POTASSIUM CHLORIDE CRYS ER 20 MEQ PO TBCR
20.0000 meq | EXTENDED_RELEASE_TABLET | Freq: Every day | ORAL | Status: DC
  Administered 2013-09-27 – 2013-09-28 (×2): 20 meq via ORAL
  Filled 2013-09-26 (×3): qty 1

## 2013-09-26 MED ORDER — ROCURONIUM BROMIDE 100 MG/10ML IV SOLN
INTRAVENOUS | Status: DC | PRN
  Administered 2013-09-26: 20 mg via INTRAVENOUS
  Administered 2013-09-26: 50 mg via INTRAVENOUS
  Administered 2013-09-26: 30 mg via INTRAVENOUS

## 2013-09-26 MED ORDER — METFORMIN HCL 500 MG PO TABS
500.0000 mg | ORAL_TABLET | Freq: Every day | ORAL | Status: DC
  Administered 2013-09-26 – 2013-09-27 (×2): 500 mg via ORAL
  Filled 2013-09-26 (×3): qty 1

## 2013-09-26 MED ORDER — PANTOPRAZOLE SODIUM 40 MG PO TBEC
40.0000 mg | DELAYED_RELEASE_TABLET | Freq: Two times a day (BID) | ORAL | Status: DC
  Administered 2013-09-26 – 2013-09-28 (×4): 40 mg via ORAL
  Filled 2013-09-26 (×3): qty 1

## 2013-09-26 MED ORDER — INSULIN GLARGINE 100 UNIT/ML ~~LOC~~ SOLN
10.0000 [IU] | Freq: Every day | SUBCUTANEOUS | Status: DC
  Administered 2013-09-26 – 2013-09-27 (×2): 10 [IU] via SUBCUTANEOUS
  Filled 2013-09-26 (×3): qty 0.1

## 2013-09-26 MED ORDER — POTASSIUM CHLORIDE CRYS ER 20 MEQ PO TBCR: 20.0000 meq | EXTENDED_RELEASE_TABLET | Freq: Every day | ORAL | Status: DC | PRN

## 2013-09-26 MED ORDER — PROPOFOL 10 MG/ML IV BOLUS
INTRAVENOUS | Status: AC
  Filled 2013-09-26: qty 20

## 2013-09-26 MED ORDER — SODIUM CHLORIDE 0.9 % IV SOLN
100.0000 [IU] | INTRAVENOUS | Status: DC | PRN
  Administered 2013-09-26: 6.3 [IU]/h via INTRAVENOUS

## 2013-09-26 MED ORDER — DEXTROSE 5 % IV SOLN
INTRAVENOUS | Status: AC
  Filled 2013-09-26: qty 1.5

## 2013-09-26 MED ORDER — SODIUM CHLORIDE 0.9 % IV SOLN: 500.0000 mL | Freq: Once | INTRAVENOUS | Status: AC | PRN

## 2013-09-26 MED ORDER — PAPAVERINE HCL 30 MG/ML IJ SOLN
INTRAMUSCULAR | Status: AC
  Filled 2013-09-26: qty 2

## 2013-09-26 MED ORDER — BISACODYL 10 MG RE SUPP: 10.0000 mg | Freq: Every day | RECTAL | Status: DC | PRN

## 2013-09-26 MED ORDER — GLYBURIDE-METFORMIN 5-500 MG PO TABS: 1.0000 | ORAL_TABLET | Freq: Every day | ORAL | Status: DC

## 2013-09-26 MED ORDER — CEPHALEXIN 500 MG PO CAPS: 500.0000 mg | ORAL_CAPSULE | Freq: Three times a day (TID) | ORAL | Status: DC

## 2013-09-26 MED ORDER — LIDOCAINE HCL (CARDIAC) 20 MG/ML IV SOLN
INTRAVENOUS | Status: DC | PRN
  Administered 2013-09-26: 80 mg via INTRAVENOUS

## 2013-09-26 MED ORDER — PHENYLEPHRINE HCL 10 MG/ML IJ SOLN
10.0000 mg | INTRAVENOUS | Status: DC | PRN
  Administered 2013-09-26: 15 ug/min via INTRAVENOUS

## 2013-09-26 MED ORDER — GLYCOPYRROLATE 0.2 MG/ML IJ SOLN
INTRAMUSCULAR | Status: DC | PRN
  Administered 2013-09-26: .8 mg via INTRAVENOUS

## 2013-09-26 MED ORDER — WHITE PETROLATUM GEL
Status: AC
  Administered 2013-09-26: 0.2
  Filled 2013-09-26: qty 5

## 2013-09-26 MED ORDER — HYDROCORTISONE NA SUCCINATE PF 1000 MG IJ SOLR
INTRAMUSCULAR | Status: DC | PRN
  Administered 2013-09-26: 100 mg via INTRAVENOUS

## 2013-09-26 MED ORDER — HEPARIN SODIUM (PORCINE) 1000 UNIT/ML IJ SOLN
INTRAMUSCULAR | Status: DC | PRN
  Administered 2013-09-26: 8000 [IU] via INTRAVENOUS

## 2013-09-26 MED ORDER — ALUM & MAG HYDROXIDE-SIMETH 200-200-20 MG/5ML PO SUSP: 15.0000 mL | ORAL | Status: DC | PRN

## 2013-09-26 MED ORDER — ONDANSETRON HCL 4 MG/2ML IJ SOLN
INTRAMUSCULAR | Status: DC | PRN
  Administered 2013-09-26: 4 mg via INTRAVENOUS

## 2013-09-26 MED ORDER — CARVEDILOL 25 MG PO TABS
25.0000 mg | ORAL_TABLET | Freq: Two times a day (BID) | ORAL | Status: DC
  Administered 2013-09-26 – 2013-09-28 (×4): 25 mg via ORAL
  Filled 2013-09-26 (×6): qty 1

## 2013-09-26 MED ORDER — MORPHINE SULFATE 2 MG/ML IJ SOLN
2.0000 mg | INTRAMUSCULAR | Status: DC | PRN
  Administered 2013-09-27: 2 mg via INTRAVENOUS
  Filled 2013-09-26: qty 1

## 2013-09-26 MED ORDER — GUAIFENESIN-DM 100-10 MG/5ML PO SYRP: 15.0000 mL | ORAL_SOLUTION | ORAL | Status: DC | PRN

## 2013-09-26 MED ORDER — IOHEXOL 300 MG/ML  SOLN
INTRAMUSCULAR | Status: DC | PRN
  Administered 2013-09-26: 50 mL via INTRAVENOUS

## 2013-09-26 MED ORDER — CLOPIDOGREL BISULFATE 75 MG PO TABS
75.0000 mg | ORAL_TABLET | Freq: Every day | ORAL | Status: DC
  Administered 2013-09-26 – 2013-09-28 (×3): 75 mg via ORAL
  Filled 2013-09-26 (×3): qty 1

## 2013-09-26 MED ORDER — ASPIRIN EC 81 MG PO TBEC
81.0000 mg | DELAYED_RELEASE_TABLET | Freq: Every day | ORAL | Status: DC
  Administered 2013-09-27 – 2013-09-28 (×2): 81 mg via ORAL
  Filled 2013-09-26 (×2): qty 1

## 2013-09-26 MED ORDER — VECURONIUM BROMIDE 10 MG IV SOLR
INTRAVENOUS | Status: DC | PRN
  Administered 2013-09-26 (×2): 1 mg via INTRAVENOUS

## 2013-09-26 MED ORDER — MIDAZOLAM HCL 5 MG/5ML IJ SOLN
INTRAMUSCULAR | Status: DC | PRN
  Administered 2013-09-26 (×2): 1 mg via INTRAVENOUS

## 2013-09-26 MED ORDER — PROPOFOL 10 MG/ML IV BOLUS
INTRAVENOUS | Status: DC | PRN
  Administered 2013-09-26: 180 mg via INTRAVENOUS

## 2013-09-26 MED ORDER — MIDAZOLAM HCL 2 MG/2ML IJ SOLN
INTRAMUSCULAR | Status: AC
  Filled 2013-09-26: qty 2

## 2013-09-26 MED ORDER — SENNOSIDES-DOCUSATE SODIUM 8.6-50 MG PO TABS
1.0000 | ORAL_TABLET | Freq: Every evening | ORAL | Status: DC | PRN
  Filled 2013-09-26: qty 1

## 2013-09-26 MED ORDER — LEVOFLOXACIN 500 MG PO TABS: 500.0000 mg | ORAL_TABLET | Freq: Every day | ORAL | Status: DC

## 2013-09-26 MED ORDER — 0.9 % SODIUM CHLORIDE (POUR BTL) OPTIME
TOPICAL | Status: DC | PRN
  Administered 2013-09-26: 3000 mL

## 2013-09-26 MED ORDER — FENTANYL CITRATE 0.05 MG/ML IJ SOLN
INTRAMUSCULAR | Status: DC | PRN
  Administered 2013-09-26 (×2): 25 ug via INTRAVENOUS
  Administered 2013-09-26 (×3): 50 ug via INTRAVENOUS

## 2013-09-26 MED ORDER — DIGOXIN 125 MCG PO TABS
0.1250 mg | ORAL_TABLET | Freq: Every day | ORAL | Status: DC
  Administered 2013-09-27 – 2013-09-28 (×2): 0.125 mg via ORAL
  Filled 2013-09-26 (×2): qty 1

## 2013-09-26 MED ORDER — HYDROMORPHONE HCL PF 1 MG/ML IJ SOLN
INTRAMUSCULAR | Status: AC
  Filled 2013-09-26: qty 1

## 2013-09-26 MED ORDER — PROMETHAZINE HCL 25 MG/ML IJ SOLN: 6.2500 mg | INTRAMUSCULAR | Status: DC | PRN

## 2013-09-26 MED ORDER — MORPHINE SULFATE 2 MG/ML IJ SOLN
INTRAMUSCULAR | Status: AC
  Filled 2013-09-26: qty 1

## 2013-09-26 MED ORDER — METOPROLOL TARTRATE 1 MG/ML IV SOLN: 2.0000 mg | INTRAVENOUS | Status: DC | PRN

## 2013-09-26 MED ORDER — HYDROCORTISONE NA SUCCINATE PF 250 MG IJ SOLR
INTRAMUSCULAR | Status: AC
  Filled 2013-09-26: qty 500

## 2013-09-26 MED ORDER — METFORMIN HCL 500 MG PO TABS
1000.0000 mg | ORAL_TABLET | Freq: Every day | ORAL | Status: DC
  Administered 2013-09-27 – 2013-09-28 (×2): 1000 mg via ORAL
  Filled 2013-09-26 (×3): qty 2

## 2013-09-26 MED ORDER — PHENYLEPHRINE HCL 10 MG/ML IJ SOLN
INTRAMUSCULAR | Status: DC | PRN
  Administered 2013-09-26 (×2): 40 ug via INTRAVENOUS

## 2013-09-26 MED ORDER — FUROSEMIDE 40 MG PO TABS
40.0000 mg | ORAL_TABLET | Freq: Every day | ORAL | Status: DC
  Administered 2013-09-27 – 2013-09-28 (×2): 40 mg via ORAL
  Filled 2013-09-26 (×3): qty 1

## 2013-09-26 MED ORDER — HYDROMORPHONE HCL PF 1 MG/ML IJ SOLN
0.2500 mg | INTRAMUSCULAR | Status: DC | PRN
  Administered 2013-09-26 (×3): 0.5 mg via INTRAVENOUS

## 2013-09-26 MED ORDER — NEOSTIGMINE METHYLSULFATE 10 MG/10ML IV SOLN
INTRAVENOUS | Status: AC
  Filled 2013-09-26: qty 1

## 2013-09-26 SURGICAL SUPPLY — 75 items
ADH SKN CLS APL DERMABOND .7 (GAUZE/BANDAGES/DRESSINGS) ×6
BAG ISL DRAPE 18X18 STRL (DRAPES) ×3
BAG ISOLATION DRAPE 18X18 (DRAPES) ×1 IMPLANT
BANDAGE ELASTIC 4 VELCRO ST LF (GAUZE/BANDAGES/DRESSINGS) ×4 IMPLANT
BANDAGE ESMARK 6X9 LF (GAUZE/BANDAGES/DRESSINGS) ×1 IMPLANT
BLADE 10 SAFETY STRL DISP (BLADE) ×2 IMPLANT
BNDG CMPR 9X6 STRL LF SNTH (GAUZE/BANDAGES/DRESSINGS) ×3
BNDG ESMARK 6X9 LF (GAUZE/BANDAGES/DRESSINGS) ×5
CANISTER SUCTION 2500CC (MISCELLANEOUS) ×5 IMPLANT
CANNULA VESSEL 3MM 2 BLNT TIP (CANNULA) ×5 IMPLANT
CATH EMB 4FR 40CM (CATHETERS) ×6 IMPLANT
CATH EMB 4FR 80CM (CATHETERS) ×4 IMPLANT
CLIP TI MEDIUM 24 (CLIP) ×5 IMPLANT
CLIP TI WIDE RED SMALL 24 (CLIP) ×5 IMPLANT
COVER SURGICAL LIGHT HANDLE (MISCELLANEOUS) ×5 IMPLANT
CUFF TOURNIQUET SINGLE 24IN (TOURNIQUET CUFF) ×3 IMPLANT
CUFF TOURNIQUET SINGLE 34IN LL (TOURNIQUET CUFF) IMPLANT
CUFF TOURNIQUET SINGLE 44IN (TOURNIQUET CUFF) IMPLANT
DERMABOND ADVANCED (GAUZE/BANDAGES/DRESSINGS) ×4
DERMABOND ADVANCED .7 DNX12 (GAUZE/BANDAGES/DRESSINGS) ×2 IMPLANT
DRAIN CHANNEL 15F RND FF W/TCR (WOUND CARE) IMPLANT
DRAPE ISOLATION BAG 18X18 (DRAPES) ×2
DRAPE WARM FLUID 44X44 (DRAPE) ×5 IMPLANT
DRAPE X-RAY CASS 24X20 (DRAPES) ×3 IMPLANT
DRSG COVADERM 4X10 (GAUZE/BANDAGES/DRESSINGS) IMPLANT
DRSG COVADERM 4X8 (GAUZE/BANDAGES/DRESSINGS) IMPLANT
ELECT REM PT RETURN 9FT ADLT (ELECTROSURGICAL) ×5
ELECTRODE REM PT RTRN 9FT ADLT (ELECTROSURGICAL) ×3 IMPLANT
EVACUATOR SILICONE 100CC (DRAIN) IMPLANT
GAUZE SPONGE 4X4 16PLY XRAY LF (GAUZE/BANDAGES/DRESSINGS) ×3 IMPLANT
GLOVE BIO SURGEON STRL SZ7.5 (GLOVE) ×5 IMPLANT
GLOVE BIOGEL PI IND STRL 6.5 (GLOVE) ×1 IMPLANT
GLOVE BIOGEL PI IND STRL 8 (GLOVE) ×3 IMPLANT
GLOVE BIOGEL PI INDICATOR 6.5 (GLOVE) ×2
GLOVE BIOGEL PI INDICATOR 8 (GLOVE) ×2
GLOVE ECLIPSE 6.0 STRL STRAW (GLOVE) ×3 IMPLANT
GLOVE SURG SS PI 6.5 STRL IVOR (GLOVE) ×6 IMPLANT
GOWN STRL REUS W/ TWL LRG LVL3 (GOWN DISPOSABLE) ×9 IMPLANT
GOWN STRL REUS W/ TWL XL LVL3 (GOWN DISPOSABLE) ×2 IMPLANT
GOWN STRL REUS W/TWL LRG LVL3 (GOWN DISPOSABLE) ×15
GOWN STRL REUS W/TWL XL LVL3 (GOWN DISPOSABLE) ×10
GRAFT PROPATEN THIN WALL 6X80 (Vascular Products) ×3 IMPLANT
INSERT FOGARTY SM (MISCELLANEOUS) ×3 IMPLANT
KIT BASIN OR (CUSTOM PROCEDURE TRAY) ×5 IMPLANT
KIT ROOM TURNOVER OR (KITS) ×5 IMPLANT
KOVEN HADECO VDP-8 VASCULAR SINGLE USE PROBE ×3 IMPLANT
MARKER GRAFT CORONARY BYPASS (MISCELLANEOUS) IMPLANT
NS IRRIG 1000ML POUR BTL (IV SOLUTION) ×10 IMPLANT
PACK PERIPHERAL VASCULAR (CUSTOM PROCEDURE TRAY) ×5 IMPLANT
PAD ARMBOARD 7.5X6 YLW CONV (MISCELLANEOUS) ×10 IMPLANT
PADDING CAST COTTON 6X4 STRL (CAST SUPPLIES) IMPLANT
SET COLLECT BLD 21X3/4 12 (NEEDLE) ×4 IMPLANT
SPONGE INTESTINAL PEANUT (DISPOSABLE) ×3 IMPLANT
SPONGE SURGIFOAM ABS GEL 100 (HEMOSTASIS) IMPLANT
STAPLER VISISTAT (STAPLE) IMPLANT
STOPCOCK 4 WAY LG BORE MALE ST (IV SETS) ×4 IMPLANT
SUT ETHILON 3 0 PS 1 (SUTURE) IMPLANT
SUT PROLENE 5 0 C 1 24 (SUTURE) ×5 IMPLANT
SUT PROLENE 6 0 BV (SUTURE) ×11 IMPLANT
SUT PROLENE 6 0 CC (SUTURE) ×6 IMPLANT
SUT PROLENE 7 0 BV 1 (SUTURE) IMPLANT
SUT SILK 2 0 FS (SUTURE) ×5 IMPLANT
SUT SILK 3 0 (SUTURE)
SUT SILK 3-0 18XBRD TIE 12 (SUTURE) IMPLANT
SUT VIC AB 2-0 CTB1 (SUTURE) ×7 IMPLANT
SUT VIC AB 3-0 SH 27 (SUTURE) ×10
SUT VIC AB 3-0 SH 27X BRD (SUTURE) ×6 IMPLANT
SUT VICRYL 4-0 PS2 18IN ABS (SUTURE) ×10 IMPLANT
SYR 3ML LL SCALE MARK (SYRINGE) ×4 IMPLANT
TOWEL OR 17X24 6PK STRL BLUE (TOWEL DISPOSABLE) ×10 IMPLANT
TOWEL OR 17X26 10 PK STRL BLUE (TOWEL DISPOSABLE) ×10 IMPLANT
TRAY FOLEY CATH 16FRSI W/METER (SET/KITS/TRAYS/PACK) ×5 IMPLANT
TUBING EXTENTION W/L.L. (IV SETS) ×3 IMPLANT
UNDERPAD 30X30 INCONTINENT (UNDERPADS AND DIAPERS) ×5 IMPLANT
WATER STERILE IRR 1000ML POUR (IV SOLUTION) ×5 IMPLANT

## 2013-09-26 NOTE — Progress Notes (Signed)
Notified Dr. Phoebe Perch of pt. Having pacemaker/ICD. Stated the st. Jude rep. Does not need to be called. Also informed of glucose/hgb results. No new orders.

## 2013-09-26 NOTE — Progress Notes (Signed)
Paged Dr. Darrick Penna at [phone] regarding pts CBG [phone] and no coverage ordered. Awaiting call back.   No call back. Passed on to night shift nurse.

## 2013-09-26 NOTE — Progress Notes (Signed)
   VASCULAR PROGRESS NOTE  SUBJECTIVE: Some paresthesias right thumb and index finger which is improving. (Had attempted radial artery line on that side.   PHYSICAL EXAM: Filed Vitals:   09/26/13 1230 09/26/13 1238 09/26/13 1256 09/26/13 1307  BP: 136/59 136/59 135/63   Pulse: 73 76 79   Temp:  97.7 F (36.5 C)    TempSrc:      Resp: 14 16 14    Height:    5\' 9"  (1.753 m)  Weight:    194 lb 3.6 oz (88.1 kg)  SpO2: 100% 100% 97%    Left foot hyperemic Good doppler signals left foot (DP and PT) Incisions look fine.  LABS: Lab Results  Component Value Date   WBC 6.4 09/25/2013   HGB 9.5* 09/26/2013   HCT 28.0* 09/26/2013   MCV 74.9* 09/25/2013   PLT 212 09/25/2013   Lab Results  Component Value Date   CREATININE 0.59 09/25/2013   Lab Results  Component Value Date   INR 1.07 09/25/2013   CBG (last 3)   Recent Labs  09/26/13 0552 09/26/13 1121 09/26/13 1214  GLUCAP 271* 220* 183*    Active Problems:   PAD (peripheral artery disease)   ASSESSMENT AND PLAN:  * Stable post op  * VQI: On ASA. Not on a statin as he does not tolerate this.  * He was placed on Coumadin back in Decmeber for rapid A. Fib. His is now in A Flutter. Will need cardiology consult tomorrow in order to determine if he still needs Coumadin (He is on ASA-81 mg and Plavix).  09/28/13 Beeper07/02/15 09/26/2013

## 2013-09-26 NOTE — Transfer of Care (Signed)
Immediate Anesthesia Transfer of Care Note  Patient: [name] [name]  Procedure(s) Performed: Procedure(s): BYPASS GRAFT FEMORAL-BELOW KNEE POPLITEAL ARTERY USING GORE PROPATEN VASCULAR GRAFT (Left) INTRA OPERATIVE ARTERIOGRAM (Left)  Patient Location: PACU  Anesthesia Type:General  Level of Consciousness: awake, alert  and oriented  Airway & Oxygen Therapy: Patient Spontanous Breathing  Post-op Assessment: Report given to PACU RN  Post vital signs: Reviewed and stable  Complications: No apparent anesthesia complications

## 2013-09-26 NOTE — Interval H&P Note (Signed)
History and Physical Interval Note:  09/26/2013 7:18 AM  [name] [name]  has presented today for surgery, with the diagnosis of Atherosclerosis of native arteries of the extremities with ulceration  The various methods of treatment have been discussed with the patient and family. After consideration of risks, benefits and other options for treatment, the patient has consented to  Procedure(s): BYPASS GRAFT FEMORAL-BELOW KNEE POPLITEAL ARTERY (Left) VEIN HARVEST (Right) as a surgical intervention .  The patient's history has been reviewed, patient examined, no change in status, stable for surgery.  I have reviewed the patient's chart and labs.  Questions were answered to the patient's satisfaction.     DICKSON,CHRISTOPHER S

## 2013-09-26 NOTE — Anesthesia Postprocedure Evaluation (Signed)
  Anesthesia Post-op Note  Patient: [name] [name]  Procedure(s) Performed: Procedure(s): BYPASS GRAFT FEMORAL-BELOW KNEE POPLITEAL ARTERY USING GORE PROPATEN VASCULAR GRAFT (Left) INTRA OPERATIVE ARTERIOGRAM (Left)  Patient Location: PACU  Anesthesia Type:General  Level of Consciousness: awake and alert   Airway and Oxygen Therapy: Patient Spontanous Breathing  Post-op Pain: mild  Post-op Assessment: Post-op Vital signs reviewed  Post-op Vital Signs: stable  Last Vitals:  Filed Vitals:   09/26/13 1238  BP: 136/59  Pulse: 76  Temp: 36.5 C  Resp: 16    Complications: No apparent anesthesia complications

## 2013-09-26 NOTE — H&P (View-Only) (Signed)
 [facility] [place]  Patient name: [name] [name] MRN: [id number] DOB: 11/30/1950 Sex: male  REASON FOR VISIT: Follow up of nonhealing wound of the left great toe.  HPI: [name] [name] is a 62 y.o. male who I last saw on 08/23/2013. At that point he developed a left great toe wound from a blister. He was being followed by Dr. Nichols at the wound care center. He has undergone previous left external iliac artery angioplasty and stenting in 2011.   In addition, he had a right common femoral artery endarterectomy with bovine pericardial patch angioplasty and extensive stenting of the iliac system on the right by Dr. Brabham. 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 Early.  He has been having some rest pain in the left foot and claudication symptoms on the left. He denies fever or chills.   Past Medical History  Diagnosis Date  . Diabetes mellitus   . Hypertension   . Hyperlipidemia   . Rheumatoid arthritis   . CAD (coronary artery disease)   . Myocardial infarction 2000 & 2006  . GERD (gastroesophageal reflux disease)   . Leg pain   . CHF (congestive heart failure)   . Chronic kidney disease   . PAD (peripheral artery disease)   . Decubitus ulcer of right ankle   . 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 Jude - single chamber ICD  . Pacemaker    Family History  Problem Relation Age of Onset  . Hyperlipidemia Mother   . Diabetes Father   . Hypertension Father   . Heart disease Father   . Heart attack Father    SOCIAL HISTORY: History  Substance Use Topics  . Smoking status: Former Smoker    Types: Cigarettes    Quit date: 03/30/1997  . Smokeless tobacco: Never Used  . Alcohol Use: 0.6 oz/week    1 Glasses of wine per week   Allergies  Allergen Reactions  . Lipitor [Atorvastatin] Other (See Comments)    Weak muscles  . Lodine  [Etodolac] Nausea And Vomiting  . Methotrexate Derivatives Nausea And Vomiting  . Remicade [Infliximab] Other (See Comments)    Chills and shakes   . Ranitidine Hcl Nausea Only   Current Outpatient Prescriptions  Medication Sig Dispense Refill  . aspirin EC 81 MG tablet Take 81 mg by mouth daily.        . Calcium Carb-Cholecalciferol (CALCIUM 1000 + D PO) Take 1 tablet by mouth daily.        . carvedilol (COREG) 12.5 MG tablet Take 25 mg by mouth 2 (two) times daily with a meal.      . cephALEXin (KEFLEX) 500 MG capsule Take 1 capsule (500 mg total) by mouth 3 (three) times daily.  42 capsule  1  . clopidogrel (PLAVIX) 75 MG tablet Take 75 mg by mouth daily.        . digoxin (LANOXIN) 0.125 MG tablet Take 1 tablet (0.125 mg total) by mouth daily.  30 tablet  0  . feeding supplement, GLUCERNA SHAKE, (GLUCERNA SHAKE) LIQD Take 237 mLs by mouth daily at 3 pm.  30 Can  0  . ferrous gluconate (FERGON) 325 MG tablet Take 325 mg by mouth daily with breakfast.        . fish oil-omega-3 fatty acids 1000 MG capsule Take 2 g by mouth daily.        .   furosemide (LASIX) 40 MG tablet Take 1 tablet (40 mg total) by mouth daily.  30 tablet  0  . gabapentin (NEURONTIN) 300 MG capsule Take 600 mg by mouth 3 (three) times daily.      Marland Kitchen glyBURIDE (DIABETA) 5 MG tablet Take 1 tablet (5 mg total) by mouth 2 (two) times daily with a meal.  30 tablet  1  . glyBURIDE-metformin (GLUCOVANCE) 5-500 MG per tablet Take 3 tablets by mouth daily with breakfast. 2 Tabs am and 1 Tab at bedtime      . HYDROcodone-acetaminophen (NORCO) 10-325 MG per tablet Take 1 tablet by mouth every 6 (six) hours as needed.      . Insulin Glargine (LANTUS SOLOSTAR) 100 UNIT/ML Solostar Pen Inject 10 Units into the skin daily.      Marland Kitchen levofloxacin (LEVAQUIN) 500 MG tablet Take 1 tablet (500 mg total) by mouth daily.  10 tablet  0  . losartan (COZAAR) 25 MG tablet Take 1 tablet (25 mg total) by mouth daily.  30 tablet  0  . Magnesium 100 MG  TABS Take 1 tablet by mouth daily. Takes one 400 mg tablet daily.      . nitroGLYCERIN (NITROSTAT) 0.4 MG SL tablet Place 1 tablet (0.4 mg total) under the tongue every 5 (five) minutes x 3 doses as needed for chest pain.  10 tablet  0  . pantoprazole (PROTONIX) 40 MG tablet Take 1 tablet (40 mg total) by mouth 2 (two) times daily.  60 tablet  0  . predniSONE (DELTASONE) 5 MG tablet Take 5 mg by mouth 2 (two) times daily.       . sitaGLIPtin (JANUVIA) 100 MG tablet Take 100 mg by mouth at bedtime.       Marland Kitchen warfarin (COUMADIN) 5 MG tablet Take 4 mg by mouth daily at 6 PM.      . ezetimibe (ZETIA) 10 MG tablet Take 10 mg by mouth at bedtime.       . isosorbide mononitrate (IMDUR) 60 MG 24 hr tablet Take 30 mg by mouth daily.       Marland Kitchen oxyCODONE-acetaminophen (PERCOCET/ROXICET) 5-325 MG per tablet Take 1-2 tablets by mouth every 4 (four) hours as needed for severe pain.  30 tablet  0   No current facility-administered medications for this visit.   REVIEW OF SYSTEMS: Arly.Keller ] denotes positive finding; [  ] denotes negative finding  CARDIOVASCULAR:  [ ]  chest pain   [ ]  chest pressure   [ ]  palpitations   [ ]  orthopnea   ] dyspnea on exertion   ] claudication   ] rest pain   [ ]  DVT   [ ]  phlebitis PULMONARY:   [ ]  productive cough   [ ]  asthma   [ ]  wheezing NEUROLOGIC:   [ ]  weakness  [ ]  paresthesias  [ ]  aphasia  [ ]  amaurosis  [ ]  dizziness HEMATOLOGIC:   [ ]  bleeding problems   [ ]  clotting disorders MUSCULOSKELETAL:  [ ]  joint pain   [ ]  joint swelling [ ]  leg swelling GASTROINTESTINAL: [ ]   blood in stool  [ ]   hematemesis GENITOURINARY:  [ ]   dysuria  [ ]   hematuria PSYCHIATRIC:  [ ]  history of major depression INTEGUMENTARY:  [ ]  rashes  [ ]  ulcers CONSTITUTIONAL:  [ ]  fever   [ ]  chills  PHYSICAL EXAM: Filed Vitals:   09/20/13 1058  BP: 158/72  Pulse: 84  Temp: 97.8  F (36.6 C)  TempSrc: Oral  Height: 5' 8.5" (1.74 m)  Weight: 194 lb (87.998 kg)  SpO2: 100%   Body mass  index is 29.07 kg/(m^2). GENERAL: The patient is a well-nourished male, in no acute distress. The vital signs are documented above. CARDIOVASCULAR: There is a regular rate and rhythm. He has a palpable left femoral pulse. I cannot palpate popliteal or pedal pulses on the left. The right foot is warm and well-perfused. He has chronic ischemia of the left foot. PULMONARY: There is good air exchange bilaterally without wheezing or rales. ABDOMEN: Soft and non-tender with normal pitched bowel sounds.  MUSCULOSKELETAL: There are no major deformities or cyanosis. NEUROLOGIC: No focal weakness or paresthesias are detected. SKIN: There is an open wound that is fairly deep on the left great toe. PSYCHIATRIC: The patient has a normal affect.  DATA: His most recent arterial study was in December of 2014. At that time he had triphasic flow throughout the iliac system on the left with a toe brachial index of 0.35. He has calcific vessels which were noncompressible and ABIs cannot be obtained.   MEDICAL ISSUES:  Atherosclerosis of native arteries of the extremities with ulceration(440.23) This patient has a nonhealing wound of his left great toe with evidence of infrainguinal arterial occlusive disease. I've recommended that we proceed with arteriography. I will need to stick the left groin given the wound in the right groin. I have reviewed with the patient the indications for arteriography. In addition, I have reviewed the potential complications of arteriography including but not limited to: Bleeding, arterial injury, arterial thrombosis, dye action, renal insufficiency, or other unpredictable medical problems. I have explained to the patient that if we find disease amenable to angioplasty we could potentially address this at the same time. I have discussed the potential complications of angioplasty and stenting, including but not limited to: Bleeding, arterial thrombosis, arterial injury, dissection, or the  need for surgical intervention. This has been scheduled for 09/25/2013. We will have him stop his Coumadin 5 days prior to his procedure. He tells me that he is no longer in A. Fib and was hoping to stop his Coumadin completely. We will try to check with his cardiologist concerning this issue. I will make further recommendations pending the results of his arteriogram.    DICKSON,CHRISTOPHER S [facility] [place] [facility]: [phone]

## 2013-09-26 NOTE — Anesthesia Preprocedure Evaluation (Addendum)
Anesthesia Evaluation  Patient identified by MRN, date of birth, ID band Patient awake    Reviewed: Allergy & Precautions, Patient's Chart, lab work & pertinent test results  Airway Mallampati: I TM Distance: >3 FB Neck ROM: Full    Dental   Pulmonary shortness of breath, former smoker,  breath sounds clear to auscultation        Cardiovascular hypertension, + CAD, + Past MI, + Peripheral Vascular Disease and +CHF + Cardiac Defibrillator Rhythm:Irregular Rate:Normal     Neuro/Psych    GI/Hepatic GERD-  ,  Endo/Other  diabetes  Renal/GU      Musculoskeletal  (+) Arthritis -,   Abdominal   Peds  Hematology  (+) anemia ,   Anesthesia Other Findings   Reproductive/Obstetrics                          Anesthesia Physical Anesthesia Plan  ASA: IV  Anesthesia Plan: General   Post-op Pain Management:    Induction: Intravenous  Airway Management Planned: Oral ETT  Additional Equipment:   Intra-op Plan:   Post-operative Plan: Extubation in OR and Possible Post-op intubation/ventilation  Informed Consent: I have reviewed the patients History and Physical, chart, labs and discussed the procedure including the risks, benefits and alternatives for the proposed anesthesia with the patient or authorized representative who has indicated his/her understanding and acceptance.   Dental advisory given  Plan Discussed with: CRNA, Surgeon and Anesthesiologist  Anesthesia Plan Comments:        Anesthesia Quick Evaluation

## 2013-09-26 NOTE — Progress Notes (Signed)
Utilization review completed.  

## 2013-09-26 NOTE — Op Note (Signed)
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  SURGEON: [name], MD, FACS  ASSIST: Lianne Cure PA  ANESTHESIA: Gen.   EBL: 200 cc  INDICATIONS: [name] [name] is a 63 y.o. male who presented with a nonhealing wound of his left great toe. He underwent an arteriogram which showed diffuse calcific disease. He had a superficial femoral artery occlusion with reconstitution of the below knee popliteal artery. He is brought in for revascularization as his best chance for limb salvage.  FINDINGS: He had diffuse calcific disease. The below-knee popliteal artery was diffusely calcific but patent. The common femoral artery likewise was diffusely calcific and I had to use a 4 mm Fogarty for proximal control as the vessel could not be clamped adequately.  TECHNIQUE: The patient was taken to the operating room and received a general anesthetic. I looked at both the remaining vein in the left leg and also the greater saphenous vein on the right that was remaining. It was not enough adequate vein for an autogenous bypass. An oblique incision was made in the left groin above the inguinal crease. Once I entered the subcutaneous tissue I dissected longitudinally down to the common femoral artery which was diffusely calcified. I dissected well onto the inguinal ligament to find an area where I thought I could clamp however even with clamping the vessel here there was still a pulse in the artery. The dissection was carried down to the deep femoral artery and superficial femoral artery which were controlled with vessel loops. Some smaller side branches were controlled with 2-0 silk ties. A  separate longitudinal incision was made below the knee and the greater saphenous vein here was preserved. The dissection was carried down to the below knee popliteal artery which was diffusely calcified. I divided the soleus muscle and divided the anterior tibial vein to allow exposure of the tibial peroneal trunk and this too was diffusely calcified. I do not think there was any endpoint to this calcific disease and elected to bypass to the below-knee popliteal artery. A tunnel was created between the 2 incisions and a 6 mm PTFE graft tunneled between the 2 incisions. The patient was then heparinized. Once the heparin had circulated,  The common femoral artery was clamped and the distal vessels were controlled with Vesseloops. A longitudinal arteriotomy was made. I then advanced a 4 for repeat catheter up into the common femoral artery and released the clamp proximal control was obtained using the Fogarty balloon. 6 mm PTFE was spatulated and sewn end to side to the artery using continuous 6-0 Prolene suture. The balloon popped at one point and this had to be replaced. The anastomosis was completed and the graft clamped proximally. It was a good pulse in the graft at this point. The graft and pulled the appropriate length for anastomosis to the below-knee popliteal artery.  A tourniquet was placed on the thigh and the leg exsanguinated with an Esmarch bandage. The tourniquet was inflated to 300 mm of mercury. Under tourniquet control, a longitudinal arteriotomy was made in the below knee popliteal artery. This was extended proximally and distally. The graft was cut to the appropriate length, spatulated, and sewn end-to-side to the below knee popliteal  artery using continuous 6-0 Prolene suture. Prior to completing the anastomosis, the tourniquet was released, the arteries were backbled and flushed perfectly and the anastomosis completed. Closure established to the left foot and there was a good anterior tibial  and posterior tibial signal with the Doppler. An intraoperative arteriogram was obtained which showed diffuse tibial disease but 2 vessel runoff dominantly to the anterior tibial and posterior tibial arteries. No technical problems were identified. Hemostasis was obtained in the wound. The incision below the knee was closed deep layer of 3-0 Vicryl, the subcutaneous layer was closed with 3-0 Vicryl, and the skin was closed with a 4-0 subcuticular stitch. The groin incision was closed with deep layer of 2-0 Vicryl. The subcutaneous layer with 2-0 Vicryl. The skin was then closed with a 4-0 subcuticular stitch. Dermabond was applied. The patient tolerated the procedure well was transferred to the recovery room in stable condition. All needle and sponge counts were correct.  Waverly Ferrari, MD, FACS [facility]  DATE OF DICTATION:   09/26/2013

## 2013-09-27 ENCOUNTER — Encounter (HOSPITAL_COMMUNITY): Payer: Self-pay | Admitting: Vascular Surgery

## 2013-09-27 ENCOUNTER — Ambulatory Visit: Payer: Medicare Other | Admitting: Vascular Surgery

## 2013-09-27 LAB — BASIC METABOLIC PANEL
BUN: 7 mg/dL (ref 6–23)
CALCIUM: 8.5 mg/dL (ref 8.4–10.5)
CHLORIDE: 101 meq/L (ref 96–112)
CO2: 26 mEq/L (ref 19–32)
Creatinine, Ser: 0.71 mg/dL (ref 0.50–1.35)
GFR calc Af Amer: >90 mL/min (ref >90)
GFR calc non Af Amer: >90 mL/min (ref >90)
GLUCOSE: 310 mg/dL — AB (ref 70–99)
POTASSIUM: 4.5 meq/L (ref 3.7–5.3)
SODIUM: 138 meq/L (ref 137–147)

## 2013-09-27 LAB — GLUCOSE, CAPILLARY
GLUCOSE-CAPILLARY: 259 mg/dL — AB (ref 70–99)
GLUCOSE-CAPILLARY: 313 mg/dL — AB (ref 70–99)
Glucose-Capillary: 228 mg/dL — ABNORMAL HIGH (ref 70–99)
Glucose-Capillary: 268 mg/dL — ABNORMAL HIGH (ref 70–99)

## 2013-09-27 LAB — CBC
HCT: 25.3 % — ABNORMAL LOW (ref 39.0–52.0)
HEMOGLOBIN: 7.2 g/dL — AB (ref 13.0–17.0)
MCH: 21.7 pg — ABNORMAL LOW (ref 26.0–34.0)
MCHC: 28.5 g/dL — ABNORMAL LOW (ref 30.0–36.0)
MCV: 76.2 fL — ABNORMAL LOW (ref 78.0–100.0)
PLATELETS: 220 10*3/uL (ref 150–400)
RBC: 3.32 MIL/uL — AB (ref 4.22–5.81)
RDW: 20.3 % — ABNORMAL HIGH (ref 11.5–15.5)
WBC: 8 10*3/uL (ref 4.0–10.5)

## 2013-09-27 MED ORDER — LEVOFLOXACIN 500 MG PO TABS
500.0000 mg | ORAL_TABLET | Freq: Every day | ORAL | Status: DC
  Administered 2013-09-27 – 2013-09-28 (×2): 500 mg via ORAL
  Filled 2013-09-27 (×2): qty 1

## 2013-09-27 MED ORDER — WARFARIN - PHARMACIST DOSING INPATIENT: Freq: Every day | Status: DC

## 2013-09-27 MED ORDER — WARFARIN SODIUM 6 MG PO TABS
6.0000 mg | ORAL_TABLET | Freq: Once | ORAL | Status: AC
  Administered 2013-09-27: 6 mg via ORAL
  Filled 2013-09-27: qty 1

## 2013-09-27 MED ORDER — WARFARIN VIDEO
Freq: Once | Status: AC
Start: 2013-09-27 — End: 2013-09-27
  Administered 2013-09-27: 18:00:00

## 2013-09-27 MED ORDER — COLLAGENASE 250 UNIT/GM EX OINT
TOPICAL_OINTMENT | Freq: Every day | CUTANEOUS | Status: DC
  Administered 2013-09-27 – 2013-09-28 (×2): via TOPICAL
  Filled 2013-09-27: qty 30

## 2013-09-27 MED ORDER — ENOXAPARIN SODIUM 100 MG/ML ~~LOC~~ SOLN
1.0000 mg/kg | Freq: Two times a day (BID) | SUBCUTANEOUS | Status: DC
  Administered 2013-09-27 – 2013-09-28 (×2): 90 mg via SUBCUTANEOUS
  Filled 2013-09-27 (×4): qty 1

## 2013-09-27 MED ORDER — PATIENT'S GUIDE TO USING COUMADIN BOOK
Freq: Once | Status: DC
  Filled 2013-09-27: qty 1

## 2013-09-27 NOTE — Progress Notes (Signed)
     I spoke with Dr. Rennis Golden and he recommended bridging with Lovenox and restarting coumadin.  I placed the orders today.  Plan D/C home tomorrow.  I will also consult care management for financial assistants if needed with Lovenox.  Clinton Gallant [facility] 09/27/2013 2:12 PM

## 2013-09-27 NOTE — Progress Notes (Signed)
ANTICOAGULATION CONSULT NOTE - Initial Consult  Pharmacy Consult for LMWH/coumadin Indication: atrial fibrillation  Allergies  Allergen Reactions  . Lipitor [Atorvastatin] Other (See Comments)    Weak muscles  . Lodine [Etodolac] Nausea And Vomiting  . Methotrexate Derivatives Nausea And Vomiting  . Remicade [Infliximab] Other (See Comments)    Chills and shakes   . Ranitidine Hcl Nausea Only    Patient Measurements: Height: 5\' 9"  (175.3 cm) Weight: 194 lb 3.6 oz (88.1 kg) IBW/kg (Calculated) : 70.7   Vital Signs: Temp: 97.5 F (36.4 C) (07/01 1112) Temp src: Oral (07/01 1112) BP: 137/61 mmHg (07/01 1112) Pulse Rate: 74 (07/01 1112)  Labs:  Recent Labs  09/25/13 0603  09/25/13 1257 09/26/13 0607 09/26/13 1358 09/27/13 0340  HGB  --   < > 8.3* 9.5* 7.4* 7.2*  HCT  --   < > 28.7* 28.0* 25.9* 25.3*  PLT  --   --  212  --  241 220  APTT  --   --  26  --   --   --   LABPROT 13.9  --   --   --   --   --   INR 1.07  --   --   --   --   --   CREATININE  --   < > 0.59  --  0.66 0.71  < > = values in this interval not displayed.  Estimated Creatinine Clearance: 107.9 ml/min (by C-G formula based on Cr of 0.71).   Medical History: Past Medical History  Diagnosis Date  . Diabetes mellitus   . Hypertension   . Hyperlipidemia   . Rheumatoid arthritis   . CAD (coronary artery disease)   . Myocardial infarction 2000 & 2006  . GERD (gastroesophageal reflux disease)   . Leg pain   . CHF (congestive heart failure)   . PAD (peripheral artery disease)   . Decubitus ulcer of right ankle   . 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 Jude - single chamber ICD  . Shortness of breath     walking  . History of blood transfusion   . Chronic kidney disease     stage 2 per Dr 06/04/2012 notes.    Medications:  Prescriptions prior to admission  Medication Sig Dispense Refill  . aspirin EC 81 MG tablet Take 81 mg by mouth daily.        .  Calcium Carb-Cholecalciferol (CALCIUM 1000 + D PO) Take 1 tablet by mouth daily.        . carvedilol (COREG) 25 MG tablet Take 25 mg by mouth 2 (two) times daily with a meal.      . cephALEXin (KEFLEX) 500 MG capsule Take 1 capsule (500 mg total) by mouth 3 (three) times daily.  42 capsule  1  . clopidogrel (PLAVIX) 75 MG tablet Take 75 mg by mouth daily.        . digoxin (LANOXIN) 0.125 MG tablet Take 1 tablet (0.125 mg total) by mouth daily.  30 tablet  0  . ferrous gluconate (FERGON) 325 MG tablet Take 325 mg by mouth daily with breakfast.        . fish oil-omega-3 fatty acids 1000 MG capsule Take 2 g by mouth daily.        . furosemide (LASIX) 40 MG tablet Take 1 tablet (40 mg total) by mouth daily.  30 tablet  0  . gabapentin (NEURONTIN) 300 MG capsule Take 600 mg  by mouth 3 (three) times daily.      Marland Kitchen glyBURIDE-metformin (GLUCOVANCE) 5-500 MG per tablet Take 1-2 tablets by mouth daily with breakfast. 2 Tabs am and 1 Tab at bedtime      . HYDROcodone-acetaminophen (NORCO) 10-325 MG per tablet Take 1 tablet by mouth every 6 (six) hours as needed.      . Insulin Glargine (LANTUS SOLOSTAR) 100 UNIT/ML Solostar Pen Inject 10 Units into the skin daily.      Marland Kitchen leflunomide (ARAVA) 20 MG tablet Take 20 mg by mouth daily.      Marland Kitchen levofloxacin (LEVAQUIN) 500 MG tablet Take 1 tablet (500 mg total) by mouth daily.  10 tablet  0  . losartan (COZAAR) 25 MG tablet Take 1 tablet (25 mg total) by mouth daily.  30 tablet  0  . magnesium oxide (MAG-OX) 400 MG tablet Take 400 mg by mouth daily.      . pantoprazole (PROTONIX) 40 MG tablet Take 1 tablet (40 mg total) by mouth 2 (two) times daily.  60 tablet  0  . potassium chloride SA (K-DUR,KLOR-CON) 20 MEQ tablet Take 20 mEq by mouth daily.      . predniSONE (DELTASONE) 5 MG tablet Take 5 mg by mouth 3 (three) times daily.       . sitaGLIPtin (JANUVIA) 100 MG tablet Take 100 mg by mouth at bedtime.       . nitroGLYCERIN (NITROSTAT) 0.4 MG SL tablet Place 1  tablet (0.4 mg total) under the tongue every 5 (five) minutes x 3 doses as needed for chest pain.  10 tablet  0    Assessment: Mr. Poehlman is a 63 yo man to resume coumadin with a lovenox bridge for his atrial flutter.  PTA his coumadin dose was 4 mg daily.  He says this usually keeps his INR in the 2.2 - 2.7 range.  He said he had bleeding problems from a surgical wound in the past when his INR was in the 6 range.  He has a home INR machine.  He stopped his coumadin on Wednesday 6/24 in preparation for surgery.  He is now POD #1 s/p left fem BK pop PTFE.   He received LMWH 40 mg this am.  Wt 88 kg.  Creat cl good.  Hg 7.2, pltc 220.  No bleeding reported.  On po levaquin for cellulitis of toe which may increase INR.    Goal of Therapy:  INR 2-3 Monitor platelets by anticoagulation protocol: Yes   Plan:  1. LMWH 1 mg/kg sq q12h = 90 mg sq q12h 2. Coumadin 6 mg po x 1 dose today 3. Daily INR 4. Pt educated about coumadin  Herby Abraham, Pharm.D. [phone] 09/27/2013 2:55 PM

## 2013-09-27 NOTE — Progress Notes (Signed)
Please refer to notes and discussion about anticoagulation in December 2014. Decision was made at that time to put the patient on warfarin for persistent atrial flutter, but felt he needed Plavix as well for PAD. He remains in atrial flutter and has a cardiomyopathy (EF 20-25%), which may put him at higher risk for intracardiac thrombus. I would recommend continuing warfarin anticoagulation. Lovenox bridging at discharge would be reasonable until he becomes therapeutic.  Please call with questions.  Chrystie Nose, MD, [facility] Attending Cardiologist [facility]

## 2013-09-27 NOTE — Evaluation (Addendum)
Physical Therapy Evaluation Patient Details Name: [name] [name] MRN: [id number] DOB: Apr 13, 1950 Today's Date: 09/27/2013   History of Present Illness  63 yo male s/p BYPASS GRAFT FEMORAL-BELOW KNEE POPLITEAL ARTERY  LT LE hx of DM, HTN, CAD, MI, CHF, PAD, decubitus on Rt ankle, pacemaker  Clinical Impression  Patient is s/p surgery listed above resulting in functional limitations due to the deficits listed below (see PT Problem List).  Patient will benefit from skilled PT to increase their independence and safety with mobility to allow discharge to the venue listed below. Pt hopeful to D/C tomorrow and very motivated. Will benefit from additional PT session to address mobility without RW per pt request.     Follow Up Recommendations No PT follow up    Equipment Recommendations  Rolling walker with 5" wheels    Recommendations for Other Services       Precautions / Restrictions Precautions Precautions: None Precaution Comments: darco shoe Rt Restrictions Weight Bearing Restrictions: No      Mobility  Bed Mobility Overal bed mobility: Modified Independent                Transfers Overall transfer level: Needs assistance Equipment used: Rolling walker (2 wheeled) Transfers: Sit to/from Stand Sit to Stand: Supervision         General transfer comment: supervision for cues for safety with RW and proper hand placement   Ambulation/Gait Ambulation/Gait assistance: Supervision Ambulation Distance (Feet): 120 Feet Assistive device: Rolling walker (2 wheeled) Gait Pattern/deviations: Decreased stance time - left;Decreased step length - right;Antalgic;Wide base of support;Trunk flexed Gait velocity: decreased due to pain Gait velocity interpretation: Below normal speed for age/gender General Gait Details: cues for upright posture and gt sequencing; relied heavily on RW but would like to attempt gt without RW next session if pain is controlled  Stairs             Wheelchair Mobility    Modified Rankin (Stroke Patients Only)       Balance Overall balance assessment: No apparent balance deficits (not formally assessed)                                           Pertinent Vitals/Pain 7/10; premedicated and Lt LE elevated with pillows.     Home Living Family/patient expects to be discharged to:: Private residence Living Arrangements: Spouse/significant other Available Help at Discharge: Family;Available 24 hours/day Type of Home: House Home Access: Stairs to enter Entrance Stairs-Rails: Can reach both Entrance Stairs-Number of Steps: 2 Home Layout: One level Home Equipment: Walker - standard;Shower seat;Wheelchair - Pharmacist, hospital Comments: uses scooter to ride to [employer] located [employer] only    Prior Function Level of Independence: Needs assistance   Gait / Transfers Assistance Needed: was using a RW, was in hospital before xmas  ADL's / Homemaking Assistance Needed: requires (A) with bathing and wound dressing. Bathing (A) is for setup due to wounds are unable to take shower        Hand Dominance   Dominant Hand: Right    Extremity/Trunk Assessment   Upper Extremity Assessment: Defer to OT evaluation           Lower Extremity Assessment: LLE deficits/detail      Cervical / Trunk Assessment: Normal  Communication   Communication: No difficulties  Cognition Arousal/Alertness: Awake/alert Behavior During Therapy: WFL for tasks assessed/performed  Overall Cognitive Status: Within Functional Limits for tasks assessed                      General Comments General comments (skin integrity, edema, etc.): wound Rt heel being followed by [facility]    Exercises Low Level/ICU Exercises Ankle Circles/Pumps: AROM;Both;10 reps;Supine      Assessment/Plan    PT Assessment Patient needs continued PT services  PT Diagnosis Abnormality of gait;Acute pain;Generalized  weakness   PT Problem List Decreased strength;Decreased range of motion;Decreased activity tolerance;Decreased balance;Decreased mobility;Pain;Impaired sensation  PT Treatment Interventions DME instruction;Gait training;Functional mobility training;Therapeutic activities;Therapeutic exercise;Neuromuscular re-education;Balance training;Patient/family education   PT Goals (Current goals can be found in the Care Plan section) Acute Rehab PT Goals Patient Stated Goal: to go home tomorrow PT Goal Formulation: With patient Time For Goal Achievement: 09/30/13 Potential to Achieve Goals: Good    Frequency Min 3X/week   Barriers to discharge        Co-evaluation               End of Session Equipment Utilized During Treatment: Gait belt (darco shoe ) Activity Tolerance: Patient tolerated treatment well Patient left: in bed;with call bell/phone within reach Nurse Communication: Mobility status         Time: 1610-9604 PT Time Calculation (min): 15 min   Charges:   PT Evaluation $Initial PT Evaluation Tier I: 1 Procedure PT Treatments $Gait Training: 8-22 mins   PT G CodesDonell [name], [name] [name]  [phone] 09/27/2013, 4:48 PM

## 2013-09-27 NOTE — Progress Notes (Signed)
Pt received into room 2w09, pt oriented to room and call bell, pt assisted to bed, tele placed on pt, will continue to monitor Archie Balboa, RN

## 2013-09-27 NOTE — Consult Note (Signed)
WOC wound consult note Reason for Consult: evaluation of left great toe and right groin.  Pt with history of infected patch in the right groin and subsequent removal at which time he has remained with continued open wound since Dec. 2014.  He developed ulceration of the left great toe this year and has been followed by the [facility] wound care center for same.  He has HHRN and his wife performing wound care.  Reports use of saline gel to the right groin and saline only to the left great toe most recently.  Has used silver gel on the left toe also in the past.He reports new area on the dorsal surface of the toe developed recently after the use of the silver, but I feel it appears more likely due to the limited blood flow to the foot.  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  Continue at the time of DC until he follows up with wound care center or [facility].  Hydrogel to the right groin daily, cover with dry dressing and follow up with [facility] as scheduled for this wound.  Discussed POC with patient and bedside nurse.  Re consult if needed, will not follow at this time. Thanks  Manning [employer], CWOCN [phone])

## 2013-09-27 NOTE — Evaluation (Signed)
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 All education has been completed and the patient has no further questions. See below for any follow-up Occupational Therapy or equipment needs. OT to sign off. Thank you for referral.      Follow Up Recommendations  No OT follow up    Equipment Recommendations  None recommended by OT    Recommendations for Other Services       Precautions / Restrictions Precautions Precautions: None      Mobility Bed Mobility Overal bed mobility: Modified Independent                Transfers Overall transfer level: Modified independent                    Balance                                            ADL Overall ADL's : At baseline                                       General ADL Comments: Pt completed don doff boxers, bath from waist down, Don doff shoe, bed level mobiltiy and ambulation to toilet     Vision                     Perception     Praxis      Pertinent Vitals/Pain Premedicated and pt reports "you timed this just right"     Hand Dominance Right   Extremity/Trunk Assessment Upper Extremity Assessment Upper Extremity Assessment: Overall WFL for tasks assessed   Lower Extremity Assessment Lower Extremity Assessment: Defer to PT evaluation   Cervical / Trunk Assessment Cervical / Trunk Assessment: Normal   Communication Communication Communication: No difficulties   Cognition Arousal/Alertness: Awake/alert Behavior During Therapy: WFL for tasks assessed/performed Overall Cognitive Status:  Within Functional Limits for tasks assessed                     General Comments       Exercises       Shoulder Instructions      Home Living Family/patient expects to be discharged to:: Private residence Living Arrangements: Spouse/significant other                 Bathroom Shower/Tub: Other (comment) (sponge bath only)   Bathroom Toilet: Standard     Home Equipment: Walker - standard;Shower seat;Wheelchair - Consulting civil engineer Comments: uses scooter to ride to son's shop located [employer] only      Prior Functioning/Environment Level of Independence: Needs assistance    ADL's / Homemaking Assistance Needed: requires (A) with bathing and wound dressing. Bathing (A) is for setup due to wounds are unable to take shower        OT Diagnosis:     OT Problem List:     OT Treatment/Interventions:  OT Goals(Current goals can be found in the care plan section)    OT Frequency:     Barriers to D/C:            Co-evaluation              End of Session Nurse Communication: Mobility status;Precautions  Activity Tolerance: Patient tolerated treatment well Patient left: in bed;with call bell/phone within reach;with family/visitor present   Time: 1344-1411 OT Time Calculation (min): 27 min Charges:  OT General Charges $OT Visit: 1 Procedure OT Evaluation $Initial OT Evaluation Tier I: 1 Procedure OT Treatments $Self Care/Home Management : 8-22 mins G-Codes:    Harolyn Rutherford 10/05/13, 2:59 PM Pager: [phone]

## 2013-09-27 NOTE — Progress Notes (Signed)
   VASCULAR PROGRESS NOTE  SUBJECTIVE: Pain well controlled.   PHYSICAL EXAM: Filed Vitals:   09/26/13 1617 09/26/13 2012 09/26/13 2307 09/27/13 0406  BP: 110/50 127/52 128/53 148/63  Pulse: 86 83 74 76  Temp:  98 F (36.7 C) 98.3 F (36.8 C) 98.2 F (36.8 C)  TempSrc:  Oral Oral Oral  Resp: 15 24 14 13   Height:      Weight:      SpO2: 98% 99% 98% 96%   Brisk doppler signals left foot. L great toe wound inspected. No significant drainage. Mild cellulitis Incisions look fine.   LABS: Lab Results  Component Value Date   WBC 8.0 09/27/2013   HGB 7.2* 09/27/2013   HCT 25.3* 09/27/2013   MCV 76.2* 09/27/2013   PLT 220 09/27/2013   Lab Results  Component Value Date   CREATININE 0.71 09/27/2013   Lab Results  Component Value Date   INR 1.07 09/25/2013   CBG (last 3)   Recent Labs  09/26/13 1337 09/26/13 1742 09/26/13 2142  GLUCAP 135* 316* 371*    Active Problems:   PAD (peripheral artery disease)   ASSESSMENT AND PLAN:  * 1 Day Post-Op s/p: Left Fem BK Pop PTFE. Graft patent  * D/C Foley this AM  * Add Levaquin (po) for cellulitis of toe  * Lovenox for DVT prophylaxis  * Cardiac: Needs Cardiology consult to determine if he still needs Coumadin. He was admitted in Dec 2014 with rapid A Fib and started on Coumadin. He is now in A Fl[place].   * Transfer to 2000. Ambulate.  * Disposition: He will likely go home tomorrow. If he needs Coumadin, then we need to arrange for him to get a Lovenox bridge at home.   * VQI: He does not take a statin bc he does not tolerate this. He is on ASA (81 mg) and is on Plavix.     Jan 2015 BeeperCari Caraway 09/27/2013

## 2013-09-28 ENCOUNTER — Telehealth: Payer: Self-pay | Admitting: Vascular Surgery

## 2013-09-28 LAB — GLUCOSE, CAPILLARY: GLUCOSE-CAPILLARY: 242 mg/dL — AB (ref 70–99)

## 2013-09-28 LAB — PROTIME-INR
INR: 1.12 (ref 0.00–1.49)
Prothrombin Time: 14.4 seconds (ref 11.6–15.2)

## 2013-09-28 MED ORDER — CEPHALEXIN 500 MG PO CAPS: 500.0000 mg | ORAL_CAPSULE | Freq: Three times a day (TID) | ORAL | Status: AC

## 2013-09-28 MED ORDER — OXYCODONE-ACETAMINOPHEN 5-325 MG PO TABS: 1.0000 | ORAL_TABLET | Freq: Four times a day (QID) | ORAL | Status: AC | PRN

## 2013-09-28 MED ORDER — ENOXAPARIN SODIUM 100 MG/ML ~~LOC~~ SOLN: 1.0000 mg/kg | Freq: Two times a day (BID) | SUBCUTANEOUS | Status: AC

## 2013-09-28 MED ORDER — WARFARIN SODIUM 6 MG PO TABS: 6.0000 mg | ORAL_TABLET | Freq: Every day | ORAL | Status: AC

## 2013-09-28 MED ORDER — LEVOFLOXACIN 500 MG PO TABS
500.0000 mg | ORAL_TABLET | Freq: Every day | ORAL | Status: AC
Start: 2013-09-28 — End: ?

## 2013-09-28 NOTE — Progress Notes (Signed)
Inpatient Diabetes Program Recommendations  AACE/ADA: New Consensus Statement on Inpatient Glycemic Control (2013)  Target Ranges:  Prepandial:   less than 140 mg/dL      Peak postprandial:   less than 180 mg/dL (1-2 hours)      Critically ill patients:  140 - 180 mg/dL  Results for CASE, VASSELL (MRN [id number]) as of 09/28/2013 10:12  Ref. Range 09/27/2013 07:46 09/27/2013 11:07 09/27/2013 16:06 09/27/2013 21:26 09/28/2013 06:36  Glucose-Capillary Latest Range: 70-99 mg/dL 030 (H) 092 (H) 330 (H) 313 (H) 242 (H)   Inpatient Diabetes Program Recommendations Correction (SSI): add Novolog resistant scale TID + HS scale  Thank you  Piedad Climes BSN, RN,CDE Inpatient Diabetes Coordinator [phone] (team pager)

## 2013-09-28 NOTE — Care Management Note (Signed)
    Page 1 of 2   09/28/2013     3:18:47 PM CARE MANAGEMENT NOTE 09/28/2013  Patient:  [name] [name], [name] [name]   Account Number:  [id number]  Date Initiated:  09/28/2013  Documentation initiated by:  Mozell Haber  Subjective/Objective Assessment:   Pt s/p lt fem pop on 09/26/13.  PTA, pt independent, lives with spouse.     Action/Plan:   Pt to dc home today with family.  Needs home Lovenox and HHRN follow up for wound care.   Anticipated DC Date:  09/28/2013   Anticipated DC Plan:  HOME W HOME HEALTH SERVICES      DC Planning Services  CM consult      [facility]  HOME HEALTH   Choice offered to / List presented to:  C-1 Patient   DME arranged  Levan Hurst      DME agency  [facility]     [facility] arranged  HH-1 RN      [facility] agency  OTHER - SEE NOTE   Status of service:  Completed, signed off Medicare Important Message given?  NA - LOS <3 / Initial given by admissions (If response is "NO", the following Medicare IM given date fields will be blank) Date Medicare IM given:   Medicare IM given by:   Date Additional Medicare IM given:   Additional Medicare IM given by:    Discharge Disposition:  HOME W HOME HEALTH SERVICES  Per UR Regulation:  Reviewed for med. necessity/level of care/duration of stay  If discussed at Long Length of Stay Meetings, dates discussed:    Comments:  09/28/13 Sidney Ace, RN, BSN [phone] Pt active with [facility] in [place], [place] prior to admission for wound care.  Order received for [facility] with wound care/dressing change instructions.  Faxed referral and orders to [facility] agency; fax # [phone].  Checked insurance coverage for Lovenox:   ENOXAPARIN COVER - YES CO-PAY - $ 2.65 PRIOR APPROVAL -NO PHARMACY -STANDARD  ** LOVEONOX  ** CO-PAY $ 6.60  Per pharmacist, pt does not need to check PT/INR daily. Best to check INR on Sunday, July 5.  Pt only gets limited # of INR test strips paid for by Medicare.  PA agrees  with this plan, and pt made aware.

## 2013-09-28 NOTE — Discharge Summary (Signed)
[facility] Discharge Summary  TIMM BONENBERGER 1950/04/22 63 y.o. male  [id number]  Admission Date: 09/26/2013  Discharge Date: 09/28/2013  Physician: Chuck Hint, MD  Admission Diagnosis: Atherosclerosis of native arteries of the extremities with ulceration   HPI:   This is a 63 y.o. male  male who Dr. Edilia Bo last saw on 08/23/2013. At that point he developed a left great toe wound from a blister. He was being followed by Dr. Tanda Rockers at the wound care center. He has undergone previous left external iliac artery angioplasty and stenting in 2011. In addition, he had a right common femoral artery endarterectomy with bovine pericardial patch angioplasty and extensive stenting of the iliac system on the right by Dr. Myra Gianotti. 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 Arbie Cookey. He has been having some rest pain in the left foot and claudication symptoms on the left. He denies fever or chills.  Hospital Course:  The patient was admitted to the hospital and taken to the operating room on 09/26/2013 and underwent: Left femoral to below knee popliteal artery bypass graft with 6 mm Propaten PTFE and intraoperative arteriogram. The pt tolerated the procedure well and was transported to the PACU in good condition. He later went into atrial flutter. Cardiology was consulted. He was placed on Coumadin back in December for rapid atrial fibrillation.    On POD 1, he was doing well and had brisk doppler signals of his left foot. His left great toe wound had no significant drainage with mild cellulitis. He was put on oral levaquin in addition to keflex he was previously on. He was restarted on coumadin. Lovenox was started for DVT prophylaxis. He had been ambulating with a darco shoe.  On POD 2, he continued to ambulate well and continued to have good doppler signals of the left foot. He was discharged home  with a lovenox bridge. He was given extensive instructions on lovenox administration and INR checks at home. He has a home INR machine. He understood the INR therapeutic goal of 2-3. He was discharged with po levaquin and cephalexin. He was told to continue his plavix and discontinue his aspirin. Home health will assist with his wound care. He will follow-up with Dr. Edilia Bo in 2 weeks. He was discharged home in good condition with pain adequately controlled.    CBC    Component Value Date/Time   WBC 8.0 09/27/2013 0340   RBC 3.32* 09/27/2013 0340   HGB 7.2* 09/27/2013 0340   HCT 25.3* 09/27/2013 0340   PLT 220 09/27/2013 0340   MCV 76.2* 09/27/2013 0340   MCH 21.7* 09/27/2013 0340   MCHC 28.5* 09/27/2013 0340   RDW 20.3* 09/27/2013 0340   LYMPHSABS 1.0 03/20/2013 0300   MONOABS 1.1* 03/20/2013 0300   EOSABS 0.1 03/20/2013 0300   BASOSABS 0.0 03/20/2013 0300    BMET    Component Value Date/Time   NA 138 09/27/2013 0340   K 4.5 09/27/2013 0340   CL 101 09/27/2013 0340   CO2 26 09/27/2013 0340   GLUCOSE 310* 09/27/2013 0340   BUN 7 09/27/2013 0340   CREATININE 0.71 09/27/2013 0340   CALCIUM 8.5 09/27/2013 0340   GFRNONAA >90 09/27/2013 0340   GFRAA >90 09/27/2013 0340     Discharge Instructions:   The patient is discharged to home with extensive instructions on wound care and progressive ambulation.  They are instructed not to drive or  perform any heavy lifting until returning to see the physician in his office.  Discharge Instructions   Call MD for:  redness, tenderness, or signs of infection (pain, swelling, bleeding, redness, odor or green/yellow discharge around incision site)    Complete by:  As directed      Call MD for:  severe or increased pain, loss or decreased feeling  in affected limb(s)    Complete by:  As directed      Call MD for:  temperature >100.5    Complete by:  As directed      Change dressing (specify)    Complete by:  As directed   Dressing change: 1 time per day.  1. Keep a moist  dressing on right groin and dry dressing on top. 2.  Keep left groin clean and dry.  3. For left big toe, put hydrogel and wet gauze in wound and place dry gauze on top. Wrap with gauze and ace bandage.     Discharge instructions    Complete by:  As directed   Please do not take your daily aspirin. Continue taking your plavix. Take coumadin 6 mg every day. Please inject lovenox every twelve hours. Please check your INR every day. Your INR goal is 2-3. Discontinue the lovenox when your INR has reached the goal (usually 4-5 days). I have included an extra refill for lovenox in case it takes longer than 5 days to reach the INR goal. Continue taking your antibiotic levaquin until you run out.     Driving Restrictions    Complete by:  As directed   No driving while on pain medication.     Increase activity slowly    Complete by:  As directed   Walk with assistance use walker or cane as needed     Lifting restrictions    Complete by:  As directed   No lifting for 4 weeks     Resume previous diet    Complete by:  As directed            Discharge Diagnosis:  Atherosclerosis of native arteries of the extremities with ulceration  Secondary Diagnosis: Patient Active Problem List   Diagnosis Date Noted  . PAD (peripheral artery disease) 09/26/2013  . Left foot pain 09/20/2013  . Atherosclerosis of native arteries of the extremities with ulceration(440.23) 08/23/2013  . Drainage from wound-Right groin area 05/24/2013  . Aftercare following surgery of the circulatory system, NEC 05/10/2013  . Nausea alone 05/01/2013  . Chronic systolic heart failure 04/30/2013  . Essential hypertension, benign 04/30/2013  . Rheumatoid arthritis 04/30/2013  . Chronic steroid use 04/30/2013  . Debility 04/30/2013  . GERD (gastroesophageal reflux disease) 04/30/2013  . Other and unspecified hyperlipidemia 04/30/2013  . Septic shock 04/26/2013  . Warfarin anticoagulation 03/24/2013  . Cardiomyopathy,  ischemic 03/24/2013  . ICD (implantable cardioverter-defibrillator) in place 03/24/2013  . Atrial flutter 03/17/2013  . Shock 03/16/2013  . Acute respiratory failure 03/15/2013  . Bradycardia 03/14/2013  . Hypotension, unspecified 03/14/2013  . Atrial fibrillation 03/13/2013  . Acute on chronic systolic CHF (congestive heart failure) 03/13/2013  . Diabetes mellitus type 2 with peripheral artery disease 03/13/2013  . CAD (coronary artery disease) 03/13/2013  . S/P CABG x 5 03/13/2013  . Atrial fibrillation, rapid 03/13/2013  . Peripheral vascular disease, unspecified 03/09/2012   Past Medical History  Diagnosis Date  . Diabetes mellitus   . Hypertension   . Hyperlipidemia   . Rheumatoid arthritis   .  CAD (coronary artery disease)   . Myocardial infarction 2000 & 2006  . GERD (gastroesophageal reflux disease)   . Leg pain   . CHF (congestive heart failure)   . PAD (peripheral artery disease)   . Decubitus ulcer of right ankle   . 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 - single chamber ICD  . Shortness of breath     walking  . History of blood transfusion   . Chronic kidney disease     stage 2 per Dr Hezzie Bump notes.       Medication List    STOP taking these medications       aspirin EC 81 MG tablet      TAKE these medications       CALCIUM 1000 + D PO  Take 1 tablet by mouth daily.     carvedilol 25 MG tablet  Commonly known as:  COREG  Take 25 mg by mouth 2 (two) times daily with a meal.     cephALEXin 500 MG capsule  Commonly known as:  KEFLEX  Take 1 capsule (500 mg total) by mouth 3 (three) times daily.     clopidogrel 75 MG tablet  Commonly known as:  PLAVIX  Take 75 mg by mouth daily.     digoxin 0.125 MG tablet  Commonly known as:  LANOXIN  Take 1 tablet (0.125 mg total) by mouth daily.     enoxaparin 100 MG/ML injection  Commonly known as:  LOVENOX  Inject 0.9 mLs (90 mg total) into the skin every 12  (twelve) hours.     ferrous gluconate 325 MG tablet  Commonly known as:  FERGON  Take 325 mg by mouth daily with breakfast.     fish oil-omega-3 fatty acids 1000 MG capsule  Take 2 g by mouth daily.     furosemide 40 MG tablet  Commonly known as:  LASIX  Take 1 tablet (40 mg total) by mouth daily.     gabapentin 300 MG capsule  Commonly known as:  NEURONTIN  Take 600 mg by mouth 3 (three) times daily.     glyBURIDE-metformin 5-500 MG per tablet  Commonly known as:  GLUCOVANCE  Take 1-2 tablets by mouth daily with breakfast. 2 Tabs am and 1 Tab at bedtime     HYDROcodone-acetaminophen 10-325 MG per tablet  Commonly known as:  NORCO  Take 1 tablet by mouth every 6 (six) hours as needed.     LANTUS SOLOSTAR 100 UNIT/ML Solostar Pen  Generic drug:  Insulin Glargine  Inject 10 Units into the skin daily.     leflunomide 20 MG tablet  Commonly known as:  ARAVA  Take 20 mg by mouth daily.     levofloxacin 500 MG tablet  Commonly known as:  LEVAQUIN  Take 1 tablet (500 mg total) by mouth daily.     losartan 25 MG tablet  Commonly known as:  COZAAR  Take 1 tablet (25 mg total) by mouth daily.     magnesium oxide 400 MG tablet  Commonly known as:  MAG-OX  Take 400 mg by mouth daily.     nitroGLYCERIN 0.4 MG SL tablet  Commonly known as:  NITROSTAT  Place 1 tablet (0.4 mg total) under the tongue every 5 (five) minutes x 3 doses as needed for chest pain.     oxyCODONE-acetaminophen 5-325 MG per tablet  Commonly known as:  PERCOCET/ROXICET  Take 1-2 tablets by mouth every 6 (six)  hours as needed for moderate pain.     pantoprazole 40 MG tablet  Commonly known as:  PROTONIX  Take 1 tablet (40 mg total) by mouth 2 (two) times daily.     potassium chloride SA 20 MEQ tablet  Commonly known as:  K-DUR,KLOR-CON  Take 20 mEq by mouth daily.     predniSONE 5 MG tablet  Commonly known as:  DELTASONE  Take 5 mg by mouth 3 (three) times daily.     sitaGLIPtin 100 MG tablet    Commonly known as:  JANUVIA  Take 100 mg by mouth at bedtime.     warfarin 6 MG tablet  Commonly known as:  COUMADIN  Take 1 tablet (6 mg total) by mouth daily.        Prescriptions: Percocet #30 No Refill; Levaquin 500 mg daily # 10 No Refill; Cephalexin 500 mg tid x 14 days; Coumadin 6 mg po daily 1 Refill; Lovenox 70mL q 12 bid # 5, 1 Refill   Disposition: Home with home health   Patient's condition: is Good  Follow up: 1. Dr. Edilia Bo  in 2 weeks   Maris Berger, PA-C [facility] [phone] 09/28/2013  10:53 AM  - For VQI Registry use --- Instructions: Press F2 to tab through selections.  Delete question if not applicable.   Post-op:  Wound infection: No  Graft infection: No  Transfusion: No  If yes,  units given New Arrhythmia: No Ipsilateral amputation: No, [ ]  Minor, [ ]  BKA, [ ]  AKA Discharge patency: [ x] Primary, [ ]  Primary assisted, [ ]  Secondary, [ ]  Occluded Patency judged by: [x ] Dopper only, [ ]  Palpable graft pulse, [ ]  Palpable distal pulse, [ ]  ABI inc. > 0.15, [ ]  Duplex D/C Ambulatory Status: Ambulatory with Assistance  Complications:  MI: No, [ ]  Troponin only, [ ]  EKG or Clinical CHF: No Resp failure:No, [ ]  Pneumonia, [ ]  Ventilator Chg in renal function: No, [ ]  Inc. Cr > 0.5, [ ]  Temp. Dialysis, [ ]  Permanent dialysis Stroke: No, [ ]  Minor, [ ]  Major Return to OR: No  Reason for return to OR: [ ]  Bleeding, [ ]  Infection, [ ]  Thrombosis, [ ]  Revision  Discharge medications: Statin use:  No; patient has adverse effects to statins ASA use:  yes Plavix use:  yes Beta blocker use: yes Coumadin use: yes

## 2013-09-28 NOTE — Telephone Encounter (Addendum)
Message copied by Fredrich Birks on Thu Sep 28, 2013 11:53 AM ------      Message from: Lorin Mercy K      Created: Thu Sep 28, 2013  9:41 AM      Regarding: Schedule                   ----- Message -----         From: Raymond Gurney, PA-C         Sent: 09/28/2013   8:17 AM           To: [facility] Charge Pool            S/p left femoral to below knee popliteal artery bypass graft with 6 mm Propaten PTFE and intraoperative arteriogram 09/26/13            F/u with Dr. Edilia Bo in 2 weeks.             Thanks,       Kim ------  09/28/13: lm for pt re appt, dpm

## 2013-09-28 NOTE — Discharge Summary (Signed)
Agree with plans for D/C.  Waverly Ferrari, MD, FACS Beeper [phone] 09/28/2013

## 2013-09-28 NOTE — Progress Notes (Signed)
Patient instructed this am on self administration of Lovenox at home. States has been inecting own insulin for years. I  Instructed regarding expelling some if syringe given to patient from Pharmacy is 100mg . Dose is 90mg . Patient instructed to swab area with alcohol, pinch and inch of SQ tissue 1-2 inches away from umbilical area. Instructed on engaging the safety of the syringe after injection. Patient verbalized understanding with teach back and demonstrated proper technique with self administration of this am's dose.   , RN

## 2013-09-28 NOTE — Progress Notes (Signed)
Right groin dressing changed, wet to dry, pt tolerated well, no drainage Archie Balboa, RN

## 2013-09-28 NOTE — Progress Notes (Signed)
PT Cancellation Note  Patient Details Name: [name] [name] MRN: [id number] DOB: [birth year]   Cancelled Treatment:    Reason Eval/Treat Not Completed: Other (comment) (Pt to d/c per family and all needs addressed.)   [name] [name],[name] [name] 09/28/2013, 11:58 AM  [facility] [phone] (pager)

## 2013-09-28 NOTE — Progress Notes (Signed)
Left toe dressing change preformed by Dr. Adele Schilder, Annamary Rummage, RN

## 2013-09-28 NOTE — Progress Notes (Addendum)
  [facility] Progress Note  09/28/2013 7:38 AM 2 Days Post-Op  Subjective:  Patient is doing well this morning. Has been ambulating with a darco shoe. Is ready to go home.   Tmax 98.1 BP sys 100s-150s 02 98% RA  Filed Vitals:   09/28/13 0453  BP: 148/80  Pulse: 81  Temp: 97.8 F (36.6 C)  Resp: 18   Physical Exam: Incisions:  Left groin incision clean dry and intact. Left great toe with open wound and mild cellulitis. No active drainage. Right groin dressed Extremities:  +doppler signals left foot   CBC    Component Value Date/Time   WBC 8.0 09/27/2013 0340   RBC 3.32* 09/27/2013 0340   HGB 7.2* 09/27/2013 0340   HCT 25.3* 09/27/2013 0340   PLT 220 09/27/2013 0340   MCV 76.2* 09/27/2013 0340   MCH 21.7* 09/27/2013 0340   MCHC 28.5* 09/27/2013 0340   RDW 20.3* 09/27/2013 0340   LYMPHSABS 1.0 03/20/2013 0300   MONOABS 1.1* 03/20/2013 0300   EOSABS 0.1 03/20/2013 0300   BASOSABS 0.0 03/20/2013 0300   BMET    Component Value Date/Time   NA 138 09/27/2013 0340   K 4.5 09/27/2013 0340   CL 101 09/27/2013 0340   CO2 26 09/27/2013 0340   GLUCOSE 310* 09/27/2013 0340   BUN 7 09/27/2013 0340   CREATININE 0.71 09/27/2013 0340   CALCIUM 8.5 09/27/2013 0340   GFRNONAA >90 09/27/2013 0340   GFRAA >90 09/27/2013 0340   INR    Component Value Date/Time   INR 1.12 09/28/2013 0345    Intake/Output Summary (Last 24 hours) at 09/28/13 0738 Last data filed at 09/27/13 2300  Gross per 24 hour  Intake    340 ml  Output    750 ml  Net   -410 ml   Assessment:  63 y.o. male is s/p:  Left femoral to below knee popliteal artery bypass graft with 26mm Porpaten PTFE and intraoperative arteriogram  2 Days Post-Op  Plan: -Will discharge home today on coumadin and lovenox bridge. Patient will continue plavix but discontinue aspirin per cardiology. Patient to check INR daily with home INR machine. INR goal 2-3. Patient given extensive instructions on discharge medications including lovenox  administration.  -Discharge home on levaquin for cellulitis of left toe.  -Educated patient on home wound care. Wet to dry dressings to right groin and hydrogel to left great toe. Keep left groin clean and dry.  -Continue ambulation with Darco shoe.    Maris Berger, PA-C [facility] Office: [phone] Pager: [phone] 09/28/2013 7:38 AM  Agree with above. Home today.  Waverly Ferrari, MD, FACS Beeper [phone] 09/28/2013   Per pharmacy, make check INR in 4 days.   Maris Berger, PA-C

## 2013-09-28 NOTE — Progress Notes (Signed)
Discharge education, medication, and follow-up appt discussed with pt and family, all stated understanding and that they had no questions, instructions for lovenox administration and dressing changes discussed, pt stated understanding, prescription for pain medicine given to pt, walker at bedside, home health set up, IV and tele removed Archie Balboa, RN

## 2013-10-11 ENCOUNTER — Encounter: Payer: Medicare Other | Admitting: Vascular Surgery

## 2013-10-11 ENCOUNTER — Encounter: Payer: Self-pay | Admitting: Vascular Surgery

## 2013-10-12 ENCOUNTER — Encounter (INDEPENDENT_AMBULATORY_CARE_PROVIDER_SITE_OTHER): Payer: Self-pay

## 2013-10-12 ENCOUNTER — Ambulatory Visit (INDEPENDENT_AMBULATORY_CARE_PROVIDER_SITE_OTHER): Payer: Self-pay | Admitting: Vascular Surgery

## 2013-10-12 ENCOUNTER — Encounter: Payer: Self-pay | Admitting: Vascular Surgery

## 2013-10-12 VITALS — BP 148/66 | HR 83 | Resp 16 | Ht 69.0 in | Wt 188.0 lb

## 2013-10-12 DIAGNOSIS — I739 Peripheral vascular disease, unspecified: Secondary | ICD-10-CM

## 2013-10-12 DIAGNOSIS — L98499 Non-pressure chronic ulcer of skin of other sites with unspecified severity: Secondary | ICD-10-CM

## 2013-10-12 DIAGNOSIS — M79609 Pain in unspecified limb: Secondary | ICD-10-CM

## 2013-10-12 DIAGNOSIS — M79672 Pain in left foot: Secondary | ICD-10-CM

## 2013-10-12 DIAGNOSIS — Z48812 Encounter for surgical aftercare following surgery on the circulatory system: Secondary | ICD-10-CM

## 2013-10-12 MED ORDER — OXYCODONE-ACETAMINOPHEN 5-325 MG PO TABS: 1.0000 | ORAL_TABLET | ORAL | Status: AC | PRN

## 2013-10-12 NOTE — Addendum Note (Signed)
Addended by: Sharee Pimple on: 10/12/2013 02:44 PM   Modules accepted: Orders

## 2013-10-12 NOTE — Progress Notes (Signed)
   Patient name: [name] [name] MRN: [id number] DOB: 03-11-1951 Sex: male  REASON FOR VISIT: Follow up after left femoral to below knee popliteal artery bypass  HPI: [name] [name] is a 63 y.o. male with a nonhealing wound on his left great toe. His arteriogram preoperatively showed a superficial femoral artery occlusion with reconstitution of the below-knee popliteal artery. On 09/26/2013 he underwent a left femoral to below knee popliteal artery bypass with a 6 mm PTFE graft. I had examined the remaining segment of vein in the left leg and also the greater saphenous vein on the right and did not think there was enough adequate vein for an autogenous bypass. He comes in for a two-week follow up visit.  He has no specific complaints except for some burning pain his incision. He denies fever or chills. His wife has been doing dressing changes on his toe.  REVIEW OF SYSTEMS: Arly.Keller ] denotes positive finding; [  ] denotes negative finding  CARDIOVASCULAR:  [ ]  chest pain   [ ]  dyspnea on exertion    CONSTITUTIONAL:  [ ]  fever   [ ]  chills  PHYSICAL EXAM: Filed Vitals:   10/12/13 1246  BP: 148/66  Pulse: 83  Resp: 16  Height: 5\' 9"  (1.753 m)  Weight: 188 lb (85.276 kg)   Body mass index is 27.75 kg/(m^2). GENERAL: The patient is a well-nourished male, in no acute distress. The vital signs are documented above. CARDIOVASCULAR: There is a regular rate and rhythm. PULMONARY: There is good air exchange bilaterally without wheezing or rales. He has a palpable femoral and popliteal pulse on the left and also a dorsalis pedis pulse. His incisions are healing nicely. There is no change in the open wound in the left great toe which I believe goes down to the joint. There is no significant erythema or drainage.  MEDICAL ISSUES: His bypass graft is patent. I have again explained that I do not think that there is any chance of the toe healing given that it extends down to the bone. However he  is very reluctant to consider toe amputation and given that there is no evidence of uncontrolled infection or sepsis will continue to fall this until he is agreeable to proceed with toe amputation his wife will continue his dressing changes. I'll see him back in 3 months with follow up ABIs. He knows to call sooner if there is any change in his toe.  Kathline Banbury S [facility] [place] Beeper: [phone]

## 2013-10-16 ENCOUNTER — Encounter: Payer: Self-pay | Admitting: Vascular Surgery

## 2014-01-09 ENCOUNTER — Encounter: Payer: Self-pay | Admitting: Vascular Surgery

## 2014-01-10 ENCOUNTER — Encounter: Payer: Self-pay | Admitting: Vascular Surgery

## 2014-01-10 ENCOUNTER — Ambulatory Visit (HOSPITAL_COMMUNITY)
Admission: RE | Admit: 2014-01-10 | Discharge: 2014-01-10 | Disposition: A | Discharge status: Home / Self Care | Payer: Medicare Other | Source: Ambulatory Visit | Attending: Vascular Surgery | Admitting: Vascular Surgery

## 2014-01-10 ENCOUNTER — Ambulatory Visit (INDEPENDENT_AMBULATORY_CARE_PROVIDER_SITE_OTHER): Payer: Medicare Other | Admitting: Vascular Surgery

## 2014-01-10 ENCOUNTER — Ambulatory Visit (INDEPENDENT_AMBULATORY_CARE_PROVIDER_SITE_OTHER)
Admission: RE | Admit: 2014-01-10 | Discharge: 2014-01-10 | Disposition: A | Discharge status: Home / Self Care | Payer: Medicare Other | Source: Ambulatory Visit | Attending: Vascular Surgery | Admitting: Vascular Surgery

## 2014-01-10 VITALS — BP 180/67 | HR 80 | Ht 69.0 in | Wt 185.0 lb

## 2014-01-10 DIAGNOSIS — I739 Peripheral vascular disease, unspecified: Secondary | ICD-10-CM

## 2014-01-10 DIAGNOSIS — I70209 Unspecified atherosclerosis of native arteries of extremities, unspecified extremity: Secondary | ICD-10-CM | POA: Insufficient documentation

## 2014-01-10 DIAGNOSIS — L98499 Non-pressure chronic ulcer of skin of other sites with unspecified severity: Secondary | ICD-10-CM | POA: Diagnosis not present

## 2014-01-10 DIAGNOSIS — Z48812 Encounter for surgical aftercare following surgery on the circulatory system: Secondary | ICD-10-CM | POA: Diagnosis not present

## 2014-01-10 DIAGNOSIS — I70245 Atherosclerosis of native arteries of left leg with ulceration of other part of foot: Secondary | ICD-10-CM

## 2014-01-10 NOTE — Progress Notes (Signed)
     Postoperative Visit Bypass Surgery Date of Surgery: 09/26/2013 Surgeon:Dr. Edilia Bo  History of Present Illness  [name] [name] is a 63 y.o. male who presents for postoperative follow-up for: left Vascular Bypass with 6 mm Pro paten PTFE surgery.  He was last seen in the office 10/12/2013.  Dr. Edilia Bo did not think the left great toe would heal given that the wound was down to the bone.  He was seen by a wound care specialist in [place] and on 11/28/2013 he removed the left great toe.  He has an open wound over the medial 1 st metatarsal area that is still healing.  He and his wife are doing daily wet to dry dressing changes with santyl.  He is currently taking coumadin, keep a close eye on his glucse levels and BP.  Review of Systems  Constitutional: Negative for fever, chills and weight loss.  HENT: Negative for congestion.   Eyes: Negative for blurred vision and double vision.  Respiratory: Negative for cough, hemoptysis and stridor.   Cardiovascular: Negative for chest pain and palpitations.  Gastrointestinal: Negative for nausea, vomiting and abdominal pain.  Genitourinary: Negative for frequency.  Musculoskeletal: Positive for joint pain and myalgias.  Skin: Negative for itching.  Neurological: Negative for dizziness, tremors and headaches.     VASC. LAB Studies: 01/10/2014        ABI: Right 0.56;  Left [place]; triphasic flow        Bypass is open  Physical Examination  Filed Vitals:   01/10/14 1454  BP: 180/67  Pulse: 80    Pt is A&O x 3 Heart RRR Lungs CTA Gait is limp left lower extremity: Incision/s is/are clean,dry.intact, and  clean, dry, intact or healed.  Beefy red base to great toe amputation site wound will minimal yellow eschar. Right LE no open ulcers, skin warm to tocuh    Medical Decision Making  [name] [name] is a 63 y.o. year old male who presents s/p left lower extremity bypass surgery .  Healing left metatarsal wound under the care of  a wound care physician in [place].  The patient's bypass incisions are healed with good resolution of pre-operative symptoms. The patient's surveillance will included ABI and bypass duplex studies which will be  in: 6 months.    Clinic MD: Edilia Bo

## 2014-01-11 NOTE — Addendum Note (Signed)
Addended by: Sharee Pimple on: 01/11/2014 03:55 PM   Modules accepted: Orders

## 2014-03-08 ENCOUNTER — Encounter (HOSPITAL_COMMUNITY): Payer: Self-pay | Admitting: Vascular Surgery

## 2014-04-01 ENCOUNTER — Emergency Department (HOSPITAL_COMMUNITY): Payer: Medicare Other

## 2014-04-01 ENCOUNTER — Inpatient Hospital Stay (HOSPITAL_COMMUNITY)
Admission: EM | Admit: 2014-04-01 | Discharge: 2014-04-30 | DRG: 871 | Disposition: E | Payer: Medicare Other | Attending: Internal Medicine | Admitting: Internal Medicine

## 2014-04-01 ENCOUNTER — Encounter (HOSPITAL_COMMUNITY): Payer: Self-pay | Admitting: Emergency Medicine

## 2014-04-01 ENCOUNTER — Inpatient Hospital Stay (HOSPITAL_COMMUNITY): Payer: Medicare Other

## 2014-04-01 DIAGNOSIS — E872 Acidosis, unspecified: Secondary | ICD-10-CM

## 2014-04-01 DIAGNOSIS — N179 Acute kidney failure, unspecified: Secondary | ICD-10-CM | POA: Diagnosis present

## 2014-04-01 DIAGNOSIS — Z7952 Long term (current) use of systemic steroids: Secondary | ICD-10-CM | POA: Diagnosis not present

## 2014-04-01 DIAGNOSIS — Z87891 Personal history of nicotine dependence: Secondary | ICD-10-CM | POA: Diagnosis not present

## 2014-04-01 DIAGNOSIS — K219 Gastro-esophageal reflux disease without esophagitis: Secondary | ICD-10-CM | POA: Diagnosis present

## 2014-04-01 DIAGNOSIS — R6521 Severe sepsis with septic shock: Secondary | ICD-10-CM | POA: Diagnosis present

## 2014-04-01 DIAGNOSIS — R579 Shock, unspecified: Secondary | ICD-10-CM

## 2014-04-01 DIAGNOSIS — Z66 Do not resuscitate: Secondary | ICD-10-CM

## 2014-04-01 DIAGNOSIS — Z7902 Long term (current) use of antithrombotics/antiplatelets: Secondary | ICD-10-CM | POA: Diagnosis not present

## 2014-04-01 DIAGNOSIS — Z7901 Long term (current) use of anticoagulants: Secondary | ICD-10-CM | POA: Diagnosis not present

## 2014-04-01 DIAGNOSIS — Z515 Encounter for palliative care: Secondary | ICD-10-CM

## 2014-04-01 DIAGNOSIS — D638 Anemia in other chronic diseases classified elsewhere: Secondary | ICD-10-CM | POA: Diagnosis present

## 2014-04-01 DIAGNOSIS — E785 Hyperlipidemia, unspecified: Secondary | ICD-10-CM | POA: Diagnosis present

## 2014-04-01 DIAGNOSIS — I214 Non-ST elevation (NSTEMI) myocardial infarction: Secondary | ICD-10-CM | POA: Diagnosis present

## 2014-04-01 DIAGNOSIS — A4101 Sepsis due to Methicillin susceptible Staphylococcus aureus: Secondary | ICD-10-CM | POA: Diagnosis present

## 2014-04-01 DIAGNOSIS — I4891 Unspecified atrial fibrillation: Secondary | ICD-10-CM | POA: Diagnosis not present

## 2014-04-01 DIAGNOSIS — M25552 Pain in left hip: Secondary | ICD-10-CM | POA: Diagnosis present

## 2014-04-01 DIAGNOSIS — Z833 Family history of diabetes mellitus: Secondary | ICD-10-CM | POA: Diagnosis not present

## 2014-04-01 DIAGNOSIS — Z8249 Family history of ischemic heart disease and other diseases of the circulatory system: Secondary | ICD-10-CM

## 2014-04-01 DIAGNOSIS — Z4659 Encounter for fitting and adjustment of other gastrointestinal appliance and device: Secondary | ICD-10-CM

## 2014-04-01 DIAGNOSIS — I5022 Chronic systolic (congestive) heart failure: Secondary | ICD-10-CM | POA: Diagnosis present

## 2014-04-01 DIAGNOSIS — M069 Rheumatoid arthritis, unspecified: Secondary | ICD-10-CM | POA: Diagnosis present

## 2014-04-01 DIAGNOSIS — Z79899 Other long term (current) drug therapy: Secondary | ICD-10-CM | POA: Diagnosis not present

## 2014-04-01 DIAGNOSIS — Z794 Long term (current) use of insulin: Secondary | ICD-10-CM | POA: Diagnosis not present

## 2014-04-01 DIAGNOSIS — Z8701 Personal history of pneumonia (recurrent): Secondary | ICD-10-CM

## 2014-04-01 DIAGNOSIS — Z955 Presence of coronary angioplasty implant and graft: Secondary | ICD-10-CM

## 2014-04-01 DIAGNOSIS — Z951 Presence of aortocoronary bypass graft: Secondary | ICD-10-CM

## 2014-04-01 DIAGNOSIS — I129 Hypertensive chronic kidney disease with stage 1 through stage 4 chronic kidney disease, or unspecified chronic kidney disease: Secondary | ICD-10-CM | POA: Diagnosis present

## 2014-04-01 DIAGNOSIS — E119 Type 2 diabetes mellitus without complications: Secondary | ICD-10-CM | POA: Diagnosis present

## 2014-04-01 DIAGNOSIS — J189 Pneumonia, unspecified organism: Secondary | ICD-10-CM | POA: Diagnosis present

## 2014-04-01 DIAGNOSIS — I252 Old myocardial infarction: Secondary | ICD-10-CM

## 2014-04-01 DIAGNOSIS — M79651 Pain in right thigh: Secondary | ICD-10-CM

## 2014-04-01 DIAGNOSIS — I255 Ischemic cardiomyopathy: Secondary | ICD-10-CM | POA: Diagnosis present

## 2014-04-01 DIAGNOSIS — A419 Sepsis, unspecified organism: Secondary | ICD-10-CM

## 2014-04-01 DIAGNOSIS — J9600 Acute respiratory failure, unspecified whether with hypoxia or hypercapnia: Secondary | ICD-10-CM | POA: Diagnosis present

## 2014-04-01 DIAGNOSIS — G8929 Other chronic pain: Secondary | ICD-10-CM | POA: Diagnosis present

## 2014-04-01 DIAGNOSIS — L03116 Cellulitis of left lower limb: Secondary | ICD-10-CM

## 2014-04-01 DIAGNOSIS — N182 Chronic kidney disease, stage 2 (mild): Secondary | ICD-10-CM | POA: Diagnosis present

## 2014-04-01 DIAGNOSIS — R209 Unspecified disturbances of skin sensation: Secondary | ICD-10-CM

## 2014-04-01 DIAGNOSIS — I739 Peripheral vascular disease, unspecified: Secondary | ICD-10-CM | POA: Diagnosis present

## 2014-04-01 DIAGNOSIS — Z79891 Long term (current) use of opiate analgesic: Secondary | ICD-10-CM | POA: Diagnosis not present

## 2014-04-01 DIAGNOSIS — Z978 Presence of other specified devices: Secondary | ICD-10-CM

## 2014-04-01 DIAGNOSIS — I2510 Atherosclerotic heart disease of native coronary artery without angina pectoris: Secondary | ICD-10-CM | POA: Diagnosis present

## 2014-04-01 DIAGNOSIS — Z95810 Presence of automatic (implantable) cardiac defibrillator: Secondary | ICD-10-CM

## 2014-04-01 DIAGNOSIS — M25559 Pain in unspecified hip: Secondary | ICD-10-CM

## 2014-04-01 DIAGNOSIS — T827XXA Infection and inflammatory reaction due to other cardiac and vascular devices, implants and grafts, initial encounter: Secondary | ICD-10-CM

## 2014-04-01 DIAGNOSIS — T80212A Local infection due to central venous catheter, initial encounter: Secondary | ICD-10-CM

## 2014-04-01 DIAGNOSIS — M79652 Pain in left thigh: Secondary | ICD-10-CM

## 2014-04-01 DIAGNOSIS — J9601 Acute respiratory failure with hypoxia: Secondary | ICD-10-CM

## 2014-04-01 DIAGNOSIS — M549 Dorsalgia, unspecified: Secondary | ICD-10-CM

## 2014-04-01 DIAGNOSIS — R208 Other disturbances of skin sensation: Secondary | ICD-10-CM

## 2014-04-01 HISTORY — DX: Sepsis, unspecified organism: A41.9

## 2014-04-01 LAB — URINALYSIS, ROUTINE W REFLEX MICROSCOPIC
BILIRUBIN URINE: NEGATIVE
Glucose, UA: NEGATIVE mg/dL
Hgb urine dipstick: NEGATIVE
Ketones, ur: NEGATIVE mg/dL
LEUKOCYTES UA: NEGATIVE
NITRITE: NEGATIVE
Protein, ur: 30 mg/dL — AB
Specific Gravity, Urine: 1.017 (ref 1.005–1.030)
Urobilinogen, UA: 0.2 mg/dL (ref 0.0–1.0)
pH: 5.5 (ref 5.0–8.0)

## 2014-04-01 LAB — BASIC METABOLIC PANEL
ANION GAP: 16 — AB (ref 5–15)
BUN: 18 mg/dL (ref 6–23)
CHLORIDE: 101 meq/L (ref 96–112)
CO2: 11 mmol/L — ABNORMAL LOW (ref 19–32)
CREATININE: 2.31 mg/dL — AB (ref 0.50–1.35)
Calcium: 7.5 mg/dL — ABNORMAL LOW (ref 8.4–10.5)
GFR calc Af Amer: 33 mL/min — ABNORMAL LOW (ref >90)
GFR, EST NON AFRICAN AMERICAN: 29 mL/min — AB (ref >90)
GLUCOSE: 204 mg/dL — AB (ref 70–99)
Potassium: 6.8 mmol/L (ref 3.5–5.1)
Sodium: 128 mmol/L — ABNORMAL LOW (ref 135–145)

## 2014-04-01 LAB — CBC WITH DIFFERENTIAL/PLATELET
BASOS ABS: 0 10*3/uL (ref 0.0–0.1)
BASOS PCT: 0 % (ref 0–1)
Basophils Absolute: 0 10*3/uL (ref 0.0–0.1)
Basophils Relative: 0 % (ref 0–1)
EOS PCT: 0 % (ref 0–5)
Eosinophils Absolute: 0 10*3/uL (ref 0.0–0.7)
Eosinophils Absolute: 0.1 10*3/uL (ref 0.0–0.7)
Eosinophils Relative: 0 % (ref 0–5)
HCT: 32.8 % — ABNORMAL LOW (ref 39.0–52.0)
HCT: 28 % — ABNORMAL LOW (ref 39.0–52.0)
HEMOGLOBIN: 8.3 g/dL — AB (ref 13.0–17.0)
Hemoglobin: 9.6 g/dL — ABNORMAL LOW (ref 13.0–17.0)
LYMPHS PCT: 11 % — AB (ref 12–46)
Lymphocytes Relative: 13 % (ref 12–46)
Lymphs Abs: 1.4 10*3/uL (ref 0.7–4.0)
Lymphs Abs: 2.2 10*3/uL (ref 0.7–4.0)
MCH: 22.2 pg — ABNORMAL LOW (ref 26.0–34.0)
MCH: 22.8 pg — ABNORMAL LOW (ref 26.0–34.0)
MCHC: 29.3 g/dL — ABNORMAL LOW (ref 30.0–36.0)
MCHC: 29.6 g/dL — ABNORMAL LOW (ref 30.0–36.0)
MCV: 75.8 fL — ABNORMAL LOW (ref 78.0–100.0)
MCV: 76.9 fL — ABNORMAL LOW (ref 78.0–100.0)
MONO ABS: 0.8 10*3/uL (ref 0.1–1.0)
Monocytes Absolute: 0.8 10*3/uL (ref 0.1–1.0)
Monocytes Relative: 6 % (ref 3–12)
Monocytes Relative: 5 % (ref 3–12)
NEUTROS PCT: 82 % — AB (ref 43–77)
Neutro Abs: 10.3 10*3/uL — ABNORMAL HIGH (ref 1.7–7.7)
Neutro Abs: 14 10*3/uL — ABNORMAL HIGH (ref 1.7–7.7)
Neutrophils Relative %: 83 % — ABNORMAL HIGH (ref 43–77)
PLATELETS: 207 10*3/uL (ref 150–400)
Platelets: 213 10*3/uL (ref 150–400)
RBC: 4.33 MIL/uL (ref 4.22–5.81)
RBC: 3.64 MIL/uL — AB (ref 4.22–5.81)
RDW: 19 % — AB (ref 11.5–15.5)
RDW: 19.1 % — ABNORMAL HIGH (ref 11.5–15.5)
WBC: 12.5 10*3/uL — AB (ref 4.0–10.5)
WBC: 17.2 10*3/uL — AB (ref 4.0–10.5)

## 2014-04-01 LAB — COMPREHENSIVE METABOLIC PANEL
ALK PHOS: 106 U/L (ref 39–117)
ALT: 20 U/L (ref 0–53)
ALT: 34 U/L (ref 0–53)
AST: 25 U/L (ref 0–37)
AST: 51 U/L — AB (ref 0–37)
Albumin: 3.1 g/dL — ABNORMAL LOW (ref 3.5–5.2)
Albumin: 2.4 g/dL — ABNORMAL LOW (ref 3.5–5.2)
Alkaline Phosphatase: 90 U/L (ref 39–117)
Anion gap: 11 (ref 5–15)
Anion gap: 11 (ref 5–15)
BILIRUBIN TOTAL: 0.8 mg/dL (ref 0.3–1.2)
BUN: 12 mg/dL (ref 6–23)
BUN: 15 mg/dL (ref 6–23)
CALCIUM: 7.8 mg/dL — AB (ref 8.4–10.5)
CHLORIDE: 103 meq/L (ref 96–112)
CHLORIDE: 107 meq/L (ref 96–112)
CO2: 23 mmol/L (ref 19–32)
CO2: 20 mmol/L (ref 19–32)
CREATININE: 1.08 mg/dL (ref 0.50–1.35)
CREATININE: 1.74 mg/dL — AB (ref 0.50–1.35)
Calcium: 9 mg/dL (ref 8.4–10.5)
GFR calc Af Amer: 84 mL/min — ABNORMAL LOW (ref >90)
GFR calc Af Amer: 47 mL/min — ABNORMAL LOW (ref >90)
GFR calc non Af Amer: 72 mL/min — ABNORMAL LOW (ref >90)
GFR calc non Af Amer: 41 mL/min — ABNORMAL LOW (ref >90)
GLUCOSE: 109 mg/dL — AB (ref 70–99)
GLUCOSE: 115 mg/dL — AB (ref 70–99)
Potassium: 3.4 mmol/L — ABNORMAL LOW (ref 3.5–5.1)
Potassium: 3.5 mmol/L (ref 3.5–5.1)
SODIUM: 137 mmol/L (ref 135–145)
SODIUM: 138 mmol/L (ref 135–145)
Total Bilirubin: 1.4 mg/dL — ABNORMAL HIGH (ref 0.3–1.2)
Total Protein: 6.6 g/dL (ref 6.0–8.3)
Total Protein: 5.5 g/dL — ABNORMAL LOW (ref 6.0–8.3)

## 2014-04-01 LAB — CARBOXYHEMOGLOBIN
CARBOXYHEMOGLOBIN: 1.6 % — AB (ref 0.5–1.5)
Methemoglobin: 0.9 % (ref 0.0–1.5)
O2 Saturation: 63.5 %
TOTAL HEMOGLOBIN: 8.6 g/dL — AB (ref 13.5–18.0)

## 2014-04-01 LAB — POCT I-STAT 3, ART BLOOD GAS (G3+)
ACID-BASE DEFICIT: 8 mmol/L — AB (ref 0.0–2.0)
ACID-BASE DEFICIT: 10 mmol/L — AB (ref 0.0–2.0)
Bicarbonate: 19 mEq/L — ABNORMAL LOW (ref 20.0–24.0)
Bicarbonate: 15.4 mEq/L — ABNORMAL LOW (ref 20.0–24.0)
O2 SAT: 54 %
O2 Saturation: 68 %
PH ART: 7.287 — AB (ref 7.350–7.450)
PO2 ART: 34 mmHg — AB (ref 80.0–100.0)
PO2 ART: 40 mmHg — AB (ref 80.0–100.0)
Patient temperature: 99
Patient temperature: 99
TCO2: 20 mmol/L (ref 0–100)
TCO2: 16 mmol/L (ref 0–100)
pCO2 arterial: 46.3 mmHg — ABNORMAL HIGH (ref 35.0–45.0)
pCO2 arterial: 32.3 mmHg — ABNORMAL LOW (ref 35.0–45.0)
pH, Arterial: 7.223 — ABNORMAL LOW (ref 7.350–7.450)

## 2014-04-01 LAB — URINE MICROSCOPIC-ADD ON

## 2014-04-01 LAB — MRSA PCR SCREENING: MRSA by PCR: NEGATIVE

## 2014-04-01 LAB — PROTIME-INR
INR: 1.99 — AB (ref 0.00–1.49)
INR: 2.07 — AB (ref 0.00–1.49)
Prothrombin Time: 22.8 seconds — ABNORMAL HIGH (ref 11.6–15.2)
Prothrombin Time: 23.5 seconds — ABNORMAL HIGH (ref 11.6–15.2)

## 2014-04-01 LAB — TYPE AND SCREEN
ABO/RH(D): A POS
Antibody Screen: NEGATIVE

## 2014-04-01 LAB — I-STAT CG4 LACTIC ACID, ED
LACTIC ACID, VENOUS: 3.55 mmol/L — AB (ref 0.5–2.2)
Lactic Acid, Venous: 3.9 mmol/L — ABNORMAL HIGH (ref 0.5–2.2)

## 2014-04-01 LAB — BRAIN NATRIURETIC PEPTIDE: B Natriuretic Peptide: 2705.4 pg/mL — ABNORMAL HIGH (ref 0.0–100.0)

## 2014-04-01 LAB — MAGNESIUM: Magnesium: 1.2 mg/dL — ABNORMAL LOW (ref 1.5–2.5)

## 2014-04-01 LAB — LACTIC ACID, PLASMA: Lactic Acid, Venous: 3.1 mmol/L — ABNORMAL HIGH (ref 0.5–2.2)

## 2014-04-01 LAB — AMYLASE: Amylase: 21 U/L (ref 0–105)

## 2014-04-01 LAB — STREP PNEUMONIAE URINARY ANTIGEN: Strep Pneumo Urinary Antigen: NEGATIVE

## 2014-04-01 LAB — GLUCOSE, CAPILLARY
GLUCOSE-CAPILLARY: 126 mg/dL — AB (ref 70–99)
Glucose-Capillary: 99 mg/dL (ref 70–99)

## 2014-04-01 LAB — I-STAT TROPONIN, ED: Troponin i, poc: 0.38 ng/mL (ref 0.00–0.08)

## 2014-04-01 LAB — PHOSPHORUS: Phosphorus: 2.6 mg/dL (ref 2.3–4.6)

## 2014-04-01 LAB — D-DIMER, QUANTITATIVE (NOT AT ARMC): D DIMER QUANT: 2.34 ug{FEU}/mL — AB (ref 0.00–0.48)

## 2014-04-01 LAB — LIPASE, BLOOD: Lipase: 23 U/L (ref 11–59)

## 2014-04-01 LAB — APTT: APTT: 30 s (ref 24–37)

## 2014-04-01 LAB — CORTISOL: Cortisol, Plasma: 2.6 ug/dL

## 2014-04-01 LAB — TROPONIN I
TROPONIN I: 4.43 ng/mL — AB (ref <0.031)
TROPONIN I: 10.54 ng/mL — AB (ref <0.031)

## 2014-04-01 MED ORDER — SODIUM CHLORIDE 0.9 % IV SOLN
25.0000 ug/h | INTRAVENOUS | Status: DC
  Administered 2014-04-01: 50 ug/h via INTRAVENOUS
  Filled 2014-04-01: qty 50

## 2014-04-01 MED ORDER — PHENYLEPHRINE HCL 10 MG/ML IJ SOLN
20.0000 ug/min | INTRAVENOUS | Status: DC
  Administered 2014-04-01 (×2): 180 ug/min via INTRAVENOUS
  Filled 2014-04-01 (×3): qty 4

## 2014-04-01 MED ORDER — INSULIN ASPART 100 UNIT/ML ~~LOC~~ SOLN
8.0000 [IU] | Freq: Once | SUBCUTANEOUS | Status: AC
  Administered 2014-04-01: 8 [IU] via INTRAVENOUS

## 2014-04-01 MED ORDER — SODIUM CHLORIDE 0.9 % IV SOLN: Freq: Once | INTRAVENOUS | Status: DC

## 2014-04-01 MED ORDER — HYDROCORTISONE NA SUCCINATE PF 100 MG IJ SOLR
50.0000 mg | Freq: Four times a day (QID) | INTRAMUSCULAR | Status: DC
  Administered 2014-04-01: 50 mg via INTRAVENOUS
  Filled 2014-04-01: qty 2

## 2014-04-01 MED ORDER — CETYLPYRIDINIUM CHLORIDE 0.05 % MT LIQD
7.0000 mL | Freq: Four times a day (QID) | OROMUCOSAL | Status: DC
  Administered 2014-04-01: 7 mL via OROMUCOSAL

## 2014-04-01 MED ORDER — DEXTROSE 5 % IV SOLN
0.0000 ug/min | Freq: Once | INTRAVENOUS | Status: DC
Start: 2014-04-01 — End: 2014-04-01
  Administered 2014-04-01: 45 ug/min via INTRAVENOUS
  Filled 2014-04-01: qty 1

## 2014-04-01 MED ORDER — INSULIN ASPART 100 UNIT/ML ~~LOC~~ SOLN: 2.0000 [IU] | SUBCUTANEOUS | Status: DC

## 2014-04-01 MED ORDER — ONDANSETRON HCL 4 MG/2ML IJ SOLN
INTRAMUSCULAR | Status: AC
  Filled 2014-04-01: qty 2

## 2014-04-01 MED ORDER — NOREPINEPHRINE BITARTRATE 1 MG/ML IV SOLN
5.0000 ug/min | INTRAVENOUS | Status: DC
  Administered 2014-04-01 (×2): 25 ug/min via INTRAVENOUS
  Filled 2014-04-01 (×3): qty 4

## 2014-04-01 MED ORDER — SODIUM CHLORIDE 0.9 % IV BOLUS (SEPSIS)
500.0000 mL | Freq: Once | INTRAVENOUS | Status: AC
  Administered 2014-04-01: 500 mL via INTRAVENOUS

## 2014-04-01 MED ORDER — PANTOPRAZOLE SODIUM 40 MG IV SOLR: 40.0000 mg | Freq: Every day | INTRAVENOUS | Status: DC

## 2014-04-01 MED ORDER — HYDROCORTISONE NA SUCCINATE PF 100 MG IJ SOLR: 50.0000 mg | Freq: Four times a day (QID) | INTRAMUSCULAR | Status: DC

## 2014-04-01 MED ORDER — FENTANYL CITRATE 0.05 MG/ML IJ SOLN: 50.0000 ug | Freq: Once | INTRAMUSCULAR | Status: DC

## 2014-04-01 MED ORDER — SODIUM CHLORIDE 0.9 % IV BOLUS (SEPSIS)
500.0000 mL | INTRAVENOUS | Status: AC
  Administered 2014-04-01: 1000 mL via INTRAVENOUS

## 2014-04-01 MED ORDER — SENNOSIDES 8.8 MG/5ML PO SYRP
5.0000 mL | ORAL_SOLUTION | Freq: Two times a day (BID) | ORAL | Status: DC | PRN
  Filled 2014-04-01: qty 5

## 2014-04-01 MED ORDER — DEXTROSE 50 % IV SOLN
50.0000 mL | Freq: Once | INTRAVENOUS | Status: AC
  Administered 2014-04-01: 50 mL via INTRAVENOUS
  Filled 2014-04-01: qty 50

## 2014-04-01 MED ORDER — MIDAZOLAM HCL 2 MG/2ML IJ SOLN
INTRAMUSCULAR | Status: AC
  Filled 2014-04-01: qty 2

## 2014-04-01 MED ORDER — CLOPIDOGREL BISULFATE 75 MG PO TABS
75.0000 mg | ORAL_TABLET | Freq: Every day | ORAL | Status: DC
  Filled 2014-04-01: qty 1

## 2014-04-01 MED ORDER — VANCOMYCIN HCL IN DEXTROSE 1-5 GM/200ML-% IV SOLN
1000.0000 mg | Freq: Once | INTRAVENOUS | Status: AC
  Administered 2014-04-01: 1000 mg via INTRAVENOUS
  Filled 2014-04-01: qty 200

## 2014-04-01 MED ORDER — VANCOMYCIN HCL IN DEXTROSE 1-5 GM/200ML-% IV SOLN
1000.0000 mg | Freq: Two times a day (BID) | INTRAVENOUS | Status: DC
  Filled 2014-04-01: qty 200

## 2014-04-01 MED ORDER — HYDROMORPHONE HCL 1 MG/ML IJ SOLN
1.0000 mg | Freq: Once | INTRAMUSCULAR | Status: AC
  Administered 2014-04-01: 1 mg via INTRAVENOUS
  Filled 2014-04-01: qty 1

## 2014-04-01 MED ORDER — SODIUM CHLORIDE 0.9 % IV SOLN: 250.0000 mL | INTRAVENOUS | Status: DC | PRN

## 2014-04-01 MED ORDER — MIDAZOLAM HCL 2 MG/2ML IJ SOLN
INTRAMUSCULAR | Status: AC | PRN
  Administered 2014-04-01: 2 mg via INTRAVENOUS

## 2014-04-01 MED ORDER — FENTANYL CITRATE 0.05 MG/ML IJ SOLN
INTRAMUSCULAR | Status: AC
  Filled 2014-04-01: qty 2

## 2014-04-01 MED ORDER — IOHEXOL 300 MG/ML  SOLN
25.0000 mL | INTRAMUSCULAR | Status: AC
  Administered 2014-04-01: 25 mL via ORAL

## 2014-04-01 MED ORDER — CALCIUM CHLORIDE 10 % IV SOLN
1.0000 g | Freq: Once | INTRAVENOUS | Status: AC
  Administered 2014-04-01: 1 g via INTRAVENOUS

## 2014-04-01 MED ORDER — FENTANYL BOLUS VIA INFUSION
50.0000 ug | INTRAVENOUS | Status: DC | PRN
  Administered 2014-04-01: 50 ug via INTRAVENOUS
  Filled 2014-04-01: qty 50

## 2014-04-01 MED ORDER — ACETAMINOPHEN 650 MG RE SUPP: 650.0000 mg | RECTAL | Status: DC | PRN

## 2014-04-01 MED ORDER — SODIUM CHLORIDE 0.9 % IV SOLN
INTRAVENOUS | Status: DC
  Administered 2014-04-01 (×2): via INTRAVENOUS

## 2014-04-01 MED ORDER — SENNOSIDES-DOCUSATE SODIUM 8.6-50 MG PO TABS
1.0000 | ORAL_TABLET | Freq: Two times a day (BID) | ORAL | Status: DC | PRN
  Filled 2014-04-01: qty 1

## 2014-04-01 MED ORDER — HEPARIN (PORCINE) IN NACL 100-0.45 UNIT/ML-% IJ SOLN
1100.0000 [IU]/h | INTRAMUSCULAR | Status: DC
Start: 2014-04-01 — End: 2014-04-01
  Administered 2014-04-01: 1100 [IU]/h via INTRAVENOUS
  Filled 2014-04-01: qty 250

## 2014-04-01 MED ORDER — CHLORHEXIDINE GLUCONATE 0.12 % MT SOLN: 15.0000 mL | Freq: Two times a day (BID) | OROMUCOSAL | Status: DC

## 2014-04-01 MED ORDER — ROCURONIUM BROMIDE 50 MG/5ML IV SOLN
INTRAVENOUS | Status: AC
  Filled 2014-04-01: qty 2

## 2014-04-01 MED ORDER — ACETAMINOPHEN 650 MG RE SUPP
650.0000 mg | Freq: Once | RECTAL | Status: AC
  Administered 2014-04-01: 650 mg via RECTAL
  Filled 2014-04-01: qty 1

## 2014-04-01 MED ORDER — SODIUM CHLORIDE 0.9 % IV SOLN: INTRAVENOUS | Status: DC

## 2014-04-01 MED ORDER — ETOMIDATE 2 MG/ML IV SOLN
INTRAVENOUS | Status: AC | PRN
  Administered 2014-04-01: 20 mg via INTRAVENOUS

## 2014-04-01 MED ORDER — PHENYLEPHRINE HCL 10 MG/ML IJ SOLN
20.0000 ug/min | INTRAVENOUS | Status: DC
  Administered 2014-04-01: 45 ug/min via INTRAVENOUS
  Filled 2014-04-01: qty 1

## 2014-04-01 MED ORDER — CALCIUM GLUCONATE 10 % IV SOLN
1.0000 g | Freq: Once | INTRAVENOUS | Status: DC
Start: 2014-04-01 — End: 2014-04-02
  Filled 2014-04-01: qty 10

## 2014-04-01 MED ORDER — FENTANYL CITRATE 0.05 MG/ML IJ SOLN
INTRAMUSCULAR | Status: AC | PRN
  Administered 2014-04-01: 100 ug via INTRAVENOUS

## 2014-04-01 MED ORDER — SODIUM CHLORIDE 0.9 % IV SOLN
1.0000 mg/h | INTRAVENOUS | Status: DC
  Administered 2014-04-01: 2 mg/h via INTRAVENOUS
  Filled 2014-04-01: qty 5

## 2014-04-01 MED ORDER — ACETAMINOPHEN 650 MG RE SUPP
650.0000 mg | Freq: Once | RECTAL | Status: AC
Start: 2014-04-01 — End: 2014-04-01
  Administered 2014-04-01: 650 mg via RECTAL

## 2014-04-01 MED ORDER — ONDANSETRON HCL 4 MG/2ML IJ SOLN
4.0000 mg | Freq: Once | INTRAMUSCULAR | Status: AC
  Administered 2014-04-01: 4 mg via INTRAVENOUS

## 2014-04-01 MED ORDER — LIDOCAINE HCL (CARDIAC) 20 MG/ML IV SOLN
INTRAVENOUS | Status: AC
  Filled 2014-04-01: qty 5

## 2014-04-01 MED ORDER — SODIUM CHLORIDE 0.9 % IV BOLUS (SEPSIS)
1000.0000 mL | INTRAVENOUS | Status: AC
  Administered 2014-04-01 (×2): 1000 mL via INTRAVENOUS

## 2014-04-01 MED ORDER — ASPIRIN 325 MG PO TABS
325.0000 mg | ORAL_TABLET | Freq: Once | ORAL | Status: AC
  Administered 2014-04-01: 325 mg via ORAL
  Filled 2014-04-01: qty 1

## 2014-04-01 MED ORDER — FENTANYL CITRATE 0.05 MG/ML IJ SOLN
100.0000 ug | Freq: Once | INTRAMUSCULAR | Status: AC
  Administered 2014-04-01: 100 ug via INTRAVENOUS

## 2014-04-01 MED ORDER — MIDAZOLAM HCL 2 MG/2ML IJ SOLN
2.0000 mg | Freq: Once | INTRAMUSCULAR | Status: AC
  Administered 2014-04-01: 2 mg via INTRAVENOUS

## 2014-04-01 MED ORDER — PIPERACILLIN-TAZOBACTAM 3.375 G IVPB
3.3750 g | Freq: Three times a day (TID) | INTRAVENOUS | Status: DC
  Administered 2014-04-01: 3.375 g via INTRAVENOUS
  Filled 2014-04-01 (×2): qty 50

## 2014-04-01 MED ORDER — SODIUM BICARBONATE 8.4 % IV SOLN
50.0000 meq | Freq: Once | INTRAVENOUS | Status: AC
  Administered 2014-04-01: 50 meq via INTRAVENOUS

## 2014-04-01 MED ORDER — VANCOMYCIN HCL IN DEXTROSE 1-5 GM/200ML-% IV SOLN
1000.0000 mg | Freq: Two times a day (BID) | INTRAVENOUS | Status: DC
  Administered 2014-04-01: 1000 mg via INTRAVENOUS
  Filled 2014-04-01 (×2): qty 200

## 2014-04-01 MED ORDER — PIPERACILLIN-TAZOBACTAM 3.375 G IVPB 30 MIN
3.3750 g | Freq: Once | INTRAVENOUS | Status: AC
  Administered 2014-04-01: 3.375 g via INTRAVENOUS
  Filled 2014-04-01: qty 50

## 2014-04-01 MED ORDER — ROCURONIUM BROMIDE 50 MG/5ML IV SOLN
60.0000 mg | Freq: Once | INTRAVENOUS | Status: AC
  Administered 2014-04-01: 60 mg via INTRAVENOUS

## 2014-04-01 MED ORDER — VASOPRESSIN 20 UNIT/ML IV SOLN
0.0300 [IU]/min | INTRAVENOUS | Status: DC
  Administered 2014-04-01: 0.03 [IU]/min via INTRAVENOUS
  Filled 2014-04-01: qty 2

## 2014-04-01 MED ORDER — HYDROCORTISONE NA SUCCINATE PF 100 MG IJ SOLR
100.0000 mg | INTRAMUSCULAR | Status: AC
  Administered 2014-04-01: 100 mg via INTRAVENOUS

## 2014-04-01 MED ORDER — SUCCINYLCHOLINE CHLORIDE 20 MG/ML IJ SOLN
INTRAMUSCULAR | Status: AC
  Filled 2014-04-01: qty 1

## 2014-04-01 MED ORDER — IPRATROPIUM-ALBUTEROL 0.5-2.5 (3) MG/3ML IN SOLN
3.0000 mL | Freq: Four times a day (QID) | RESPIRATORY_TRACT | Status: DC
  Administered 2014-04-01: 3 mL via RESPIRATORY_TRACT
  Filled 2014-04-01: qty 3

## 2014-04-01 MED ORDER — NOREPINEPHRINE BITARTRATE 1 MG/ML IV SOLN
0.0000 ug/min | INTRAVENOUS | Status: AC
  Administered 2014-04-01: 2 ug/min via INTRAVENOUS
  Filled 2014-04-01: qty 4

## 2014-04-01 MED ORDER — DOBUTAMINE IN D5W 4-5 MG/ML-% IV SOLN: 2.5000 ug/kg/min | INTRAVENOUS | Status: DC

## 2014-04-01 MED ORDER — ETOMIDATE 2 MG/ML IV SOLN
INTRAVENOUS | Status: AC
  Filled 2014-04-01: qty 20

## 2014-04-01 NOTE — Progress Notes (Signed)
eLink Physician-Brief Progress Note Patient Name: [name] [name] DOB: 06-15-1950 MRN: [id number]   Date of Service  04/23/2014  HPI/Events of Note  broadened complexes - brady rhythm  eICU Interventions  Given emergent bicarbonate, calcium IV -narrowed complexes again D50/insulin Added BMET to labs sent May need HD if indeed hyperkalemic     Intervention Category Major Interventions: Arrhythmia - evaluation and management Intermediate Interventions: Electrolyte abnormality - evaluation and management  ALVA,RAKESH V. 04-23-14, 6:40 PM

## 2014-04-01 NOTE — ED Notes (Signed)
Paged Dr. Darrick Penna to [phone]

## 2014-04-01 NOTE — ED Notes (Signed)
2 RN's tried to place OG tube. Unsuccessful. Will notify MD. Temp foley placed.

## 2014-04-01 NOTE — ED Notes (Signed)
Abnormal labs given to Dr. Andrey Campanile

## 2014-04-01 NOTE — ED Provider Notes (Signed)
CSN: [id number]     Arrival date & time 2014-04-08  0630 History   First MD Initiated Contact with Patient 04-08-14 [phone]     Chief Complaint  Patient presents with  . Code Sepsis     (Consider location/radiation/quality/duration/timing/severity/associated sxs/prior Treatment) Patient is a 64 y.o. male presenting with fever. The history is provided by the patient and the spouse.  Fever Max temp prior to arrival:  104 Temp source:  Oral Severity:  Severe Onset quality:  Gradual Duration:  2 days Timing:  Constant Progression:  Worsening Chronicity:  New Relieved by:  None tried Worsened by:  Nothing tried Ineffective treatments:  None tried Associated symptoms: chills   Associated symptoms: no chest pain, no confusion, no congestion, no cough, no diarrhea, no dysuria, no nausea and no vomiting   Associated symptoms comment:  No cough.  Back pain and pain in the left groin that started yesterday and worsening.  Also wife noted that pt had redness and swelling in the left lower leg.  Prior hx of multiple surgeries to the left lower ext with fem/pop bypass and vein graft. Risk factors: immunosuppression   Risk factors comment:  Last hospitalized on thanksgiving for PNA and sepsis at [facility]   Past Medical History  Diagnosis Date  . Diabetes mellitus   . Hypertension   . Hyperlipidemia   . Rheumatoid arthritis   . CAD (coronary artery disease)   . Myocardial infarction 2000 & 2006  . GERD (gastroesophageal reflux disease)   . Leg pain   . CHF (congestive heart failure)   . PAD (peripheral artery disease)   . Decubitus ulcer of right ankle   . 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 Jude - single chamber ICD  . Shortness of breath     walking  . History of blood transfusion   . Chronic kidney disease     stage 2 per Dr Hezzie Bump notes.  . Sepsis    Past Surgical History  Procedure Laterality Date  . Hernia repair      umbicial  . Carpal  tunnel release Right   . Knee surgery Left     arthroscopy  . Cardiac defibrillator placement  04/08/2012    St. Jude Device - single chamber ICD  . Difibulator    . Patch angioplasty Right 03/14/2013    Procedure: PATCH ANGIOPLASTY Right Common Femoral, Superficial Femoral and Profunda-Femoral;  Surgeon: Nada Libman, MD;  Location: [facility] OR;  Service: Vascular;  Laterality: Right;  . Abdominal aortagram N/A 03/14/2013    Procedure: ABDOMINAL AORTAGRAM with right leg runoff.  Catheter in aorta times one;  Surgeon: Nada Libman, MD;  Location: [facility] OR;  Service: Vascular;  Laterality: N/A;  . Insertion of iliac stent Right 03/14/2013    Procedure: INSERTION OF ILIAC STENT Right Common and External ;  Surgeon: Nada Libman, MD;  Location: [facility] OR;  Service: Vascular;  Laterality: Right;  . I&d extremity Right 04/26/2013    Procedure: IRRIGATION AND DEBRIDEMENT right groin, removal of prostectic patch;  Surgeon: Larina Earthly, MD;  Location: [facility] OR;  Service: Vascular;  Laterality: Right;  . Patch angioplasty Right 04/26/2013    Procedure: Vein PATCH ANGIOPLASTY of common femoral and SFA;  Surgeon: Larina Earthly, MD;  Location: [facility] OR;  Service: Vascular;  Laterality: Right;  . Coronary artery bypass graft  06  . Colonoscopy    . Insertion of iliac stent Left 2011  . Coronary  stents  2004  . Femoral-popliteal bypass graft Left 09/26/2013    Procedure: BYPASS GRAFT FEMORAL-BELOW KNEE POPLITEAL ARTERY USING GORE PROPATEN VASCULAR GRAFT;  Surgeon: Chuck Hint, MD;  Location: [facility] OR;  Service: Vascular;  Laterality: Left;  . Intraoperative arteriogram Left 09/26/2013    Procedure: INTRA OPERATIVE ARTERIOGRAM;  Surgeon: Chuck Hint, MD;  Location: [facility] OR;  Service: Vascular;  Laterality: Left;  . Abdominal aortagram N/A 03/13/2013    Procedure: ABDOMINAL Ronny Flurry;  Surgeon: Chuck Hint, MD;  Location: [facility] CATH LAB;  Service: Cardiovascular;  Laterality: N/A;  . Abdominal  aortagram N/A 09/25/2013    Procedure: ABDOMINAL AORTAGRAM;  Surgeon: Chuck Hint, MD;  Location: [facility] CATH LAB;  Service: Cardiovascular;  Laterality: N/A;   Family History  Problem Relation Age of Onset  . Hyperlipidemia Mother   . Diabetes Father   . Hypertension Father   . Heart disease Father   . Heart attack Father    History  Substance Use Topics  . Smoking status: Former Smoker -- 31 years    Types: Cigarettes    Quit date: 03/30/1997  . Smokeless tobacco: Never Used  . Alcohol Use: 4.2 oz/week    7 Glasses of wine per week    Review of Systems  Constitutional: Positive for fever and chills.  HENT: Negative for congestion.   Respiratory: Negative for cough.   Cardiovascular: Negative for chest pain.  Gastrointestinal: Negative for nausea, vomiting and diarrhea.  Genitourinary: Negative for dysuria.  Psychiatric/Behavioral: Negative for confusion.  All other systems reviewed and are negative.     Allergies  Lipitor; Lodine; Methotrexate derivatives; Remicade; and Ranitidine hcl  Home Medications   Prior to Admission medications   Medication Sig Start Date End Date Taking? Authorizing Provider  Calcium Carb-Cholecalciferol (CALCIUM 1000 + D PO) Take 1 tablet by mouth daily.     Yes Historical Provider, MD  carvedilol (COREG) 25 MG tablet Take 25 mg by mouth 2 (two) times daily with a meal.    Historical Provider, MD  cephALEXin (KEFLEX) 500 MG capsule Take 1 capsule (500 mg total) by mouth 3 (three) times daily. 09/28/13   Raymond Gurney, PA-C  clopidogrel (PLAVIX) 75 MG tablet Take 75 mg by mouth daily.      Historical Provider, MD  digoxin (LANOXIN) 0.125 MG tablet Take 1 tablet (0.125 mg total) by mouth daily. 03/26/13   Belkys A Regalado, MD  enoxaparin (LOVENOX) 100 MG/ML injection Inject 0.9 mLs (90 mg total) into the skin every 12 (twelve) hours. 09/28/13   Raymond Gurney, PA-C  ferrous gluconate (FERGON) 325 MG tablet Take 325 mg by mouth daily with  breakfast.      Historical Provider, MD  fish oil-omega-3 fatty acids 1000 MG capsule Take 2 g by mouth daily.      Historical Provider, MD  furosemide (LASIX) 40 MG tablet Take 1 tablet (40 mg total) by mouth daily. 05/05/13   [name] Art, DO  gabapentin (NEURONTIN) 300 MG capsule Take 600 mg by mouth 3 (three) times daily. 05/05/13   [name] Art, DO  glyBURIDE-metformin (GLUCOVANCE) 5-500 MG per tablet Take 1-2 tablets by mouth daily with breakfast. 2 Tabs am and 1 Tab at bedtime    Historical Provider, MD  HYDROcodone-acetaminophen (NORCO) 10-325 MG per tablet Take 1 tablet by mouth every 6 (six) hours as needed.    Historical Provider, MD  Insulin Glargine (LANTUS SOLOSTAR) 100 UNIT/ML Solostar Pen Inject 10 Units into  the skin daily. 08/11/13   Historical Provider, MD  leflunomide (ARAVA) 20 MG tablet Take 20 mg by mouth daily.    Historical Provider, MD  levofloxacin (LEVAQUIN) 500 MG tablet Take 1 tablet (500 mg total) by mouth daily. 09/28/13   Raymond Gurney, PA-C  losartan (COZAAR) 25 MG tablet Take 1 tablet (25 mg total) by mouth daily. 05/05/13   [name] Art, DO  magnesium oxide (MAG-OX) 400 MG tablet Take 400 mg by mouth daily.    Historical Provider, MD  nitroGLYCERIN (NITROSTAT) 0.4 MG SL tablet Place 1 tablet (0.4 mg total) under the tongue every 5 (five) minutes x 3 doses as needed for chest pain. 03/25/13   Belkys A Regalado, MD  oxyCODONE-acetaminophen (PERCOCET/ROXICET) 5-325 MG per tablet Take 1-2 tablets by mouth every 6 (six) hours as needed for moderate pain. 09/28/13   Raymond Gurney, PA-C  oxyCODONE-acetaminophen (ROXICET) 5-325 MG per tablet Take 1-2 tablets by mouth every 4 (four) hours as needed for severe pain. 10/12/13   Chuck Hint, MD  pantoprazole (PROTONIX) 40 MG tablet Take 1 tablet (40 mg total) by mouth 2 (two) times daily. 05/05/13   [name] Art, DO  potassium chloride SA (K-DUR,KLOR-CON) 20 MEQ tablet Take 20 mEq by mouth daily. 08/15/13   Historical  Provider, MD  predniSONE (DELTASONE) 5 MG tablet Take 5 mg by mouth 3 (three) times daily.  08/28/11   Historical Provider, MD  sitaGLIPtin (JANUVIA) 100 MG tablet Take 100 mg by mouth at bedtime.     Historical Provider, MD  warfarin (COUMADIN) 6 MG tablet Take 1 tablet (6 mg total) by mouth daily. 09/28/13   Cala Bradford A Trinh, PA-C   BP 125/47 mmHg  Pulse 117  Temp(Src) 104.6 F (40.3 C) (Rectal)  Resp 16  Ht 5\' 5"  (1.651 m)  Wt 185 lb (83.915 kg)  BMI 30.79 kg/m2  SpO2 97% Physical Exam  Constitutional: He is oriented to person, place, and time. He appears well-developed and well-nourished. No distress.  HENT:  Head: Normocephalic and atraumatic.  Mouth/Throat: Oropharynx is clear and moist. Mucous membranes are dry.  Eyes: Conjunctivae and EOM are normal. Pupils are equal, round, and reactive to light.  Neck: Normal range of motion. Neck supple.  Cardiovascular: Regular rhythm and intact distal pulses.  Tachycardia present.   No murmur heard. Pulmonary/Chest: Effort normal and breath sounds normal. No respiratory distress. He has no wheezes. He has no rales.  Abdominal: Soft. He exhibits no distension. There is no tenderness. There is no rebound and no guarding.  Musculoskeletal: Normal range of motion. He exhibits no edema.       Left hip: He exhibits tenderness.       Lumbar back: He exhibits tenderness.       Back:       Legs: Neurological: He is alert and oriented to person, place, and time.  Skin: Skin is warm and dry. No rash noted. No erythema.  Psychiatric: He has a normal mood and affect. His behavior is normal.  Nursing note and vitals reviewed.   ED Course  Procedures (including critical care time) Labs Review Labs Reviewed  CBC WITH DIFFERENTIAL - Abnormal; Notable for the following:    WBC 12.5 (*)    Hemoglobin 9.6 (*)    HCT 32.8 (*)    MCV 75.8 (*)    MCH 22.2 (*)    MCHC 29.3 (*)    RDW 19.0 (*)    Neutrophils Relative %  83 (*)    Neutro Abs 10.3 (*)     Lymphocytes Relative 11 (*)    All other components within normal limits  COMPREHENSIVE METABOLIC PANEL - Abnormal; Notable for the following:    Potassium 3.4 (*)    Glucose, Bld 109 (*)    Albumin 3.1 (*)    GFR calc non Af Amer 72 (*)    GFR calc Af Amer 84 (*)    All other components within normal limits  URINALYSIS, ROUTINE W REFLEX MICROSCOPIC - Abnormal; Notable for the following:    Color, Urine AMBER (*)    APPearance CLOUDY (*)    Protein, ur 30 (*)    All other components within normal limits  PROTIME-INR - Abnormal; Notable for the following:    Prothrombin Time 22.8 (*)    INR 1.99 (*)    All other components within normal limits  I-STAT CG4 LACTIC ACID, ED - Abnormal; Notable for the following:    Lactic Acid, Venous 3.90 (*)    All other components within normal limits  I-STAT CG4 LACTIC ACID, ED - Abnormal; Notable for the following:    Lactic Acid, Venous 3.55 (*)    All other components within normal limits  I-STAT TROPOININ, ED - Abnormal; Notable for the following:    Troponin i, poc 0.38 (*)    All other components within normal limits  CULTURE, BLOOD (ROUTINE X 2)  CULTURE, BLOOD (ROUTINE X 2)  URINE CULTURE  URINE MICROSCOPIC-ADD ON  I-STAT CG4 LACTIC ACID, ED  I-STAT CG4 LACTIC ACID, ED    Imaging Review Dg Chest Port 1 View  (if Code Sepsis Called)  2014-04-18   CLINICAL DATA:  Sepsis.  EXAM: PORTABLE CHEST - 1 VIEW  COMPARISON:  September 25, 2013  FINDINGS: The heart size and mediastinal contours are stable. Patient is status post prior median sternotomy and CABG. Cardiac pacemaker is unchanged. The heart size is enlarged. There is stable minor scar of the lateral left lung base unchanged. There is no focal infiltrate, pulmonary edema, or pleural effusion. The visualized skeletal structures are stable.  IMPRESSION: No active cardiopulmonary disease.  Cardiomegaly.   Electronically Signed   By: Sherian Rein M.D.   On: April 18, 2014 08:29     EKG  Interpretation   Date/Time:  Sunday April 18, 2014 06:44:30 EST Ventricular Rate:  115 PR Interval:  137 QRS Duration: 143 QT Interval:  329 QTC Calculation: 455 R Axis:   -28 Text Interpretation:  Sinus tachycardia Right bundle branch block Baseline  wander in lead(s) I aVL No significant change since last tracing Confirmed  by Anitra Lauth  MD, Alphonzo Lemmings ([id number]) on 04/18/2014 7:28:48 AM      MDM   Final diagnoses:  Septic shock  Cellulitis of left lower extremity  Endotracheally intubated    Patient with a complicated medical history including multiple episodes of sepsis, last hospitalized with sepsis and pneumonia in November at [facility]. he has a significant history for rheumatoid arthritis and is on multiple immune modulators.  Patient started feeling not well yesterday however refused to come to the hospital until today. Wife states that yesterday he started to complain of back and left groin pain he has had extensive procedures done on the left groin including femoropopliteal bypass and vein grafting in the left lower leg.  Wife had noted redness and swelling in the leg which has occurred before but was not there 2 days ago.  Denies any cough, shortness of breath,  abdominal pain, nausea, vomiting, diarrhea. He is currently not on any antibiotics.  Patient started on sepsis protocol.  Currently he is awake alert and answering questions and has no signs concerning for meningitis. He has no abdominal pain, chest pain or shortness of breath. Patient is tachycardic but lungs are clear without palpable abdominal pain. Leg on the left is swollen and red from the knee down concerning for cellulitis but no sign of abscess.  CBC, CMP, UA, INR, lactic acid, troponin, chest x-ray, no extremity duplex. EKG unchanged from prior.  9:56 AM Labs consistent with a leukocytosis of 12,000, lactate elevated at 3.9 and normal creatinine. Patient has a mild bump in his troponin at 0.38. Patient  covered broadly with Vanco and Zosyn for suspected cellulitis. Discussed patient with critical care who recommended completing his 2-1/2 L bolus and repeating a lactate. Lactate is improving pill the patient is stable enough to go to stepdown as his blood pressure has remained greater than 120 however if the lactate is worsening or not improved he will go to critical care.  10:48 AM Lactate is about the same at 3.55 however patient is now hypotensive and complaining of shortness of breath. Oxygen saturation remains in the 90s however he now is in a nonrebreather and complaining of lower leg pain.  Patient has now received 2-1/2 L of fluid and has a known EF of 25%. Discussed with critical care at this time will start Levaquin had thorough peripheral line. Concerned that if patient has laid flat he would be unable to breathe.  CRITICAL CARE Performed by: Gwyneth Sprout Total critical care time: 90 Critical care time was exclusive of separately billable procedures and treating other patients. Critical care was necessary to treat or prevent imminent or life-threatening deterioration. Critical care was time spent personally by me on the following activities: development of treatment plan with patient and/or surrogate as well as nursing, discussions with consultants, evaluation of patient's response to treatment, examination of patient, obtaining history from patient or surrogate, ordering and performing treatments and interventions, ordering and review of laboratory studies, ordering and review of radiographic studies, pulse oximetry and re-evaluation of patient's condition.    Gwyneth Sprout, MD 04/16/14 [phone]

## 2014-04-01 NOTE — ED Notes (Signed)
Dr. Marchelle Gearing preparing to place central line

## 2014-04-01 NOTE — ED Notes (Signed)
Admitting MD at bedside.

## 2014-04-01 NOTE — ED Notes (Signed)
Gave report to [place] on 22M. Dr. Marchelle Gearing still at bedside placing central line. Will take pt up once foley and OG in place.

## 2014-04-01 NOTE — ED Notes (Signed)
Placed pt on nonrebreather. Pt's sats 88-92% on 2L [place]. Pt very labored. Lung sounds clear bilateral upper lobes, some crackles noted in the bases.

## 2014-04-01 NOTE — ED Notes (Signed)
Pt's HR 120's.

## 2014-04-01 NOTE — ED Notes (Signed)
X-ray at bedside

## 2014-04-01 NOTE — Procedures (Signed)
Central Venous Catheter Insertion Procedure Note [name] [name] [id number] 27-Aug-1950  Procedure: Insertion of Central Venous Catheter Indications: Assessment of intravascular volume, Drug and/or fluid administration and Frequent blood sampling  Procedure Details Consent: Risks of procedure as well as the alternatives and risks of each were explained to the (patient/caregiver).  Consent for procedure obtained. Time Out: Verified patient identification, verified procedure, site/side was marked, verified correct patient position, special equipment/implants available, medications/allergies/relevent history reviewed, required imaging and test results available.  {time out performed:  Maximum sterile technique was used including antiseptics, cap, gloves, gown, hand hygiene, mask and sheet. Skin prep: Chlorhexidine; local anesthetic administered A antimicrobial bonded/coated triple lumen catheter was placed in the right internal jugular vein using the Seldinger technique.  Evaluation Blood flow good Complications: No apparent complications Patient did tolerate procedure well. Chest X-ray ordered to verify placement.  CXR: pending.  Staphany Ditton Apr 16, 2014, 12:23 PM  Real time 2D ultrasound used for vein site selection, patency assessment, and needle entry.  A record of image was made but could not be submitted for filing due to malfunction of printing device  Dr. Kalman Shan, M.D., [facility].C.P Pulmonary and Critical Care Medicine Staff Physician [facility] [place] [facility] Pager: [phone], If no answer or between  15:00h - 7:00h: call [phone]  16-Apr-2014 12:24 PM

## 2014-04-01 NOTE — Progress Notes (Signed)
D/w Dr Darrick Penna  - complex femoral artery anatomy, concern for infection  Plan  - no femoral aline per Dr Darrick Penna  - CT scan to look for vascular infectiionon   Dr. Kalman Shan, M.D., [facility].C.P Pulmonary and Critical Care Medicine Staff Physician  [facility] [place] [facility] and Critical [facility] Pager: [phone], If no answer or between  15:00h - 7:00h: call [phone]  05-01-2014 4:26 PM

## 2014-04-01 NOTE — ED Notes (Signed)
Dr.Plunkett aware of repeat Lactic Acid.ED-Lab

## 2014-04-01 NOTE — Consult Note (Signed)
[facility] [place] HISTORY AND PHYSICAL  Requesting: ER/Critical Care Service Reason for consult: Rule out vascular graft as septic source History of Present Illness:  Patient is a 64 y.o. year old male who presents for evaluation of left leg bypass as possible septic source.  Pt admitted with fever earlier today and complaining of bilateral leg/groin pain per history.  Pt is currently unable to provide history as he is on a ventilator.  Pt ER course rapidly deteriorated requiring pressors and intubation.  Pt previously had left fem below knee pop bypass with PTFE by my partner Dr Edilia Bo June 2015 and amputation of toe in [place] subsequently and had apparently been doing ok from this as of last office note Oct 2015.  The pt was on coumadin at that time.  He has also previously had right common and external iliac stent and right femoral endarterectomy by Dr Myra Gianotti.  He had to have the right groin patch removed for infection by Dr Early and this was replaced with a vein patch. The patient is currently on a Neo/levophed/dobutamine drips as well as Vanc and Zosyn antibiotic coverage.  He was on Plavix and coumadin for anticoagulation.  He is immunosuppressed on chronic prednisone.    Other chronic medical problems include DM, HTN, hyperlipid, Rheumatoid arthritis, decreased EF with defibrillator, stage II CKD which all had been stable prior to admission.  Past Medical History  Diagnosis Date  . Diabetes mellitus   . Hypertension   . Hyperlipidemia   . Rheumatoid arthritis   . CAD (coronary artery disease)   . Myocardial infarction 2000 & 2006  . GERD (gastroesophageal reflux disease)   . Leg pain   . CHF (congestive heart failure)   . PAD (peripheral artery disease)   . Decubitus ulcer of right ankle   . 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 Jude - single chamber ICD  . Shortness of breath     walking  . History of blood transfusion    . Chronic kidney disease     stage 2 per Dr Hezzie Bump notes.  . Sepsis     Past Surgical History  Procedure Laterality Date  . Hernia repair      umbicial  . Carpal tunnel release Right   . Knee surgery Left     arthroscopy  . Cardiac defibrillator placement  04/08/2012    St. Jude Device - single chamber ICD  . Difibulator    . Patch angioplasty Right 03/14/2013    Procedure: PATCH ANGIOPLASTY Right Common Femoral, Superficial Femoral and Profunda-Femoral;  Surgeon: Nada Libman, MD;  Location: [facility] OR;  Service: Vascular;  Laterality: Right;  . Abdominal aortagram N/A 03/14/2013    Procedure: ABDOMINAL AORTAGRAM with right leg runoff.  Catheter in aorta times one;  Surgeon: Nada Libman, MD;  Location: [facility] OR;  Service: Vascular;  Laterality: N/A;  . Insertion of iliac stent Right 03/14/2013    Procedure: INSERTION OF ILIAC STENT Right Common and External ;  Surgeon: Nada Libman, MD;  Location: [facility] OR;  Service: Vascular;  Laterality: Right;  . I&d extremity Right 04/26/2013    Procedure: IRRIGATION AND DEBRIDEMENT right groin, removal of prostectic patch;  Surgeon: Larina Earthly, MD;  Location: [facility] OR;  Service: Vascular;  Laterality: Right;  . Patch angioplasty Right 04/26/2013    Procedure: Vein PATCH ANGIOPLASTY of common femoral and SFA;  Surgeon: Larina Earthly, MD;  Location: [facility] OR;  Service: Vascular;  Laterality: Right;  . Coronary artery bypass graft  06  . Colonoscopy    . Insertion of iliac stent Left 2011  . Coronary stents  2004  . Femoral-popliteal bypass graft Left 09/26/2013    Procedure: BYPASS GRAFT FEMORAL-BELOW KNEE POPLITEAL ARTERY USING GORE PROPATEN VASCULAR GRAFT;  Surgeon: Chuck Hint, MD;  Location: [facility] OR;  Service: Vascular;  Laterality: Left;  . Intraoperative arteriogram Left 09/26/2013    Procedure: INTRA OPERATIVE ARTERIOGRAM;  Surgeon: Chuck Hint, MD;  Location: [facility] OR;  Service: Vascular;  Laterality: Left;  . Abdominal aortagram  N/A 03/13/2013    Procedure: ABDOMINAL Ronny Flurry;  Surgeon: Chuck Hint, MD;  Location: [facility] CATH LAB;  Service: Cardiovascular;  Laterality: N/A;  . Abdominal aortagram N/A 09/25/2013    Procedure: ABDOMINAL AORTAGRAM;  Surgeon: Chuck Hint, MD;  Location: [facility] CATH LAB;  Service: Cardiovascular;  Laterality: N/A;    Social History History  Substance Use Topics  . Smoking status: Former Smoker -- 31 years    Types: Cigarettes    Quit date: 03/30/1997  . Smokeless tobacco: Never Used  . Alcohol Use: 4.2 oz/week    7 Glasses of wine per week    Family History Family History  Problem Relation Age of Onset  . Hyperlipidemia Mother   . Diabetes Father   . Hypertension Father   . Heart disease Father   . Heart attack Father     Allergies  Allergies  Allergen Reactions  . Lipitor [Atorvastatin] Other (See Comments)    Weak muscles  . Lodine [Etodolac] Nausea And Vomiting  . Methotrexate Derivatives Nausea And Vomiting  . Remicade [Infliximab] Other (See Comments)    Chills and shakes   . Ranitidine Hcl Nausea Only     Current Facility-Administered Medications  Medication Dose Route Frequency Provider Last Rate Last Dose  . 0.9 %  sodium chloride infusion  250 mL Intravenous PRN Kalman Shan, MD      . 0.9 %  sodium chloride infusion   Intravenous Continuous Kalman Shan, MD      . 0.9 %  sodium chloride infusion   Intravenous Continuous Kalman Shan, MD 125 mL/hr at 04/22/2014 1334    . antiseptic oral rinse (CPC / CETYLPYRIDINIUM CHLORIDE 0.05%) solution 7 mL  7 mL Mouth Rinse QID Kalman Shan, MD      . chlorhexidine (PERIDEX) 0.12 % solution 15 mL  15 mL Mouth Rinse BID Kalman Shan, MD      . clopidogrel (PLAVIX) tablet 75 mg  75 mg Oral Daily Kalman Shan, MD      . DOBUTamine (DOBUTREX) infusion 4000 mcg/mL  2.5 mcg/kg/min Intravenous Titrated Kalman Shan, MD      . fentaNYL (SUBLIMAZE) 2,500 mcg in sodium chloride 0.9 % 250  mL (10 mcg/mL) infusion  25-400 mcg/hr Intravenous Continuous Kalman Shan, MD 5 mL/hr at 04/27/2014 1333 50 mcg/hr at 03/30/2014 1333  . fentaNYL (SUBLIMAZE) bolus via infusion 50 mcg  50 mcg Intravenous Q1H PRN Kalman Shan, MD   50 mcg at 04/17/2014 1333  . fentaNYL (SUBLIMAZE) injection 50 mcg  50 mcg Intravenous Once Kalman Shan, MD   50 mcg at 04/25/2014 1323  . heparin ADULT infusion 100 units/mL (25000 units/250 mL)  1,100 Units/hr Intravenous Continuous Lauren Bajbus, RPH 11 mL/hr at 04/13/2014 1351 1,100 Units/hr at 04/01/14 1351  . hydrocortisone sodium succinate (SOLU-CORTEF) 100 MG injection 50 mg  50 mg Intravenous Q6H Kalman Shan, MD      .  insulin aspart (novoLOG) injection 2-6 Units  2-6 Units Subcutaneous 6 times per day Kalman Shan, MD   2 Units at 04/19/2014 1336  . ipratropium-albuterol (DUONEB) 0.5-2.5 (3) MG/3ML nebulizer solution 3 mL  3 mL Nebulization Q6H Kalman Shan, MD   3 mL at 04/20/2014 1541  . lidocaine (cardiac) 100 mg/102ml (XYLOCAINE) 20 MG/ML injection 2%           . norepinephrine (LEVOPHED) 4 mg in dextrose 5 % 250 mL (0.016 mg/mL) infusion  5-50 mcg/min Intravenous Titrated Kalman Shan, MD 75 mL/hr at 03/31/2014 1459 20 mcg/min at 04/08/2014 1459  . pantoprazole (PROTONIX) injection 40 mg  40 mg Intravenous QHS Kalman Shan, MD      . phenylephrine (NEO-SYNEPHRINE) 40 mg in dextrose 5 % 250 mL (0.16 mg/mL) infusion  20-200 mcg/min Intravenous Titrated Kalman Shan, MD 67.5 mL/hr at 04/04/2014 1532 180 mcg/min at 04/14/2014 1532  . piperacillin-tazobactam (ZOSYN) IVPB 3.375 g  3.375 g Intravenous Q8H Gwyneth Sprout, MD      . sennosides (SENOKOT) 8.8 MG/5ML syrup 5 mL  5 mL Oral BID PRN Kalman Shan, MD      . succinylcholine (ANECTINE) 20 MG/ML injection           . vancomycin (VANCOCIN) IVPB 1000 mg/200 mL premix  1,000 mg Intravenous Q12H Kalman Shan, MD      . vasopressin (PITRESSIN) 40 Units in sodium chloride 0.9 % 250 mL (0.16  Units/mL) infusion  0.03 Units/min Intravenous Continuous Kalman Shan, MD 11.3 mL/hr at 04/04/2014 1533 0.03 Units/min at 04/28/2014 1533    ROS:   Unable to obtain on ventilator  Physical Examination  Filed Vitals:   04/25/2014 1445 04/29/2014 1500 04/22/2014 1529 04/27/2014 1541  BP: 68/52 64/22    Pulse:  95    Temp:   99.1 F (37.3 C)   TempSrc:   Oral   Resp: 26 24    Height:      Weight:      SpO2: 100% 100%  100%    Body mass index is 30.45 kg/(m^2).  General:  On vent able to answer yes no questions somewhat but difficult to understand HEENT: Normal Cardiac: Regular Rate and Rhythm Abdomen: Soft, non-tender, non-distended, obese Skin: No rash visible no open wounds visible legs Extremity Pulses: absent radial bilaterally, 2+ brachial, femoral absent dorsalis pedis, posterior tibial pulses bilaterally, has monophasic DP/PT doppler right foot and biphasic DP/PT doppler left foot, feet are symmetrically cool but have cap refill of 1-2 sec Musculoskeletal: No deformity or edema  Neurologic: Upper and lower extremity motor 5/5 and symmetric  DATA: CBC    Component Value Date/Time   WBC 17.2* 04/27/2014 1229   RBC 3.64* 04/23/2014 1229   HGB 8.3* 04/12/2014 1229   HCT 28.0* 04/12/2014 1229   PLT 213 03/31/2014 1229   MCV 76.9* 04/05/2014 1229   MCH 22.8* 04/25/2014 1229   MCHC 29.6* 04/24/2014 1229   RDW 19.1* 04/28/2014 1229   LYMPHSABS 2.2 03/30/2014 1229   MONOABS 0.8 04/10/2014 1229   EOSABS 0.1 04/16/2014 1229   BASOSABS 0.0 04/20/2014 1229    BMET    Component Value Date/Time   NA 138 04/05/2014 1229   K 3.5 04/03/2014 1229   CL 107 04/14/2014 1229   CO2 20 04/24/2014 1229   GLUCOSE 115* 04/18/2014 1229   BUN 15 04/11/2014 1229   CREATININE 1.74* 04/18/2014 1229   CALCIUM 7.8* 04/19/2014 1229   GFRNONAA 41* 04/13/2014 1229   GFRAA 47*  04/08/2014 1229    ASSESSMENT:  Septic with left leg pain.  No obvious pointing signs of graft infection but is at  risk with prosthetic graft and immunosuppression.  No evidence of acute ischemia of either leg and has adequate perfusion to both legs even while on multiple pressors   PLAN:  Would obtain non contrast CT scan of left leg to look for perigraft fluid or abscess if stable for transport.  Continue current antibiotics.  If large abscess would consider draining emergently if has more subtle findings of perigraft fluid would prefer further resuscitation prior to considering graft removal which will be lengthy operation with blood loss.  If no graft fluid or abscess would continue to pursue other septic sources.  Needs INR fully corrected in the event he needs operation.  Will give 2 units FFP now.  Ok to continue heparin  Fabienne Bruns, MD [facility] [place] Office: [phone] Pager: [phone]

## 2014-04-01 NOTE — H&P (Signed)
PULMONARY / CRITICAL CARE MEDICINE   Name: [name] [name] MRN: [id number] DOB: 1950-10-25  PCP Gaspar Skeeters, MD Primary [facility]  - Dr Diskcosn  ADMISSION DATE:  14-Apr-2014 CONSULTATION DATE:  2014-04-14   REFERRING MD :  ER doc [facility] - Dr Signe Colt  CHIEF COMPLAINT:  Septic shock, acidosis, limb pain, Acute resp failure  HPI: 64 year old with multiple medical prolbmes inlcuding RA (on orencia, arava, prednisone), IHD/CAD - s/p CABG, defib,  And chronic systolic cHF ef 25%, (plavix + copumadin + other meds) sever PVD (mutltiple interventions with stents and bypass and amputation Left great toe), DM (on insulin + meds), chronic pain (opioids + gabapentin ). Admitted to ER 04/14/14 with few to several day hx of bilateral groin and LE pain along with redness of LLE (feels similar to prior vascular obsturction). IN ER - significant SIRS with occult septic shock (febrile, with lactate 3.9). Lactate did not clear with fluid bolus and subsequently becaome hypoxemic, hypotensive/shock needing pressors, and worsening respiratory distress. PCCM called to admit - patient intubated and central lined in ER 04/14/2014. Full code expressed by highly involved family. OVer course of ER R > L foot felt to be COLD   SIGNIFICANT EVENTS: Admit 04/14/2014 - intubate, central line   PSX  has past surgical history that includes Hernia repair; Carpal tunnel release (Right); Knee surgery (Left); Cardiac defibrillator placement (04/08/2012); difibulator; Patch angioplasty (Right, 03/14/2013); abdominal aortagram (N/A, 03/14/2013); Insertion of iliac stent (Right, 03/14/2013); I&D extremity (Right, 04/26/2013); Patch angioplasty (Right, 04/26/2013); Coronary artery bypass graft (06); Colonoscopy; Insertion of iliac stent (Left, 2011); coronary stents (2004); Femoral-popliteal Bypass Graft (Left, 09/26/2013); Intraoperative arteriogram (Left, 09/26/2013); abdominal aortagram (N/A, 03/13/2013); and abdominal aortagram  (N/A, 09/25/2013).   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  has past surgical history that includes Hernia repair; Carpal tunnel release (Right); Knee surgery (Left); Cardiac defibrillator placement (04/08/2012); difibulator; Patch angioplasty (Right, 03/14/2013); abdominal aortagram (N/A, 03/14/2013); Insertion of iliac stent (Right, 03/14/2013); I&D extremity (Right, 04/26/2013); Patch angioplasty (Right, 04/26/2013); Coronary artery bypass graft (06); Colonoscopy; Insertion of iliac stent (Left, 2011); coronary stents (2004); Femoral-popliteal Bypass Graft (Left, 09/26/2013); Intraoperative arteriogram (Left, 09/26/2013); abdominal aortagram (N/A, 03/13/2013); and abdominal aortagram (N/A, 09/25/2013). Prior to Admission medications   Medication Sig Start Date End Date Taking? Authorizing Provider  Abatacept (ORENCIA) 125 MG/ML SOSY Inject 125 mg into the skin once a week. On Saturday   Yes Historical Provider, MD  Calcium Carb-Cholecalciferol (CALCIUM 1000 + D PO) Take 1 tablet by mouth daily.     Yes Historical Provider, MD  clopidogrel (PLAVIX) 75 MG tablet Take 75 mg by mouth daily.     Yes Historical Provider, MD  ezetimibe (ZETIA) 10 MG tablet Take 10 mg by mouth daily.   Yes Historical Provider, MD  ferrous gluconate (FERGON) 325 MG tablet Take 325 mg by mouth daily with breakfast.     Yes Historical Provider, MD  fish oil-omega-3 fatty acids 1000 MG capsule Take 2 g by mouth daily.     Yes Historical Provider, MD  furosemide (LASIX) 40 MG tablet  Take 1 tablet (40 mg total) by mouth daily. Patient taking  differently: Take 20 mg by mouth daily as needed.  05/05/13  Yes [name] Art, DO  gabapentin (NEURONTIN) 600 MG tablet Take 1,200 mg by mouth 2 (two) times daily.   Yes Historical Provider, MD  glyBURIDE-metformin (GLUCOVANCE) 5-500 MG per tablet Take 1-2 tablets by mouth daily with breakfast. 2 Tabs am and 1 Tab at bedtime   Yes Historical Provider, MD  glyBURIDE-metformin (GLUCOVANCE) 5-500 MG per tablet Take 1-2 tablets by mouth See admin instructions. Take 2 tablets with breakfast and 1 tablet at bedtime   Yes Historical Provider, MD  HYDROcodone-acetaminophen (NORCO) 10-325 MG per tablet Take 1 tablet by mouth every 6 (six) hours as needed.   Yes Historical Provider, MD  insulin glargine (LANTUS) 100 UNIT/ML injection Inject 20 Units into the skin at bedtime as needed (only takes if blood sugar is near 300 or more).    Yes Historical Provider, MD  isosorbide mononitrate (IMDUR) 30 MG 24 hr tablet Take 30 mg by mouth daily.   Yes Historical Provider, MD  leflunomide (ARAVA) 20 MG tablet Take 20 mg by mouth daily.   Yes Historical Provider, MD  lisinopril (PRINIVIL,ZESTRIL) 5 MG tablet Take 5 mg by mouth daily.   Yes Historical Provider, MD  magnesium oxide (MAG-OX) 400 MG tablet Take 400 mg by mouth daily.   Yes Historical Provider, MD  metoprolol succinate (TOPROL-XL) 25 MG 24 hr tablet Take 75 mg by mouth daily.   Yes Historical Provider, MD  nitroGLYCERIN (NITROSTAT) 0.4 MG SL tablet Place 1 tablet (0.4 mg total) under the tongue every 5 (five) minutes x 3 doses as needed for chest pain. 03/25/13  Yes Belkys A Regalado, MD  oxyCODONE-acetaminophen (PERCOCET/ROXICET) 5-325 MG per tablet Take 1-2 tablets by mouth every 6 (six) hours as needed for moderate pain. 09/28/13  Yes Kimberly A Trinh, PA-C  pantoprazole (PROTONIX) 40 MG tablet Take 1 tablet (40 mg total) by mouth 2 (two) times daily. 05/05/13  Yes [name] Art, DO  potassium  chloride SA (K-DUR,KLOR-CON) 20 MEQ tablet Take 20 mEq by mouth daily as needed (only takes with furosemide).   Yes Historical Provider, MD  predniSONE (DELTASONE) 5 MG tablet Take 5 mg by mouth 2 (two) times daily with a meal.   Yes Historical Provider, MD  sitaGLIPtin (JANUVIA) 100 MG tablet Take 100 mg by mouth at bedtime.    Yes Historical Provider, MD  warfarin (COUMADIN) 5 MG tablet Take 5 mg by mouth daily at 6 PM.   Yes Historical Provider, MD  warfarin (COUMADIN) 6 MG tablet Take 1 tablet (6 mg total) by mouth daily. Patient taking differently: Take 5 mg by mouth daily at 6 PM.  09/28/13  Yes Raymond Gurney, PA-C  carvedilol (COREG) 25 MG tablet Take 25 mg by mouth 2 (two) times daily with a meal.    Historical Provider, MD  cephALEXin (KEFLEX) 500 MG capsule Take 1 capsule (500 mg total) by mouth 3 (three) times daily. Patient not taking: Reported on 04/07/2014 09/28/13   Raymond Gurney, PA-C  digoxin (LANOXIN) 0.125 MG tablet Take 1 tablet (0.125 mg total) by mouth daily. Patient not taking: Reported on 04/27/2014 03/26/13   Belkys A Regalado, MD  enoxaparin (LOVENOX) 100 MG/ML injection Inject 0.9 mLs (90 mg total) into the skin every 12 (twelve) hours. Patient not taking: Reported on 04/01/2014 09/28/13   Raymond Gurney, PA-C  gabapentin (NEURONTIN) 300 MG capsule Take 600 mg by mouth 3 (three) times daily. 05/05/13  [name] Art, DO  Insulin Glargine (LANTUS SOLOSTAR) 100 UNIT/ML Solostar Pen Inject 10 Units into the skin daily. 08/11/13   Historical Provider, MD  levofloxacin (LEVAQUIN) 500 MG tablet Take 1 tablet (500 mg total) by mouth daily. Patient not taking: Reported on 2014-04-20 09/28/13   Raymond Gurney, PA-C  losartan (COZAAR) 25 MG tablet Take 1 tablet (25 mg total) by mouth daily. Patient not taking: Reported on 20-Apr-2014 05/05/13   [name] Art, DO  oxyCODONE-acetaminophen (ROXICET) 5-325 MG per tablet Take 1-2 tablets by mouth every 4 (four) hours as needed for severe  pain. Patient not taking: Reported on 04/20/2014 10/12/13   Chuck Hint, MD  potassium chloride SA (K-DUR,KLOR-CON) 20 MEQ tablet Take 20 mEq by mouth daily. 08/15/13   Historical Provider, MD  predniSONE (DELTASONE) 5 MG tablet Take 5 mg by mouth 3 (three) times daily.  08/28/11   Historical Provider, MD   Allergies  Allergen Reactions  . Lipitor [Atorvastatin] Other (See Comments)    Weak muscles  . Lodine [Etodolac] Nausea And Vomiting  . Methotrexate Derivatives Nausea And Vomiting  . Remicade [Infliximab] Other (See Comments)    Chills and shakes   . Ranitidine Hcl Nausea Only    FAMILY HISTORY:  indicated that his mother is alive. He indicated that his father is deceased.  SOCIAL HISTORY:  reports that he quit smoking about 17 years ago. His smoking use included Cigarettes. He smoked 0.00 packs per day for 31 years. He has never used smokeless tobacco. He reports that he drinks about 4.2 oz of alcohol per week. He reports that he does not use illicit drugs.  REVIEW OF SYSTEMS:  As per HPI, rest not obtainable due to critical ill state and immediate intubation need  SUBJECTIVE:   VITAL SIGNS: Temp:  [103.5 F (39.7 C)-104.6 F (40.3 C)] 103.5 F (39.7 C) (01/03 0845) Pulse Rate:  [41-147] 41 (01/03 1215) Resp:  [15-41] 17 (01/03 1236) BP: (58-153)/(23-116) 101/62 mmHg (01/03 1236) SpO2:  [90 %-100 %] 100 % (01/03 1236) FiO2 (%):  [100 %] 100 % (01/03 1140) Weight:  [83.915 kg (185 lb)] 83.915 kg (185 lb) (01/03 0652) HEMODYNAMICS:   VENTILATOR SETTINGS: Vent Mode:  [-] PRVC FiO2 (%):  [100 %] 100 % Set Rate:  [16 bmp] 16 bmp Vt Set:  [490 mL] 490 mL PEEP:  [5 cmH20] 5 cmH20 Plateau Pressure:  [18 cmH20] 18 cmH20 INTAKE / OUTPUT:  Intake/Output Summary (Last 24 hours) at 2014-04-20 1244 Last data filed at 04-20-2014 1043  Gross per 24 hour  Intake   2700 ml  Output    160 ml  Net   2540 ml    PHYSICAL EXAMINATION: General:  Critically ill looking male in  ER bed, I Neuro:  IN groin, back pain and LE pain - Alert and oriented x 3. Hx limited to resp distress and pain HEENT:  FAce Mask o2 on Cardiovascular:  Tachycardic and hypotensive  Chest - scarof cabg and defb + Lungs:  Tachypneic, paradoxical, FAce mas ok2 on. Coarse Abdomen:  SOft, non tender Musculoskeletal:  Both Feet cold - right coldest Skin:  REdness ni left calf area  LABS:  PULMONARY No results for input(s): PHART, PCO2ART, PO2ART, HCO3, TCO2, O2SAT in the last 168 hours.  Invalid input(s): PCO2, PO2  CBC  Recent Labs Lab 04-20-2014 0733  HGB 9.6*  HCT 32.8*  WBC 12.5*  PLT 207    COAGULATION  Recent Labs Lab 04-20-2014 [phone]  INR 1.99*    CARDIAC  No results for input(s): TROPONINI in the last 168 hours. No results for input(s): PROBNP in the last 168 hours.   CHEMISTRY  Recent Labs Lab 05/01/2014 0733  NA 137  K 3.4*  CL 103  CO2 23  GLUCOSE 109*  BUN 12  CREATININE 1.08  CALCIUM 9.0   Estimated Creatinine Clearance: 71.6 mL/min (by C-G formula based on Cr of 1.08).   LIVER  Recent Labs Lab 2014-05-01 0733  AST 25  ALT 20  ALKPHOS 106  BILITOT 0.8  PROT 6.6  ALBUMIN 3.1*  INR 1.99*     INFECTIOUS  Recent Labs Lab 05/01/14 0715 2014/05/01 1030  LATICACIDVEN 3.90* 3.55*     ENDOCRINE CBG (last 3)  No results for input(s): GLUCAP in the last 72 hours.       IMAGING x48h Dg Chest Port 1 View  01-May-2014   CLINICAL DATA:  Central line placement.  Intubated.  EXAM: PORTABLE CHEST - 1 VIEW  COMPARISON:  Earlier same day.  FINDINGS: Endotracheal tube has its tip 1 cm above the carina. Right internal jugular central line has its tip 2 cm above the right atrium. Pacemaker/ AICD is unchanged. No pneumothorax. The right lung is clear. Volume loss in the left lower lobe is slightly worsened.  IMPRESSION: Endotracheal tube and central line well positioned. Slight worsening volume loss in the left lower lobe.   Electronically Signed    By: Paulina Fusi M.D.   On: May 01, 2014 12:44   Dg Chest Port 1 View  (if Code Sepsis Called)  01-May-2014   CLINICAL DATA:  Sepsis.  EXAM: PORTABLE CHEST - 1 VIEW  COMPARISON:  September 25, 2013  FINDINGS: The heart size and mediastinal contours are stable. Patient is status post prior median sternotomy and CABG. Cardiac pacemaker is unchanged. The heart size is enlarged. There is stable minor scar of the lateral left lung base unchanged. There is no focal infiltrate, pulmonary edema, or pleural effusion. The visualized skeletal structures are stable.  IMPRESSION: No active cardiopulmonary disease.  Cardiomegaly.   Electronically Signed   By: Sherian Rein M.D.   On: 05/01/14 08:29       ASSESSMENT / PLAN:  PULMONARY OETT 01-May-2014 >>  A:Acute respiratoyr failure due to Shock and ? Impending pneumonia P:   Intubateed Full Vent support VAP protocol Nebs  VASCULAR A: Significant PAD hx. COncern for acute ischemic limb vessel occlusion P VVS consult - called by Dr Anitra Lauth  CARDIAC CVL 05/01/14 >>  A:  Baseline  - Significant CAD Dx hx with chronic systolic CHF and s/p CABG and Defib ([facility] at [facility]) At admit May 01, 2014  - Circulatory shock - likely septic  P:  EGDT - MAP goal > 55 with sbpo > 90 Cycle enzymes Continue home plavix and anticoagulation Hold other cardiac meds  RENAL A:   At risk for ATN Significant non-clearing lactic acidosis  P:   Maintain BP/HR Monitor Lactate Accurate I/O   GASTROINTESTINAL A:  NPO P:   PPI Consult for Tube feeds   HEMATOLOGIC   A:   Baseline  - anemia of chrnnic diease - 9gm% baseline - chronic anticoagultaion  Current  = unchanged anemia  P:  Pharmacy for anticoagulation PRBC for hgb goal > 7gm% SCD   MSK A RA on 3 immune modulators - chronic steropids P Hold immuned modulators but Rx hydorocrt stress dose  INFECTIOUS A:  SEptic shock - in settting of immunocompromised  state ? Source, ? Cellulitis  LE  P:   Pan culture Vanx/Zosyn  ENDOCRINE A:  Known DM P:   ICU Phase 1 hyperglycemia protocol  NEUROLOGIC A:  Chronic pain - on norco, gabapentin Current  - expressing distress over being awake while on vent  P:   PAD protocol # 2 with fent gtt RASS goal: o to -2    FAMILY  - Updates:  Highly involved family of wife, son, daaughter x 2 (one a pediatric nurse). All updated. They understand he is very ill. Has been through ICU before. Full Code but sounds like he and family not interested in LTAC, trach if it came to that  - Inter-disciplinary family meet or Palliative Care meeting due by:  Day 7 which is 04/09/14    SUMMARY  - septich shock in setting of ef 25% and RA on immune modulators, intubated and on EGDT with MAP goal > 55. Concern for acute limb ischemia - await VVS      The patient is critically ill with multiple organ systems failure and requires high complexity decision making for assessment and support, frequent evaluation and titration of therapies, application of advanced monitoring technologies and extensive interpretation of multiple databases.   Critical Care Time devoted to patient care services described in this note is  75  Minutes. This time reflects time of care of this signee Dr Kalman Shan. This critical care time does not reflect procedure time, or teaching time or supervisory time of PA/NP/Med student/Med Resident etc but could involve care discussion time    Dr. Kalman Shan, M.D., [facility].C.P Pulmonary and Critical Care Medicine Staff Physician [place] [place] Pulmonary and Critical Care Pager: [phone], If no answer or between  15:00h - 7:00h: call [phone]  04/21/2014 1:05 PM

## 2014-04-01 NOTE — Progress Notes (Signed)
Course reviewed at bedside  Patient Active Problem List   Diagnosis Date Noted  . Lactic acidosis 2014/04/18  . Chronic systolic CHF (congestive heart failure), NYHA class 4 2014-04-18  . Shock circulatory 04-18-2014  . Bilateral cold feet 04-18-14  . NSTEMI (non-ST elevated myocardial infarction) Apr 18, 2014  . Acute hip pain 04/18/14  . Acute renal failure 18-Apr-2014  . Atherosclerotic PVD with ulceration 01/10/2014  . PAD (peripheral artery disease) 09/26/2013  . Left foot pain 09/20/2013  . Atherosclerosis of native arteries of the extremities with ulceration(440.23) 08/23/2013  . Drainage from wound-Right groin area 05/24/2013  . Aftercare following surgery of the circulatory system, NEC 05/10/2013  . Nausea alone 05/01/2013  . Chronic systolic heart failure 04/30/2013  . Essential hypertension, benign 04/30/2013  . Rheumatoid arthritis 04/30/2013  . Chronic steroid use 04/30/2013  . Debility 04/30/2013  . GERD (gastroesophageal reflux disease) 04/30/2013  . Other and unspecified hyperlipidemia 04/30/2013  . Septic shock 04/26/2013  . Warfarin anticoagulation 03/24/2013  . Cardiomyopathy, ischemic 03/24/2013  . ICD (implantable cardioverter-defibrillator) in place 03/24/2013  . Atrial flutter 03/17/2013  . Shock 03/16/2013  . Acute respiratory failure 03/15/2013  . Bradycardia 03/14/2013  . Hypotension, unspecified 03/14/2013  . Atrial fibrillation 03/13/2013  . Acute on chronic systolic CHF (congestive heart failure) 03/13/2013  . Diabetes mellitus type 2 with peripheral artery disease 03/13/2013  . CAD (coronary artery disease) 03/13/2013  . S/P CABG x 5 03/13/2013  . Atrial fibrillation, rapid 03/13/2013  . Peripheral vascular disease, unspecified 03/09/2012     - progressive, refractroy shock - likely mix cardiogenic and septic , Wife reports CAD not amerable to cath - now ATN - also, MI  - and sudden broad complex rhtym needing hyperkalemia empriic Rx to  correct  D/w patient and wife and son   - all in unison  - high mortality from MODS understood  - poor baseline quality of life -   - HE wishes for comfort from pain and being able to talk to family currently even if it means few  - curent 2 prioriorities   - pain poorly controlled on fent - Requesting one way extubation and pain control - All in agreement for DNAR but wants to talk to family and say goodbye  PLAN - in accordance with his wish into account - pain control with dilaudid (dc fentanyl)  - extubate under duilaudid cover - dc all labs and tests and antibiotics  - continue pressors at current level - do not escalate - if declines post extubation, transition to full comfort stopping all pressors  Family and paitent know conversation might not be possible but we are trying    Critical Care Time devoted to patient care services described in this note is  45 additional  Minutes. This time reflects time of care of this signee Dr Kalman Shan. This critical care time does not reflect procedure time, or teaching time or supervisory time of PA/NP/Med student/Med Resident etc but could involve care discussion time    Dr. Kalman Shan, M.D., [facility].C.P Pulmonary and Critical Care Medicine Staff Physician [place] [place] Pulmonary and Critical Care Pager: [phone], If no answer or between  15:00h - 7:00h: call [phone]  April 18, 2014 7:16 PM

## 2014-04-01 NOTE — ED Notes (Signed)
Pt's family taken to consultation waiting room.

## 2014-04-01 NOTE — Procedures (Signed)
Extubation Procedure Note  Patient Details:   Name: [name] [name] DOB: 1950/05/24 MRN: [id number]   Airway Documentation:     Evaluation  O2 sats: stable throughout Complications: No apparent complications Patient did tolerate procedure well. Bilateral Breath Sounds: Clear, Diminished Suctioning: Airway Yes   Terminal extubation to 4L [place].   Gaetano Hawthorne 03/31/2014, 9:17 PM

## 2014-04-01 NOTE — ED Notes (Addendum)
Pt arrives with c/o groin pain, pt lethargic and confused upon triage. 101.2 F oral temperature at triage. Started when patient got up yesterday morning.

## 2014-04-01 NOTE — Progress Notes (Signed)
ANTIBIOTIC CONSULT NOTE - INITIAL  Pharmacy Consult for vanc and zosyn Indication: cellulitis, r/o sepsis  Allergies  Allergen Reactions  . Lipitor [Atorvastatin] Other (See Comments)    Weak muscles  . Lodine [Etodolac] Nausea And Vomiting  . Methotrexate Derivatives Nausea And Vomiting  . Remicade [Infliximab] Other (See Comments)    Chills and shakes   . Ranitidine Hcl Nausea Only    Patient Measurements: Height: 5\' 5"  (165.1 cm) Weight: 185 lb (83.915 kg) IBW/kg (Calculated) : 61.5   Vital Signs: Temp: 103.5 F (39.7 C) (01/03 0845) Temp Source: Rectal (01/03 0845) BP: 153/88 mmHg (01/03 0900) Pulse Rate: 147 (01/03 0900) Intake/Output from previous day:   Intake/Output from this shift: Total I/O In: 1700 [I.V.:1700] Out: 160 [Urine:160]  Labs:  Recent Labs  04/20/2014 0733  WBC 12.5*  HGB 9.6*  PLT 207  CREATININE 1.08   Estimated Creatinine Clearance: 71.6 mL/min (by C-G formula based on Cr of 1.08). No results for input(s): VANCOTROUGH, VANCOPEAK, VANCORANDOM, GENTTROUGH, GENTPEAK, GENTRANDOM, TOBRATROUGH, TOBRAPEAK, TOBRARND, AMIKACINPEAK, AMIKACINTROU, AMIKACIN in the last 72 hours.   Microbiology: No results found for this or any previous visit (from the past 720 hour(s)).  Medical History: Past Medical History  Diagnosis Date  . Diabetes mellitus   . Hypertension   . Hyperlipidemia   . Rheumatoid arthritis   . CAD (coronary artery disease)   . Myocardial infarction 2000 & 2006  . GERD (gastroesophageal reflux disease)   . Leg pain   . CHF (congestive heart failure)   . PAD (peripheral artery disease)   . Decubitus ulcer of right ankle   . 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 - single chamber ICD  . Shortness of breath     walking  . History of blood transfusion   . Chronic kidney disease     stage 2 per Dr 06/04/2012 notes.  . Sepsis     Medications: see med history  Assessment: 64 yo man to  start broad spectrum antibiotics for cellulitis. He was ordered 1 gm of vanc and 3.375 gm of zosyn in the ED.  CrCl ~ 70 ml/min   Goal of Therapy:  vanc trough 15-20  Plan:  Zosyn 3.375 gm IV q8 hours Vancomycin 1 gm IV q12 hours F/u renal function, cultures and clinical course  Thanks for allowing pharmacy to be a part of this patient's care.  79, PharmD Clinical Pharmacist, [phone]  04/25/2014,9:27 AM

## 2014-04-01 NOTE — Progress Notes (Signed)
[employer] representative notified of withdrawal of care, RN instructed per representative to place magnet over single chamber ICD. Representative en route, family educated in plan of care.  Corliss Skains RN

## 2014-04-01 NOTE — Sedation Documentation (Signed)
Successful intubation. Marked at  23 at the lip.

## 2014-04-01 NOTE — Progress Notes (Signed)
eLink Physician-Brief Progress Note Patient Name: [name] [name] DOB: Nov 12, 1950 MRN: [id number]   Date of Service  04/24/2014  HPI/Events of Note  Pos trop  eICU Interventions  ASA 325 x 1 On heparin IV     Intervention Category Intermediate Interventions: Diagnostic test evaluation  ALVA,RAKESH V. 03/31/2014, 4:34 PM

## 2014-04-01 NOTE — Procedures (Signed)
Intubation Procedure Note KAMAL JURGENS [id number] [birth year]  Procedure: Intubation Indications: Respiratory insufficiency  Procedure Details Consent: Risks of procedure as well as the alternatives and risks of each were explained to the (patient/caregiver).  Consent for procedure obtained. Time Out: Verified patient identification, verified procedure, site/side was marked, verified correct patient position, special equipment/implants available, medications/allergies/relevent history reviewed, required imaging and test results available.  Performed  Maximum sterile technique was used including cap, gloves, gown, hand hygiene and mask.  MAC    Evaluation Hemodynamic Status: Persistent hypotension treated with pressors; O2 sats: stable throughout Patient's Current Condition: stable Complications: No apparent complications Patient did tolerate procedure well. Chest X-ray ordered to verify placement.  CXR: pending.   Tavari Loadholt 04/07/2014  glidescoope used - easy intuybatioin. Grey secretins seen aroujnd cord.  No aspiration Dr. Kalman Shan, M.D., [facility].C.P Pulmonary and Critical Care Medicine Staff Physician [place] [facility] [place] Pulmonary and Critical Care Pager: [phone], If no answer or between  15:00h - 7:00h: call [phone]  04/04/2014 12:23 PM

## 2014-04-01 NOTE — Progress Notes (Signed)
ANTICOAGULATION CONSULT NOTE - Initial Consult  Pharmacy Consult for heparin Indication: atrial fibrillation  Allergies  Allergen Reactions  . Lipitor [Atorvastatin] Other (See Comments)    Weak muscles  . Lodine [Etodolac] Nausea And Vomiting  . Methotrexate Derivatives Nausea And Vomiting  . Remicade [Infliximab] Other (See Comments)    Chills and shakes   . Ranitidine Hcl Nausea Only    Patient Measurements: Height: 5\' 7"  (170.2 cm) Weight: 194 lb 7.1 oz (88.2 kg) IBW/kg (Calculated) : 66.1 Heparin Dosing Weight: 84kg  Vital Signs: Temp: 99 F (37.2 C) (01/03 1300) Temp Source: Oral (01/03 1300) BP: 101/62 mmHg (01/03 1236) Pulse Rate: 121 (01/03 1300)  Labs:  Recent Labs  04/09/2014 0733  HGB 9.6*  HCT 32.8*  PLT 207  LABPROT 22.8*  INR 1.99*  CREATININE 1.08    Estimated Creatinine Clearance: 76.1 mL/min (by C-G formula based on Cr of 1.08).   Medical History: Past Medical History  Diagnosis Date  . Diabetes mellitus   . Hypertension   . Hyperlipidemia   . Rheumatoid arthritis   . CAD (coronary artery disease)   . Myocardial infarction 2000 & 2006  . GERD (gastroesophageal reflux disease)   . Leg pain   . CHF (congestive heart failure)   . PAD (peripheral artery disease)   . Decubitus ulcer of right ankle   . 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 - single chamber ICD  . Shortness of breath     walking  . History of blood transfusion   . Chronic kidney disease     stage 2 per Dr 06/04/2012 notes.  . Sepsis    Assessment: 49 YOM admitted with suspected sepsis. PTA was on warfarin for AFib- home dose documented as 5mg  daily. Admission INR 1.99. To be on heparin while in ICU for AFib. Hgb 9.6, plts 207- no bleeding noted on admission.  Goal of Therapy:  Heparin level 0.3-0.7 units/ml Monitor platelets by anticoagulation protocol: Yes   Plan:  -start heparin NO BOLUS at 1100 units/hr -heparin level in  6 hours -daily HL and CBC -follow for s/s bleeding  Tanaia Hawkey D. Casyn Becvar, PharmD, BCPS Clinical Pharmacist Pager: [phone] 09-Apr-2014 1:20 PM

## 2014-04-01 NOTE — ED Notes (Signed)
Pt's HR 140-150's ST. Notified Md. Obtained EKG. Pt was sleeping, not moving around in bed. Pt shivering, however EKG confirms ST. Pt snoring, placed 2L [place]. Lungs clear.

## 2014-04-01 NOTE — ED Notes (Signed)
Moving pt to trauma B to intubate. Pt's family aware and pt aware. Dr. Marchelle Gearing at bedside discussing procedure with pt

## 2014-04-01 NOTE — ED Notes (Signed)
Called pharmacy for NEO

## 2014-04-01 NOTE — Progress Notes (Signed)
CRITICAL VALUE ALERT  Critical value received: Potassium = 6.8  Date of notification:  04-01-13  Time of notification:  1915  Critical value read back:Yes.    Nurse who received alert:  Corliss Skains RN  MD notified (1st page):  Dr. Marchelle Gearing  Time of first page: 1915  MD notified (2nd page):  Time of second page:  Responding MD:  Dr. Marchelle Gearing  Time MD responded: [phone]

## 2014-04-01 NOTE — ED Notes (Signed)
RSI kit at bedside. Consents obtained for central line and intubation. Dr. Chase Caller at bedside.

## 2014-04-02 DIAGNOSIS — Z515 Encounter for palliative care: Secondary | ICD-10-CM

## 2014-04-02 DIAGNOSIS — Z66 Do not resuscitate: Secondary | ICD-10-CM

## 2014-04-02 LAB — PREPARE FRESH FROZEN PLASMA
Unit division: 0
Unit division: 0

## 2014-04-02 LAB — URINE CULTURE
CULTURE: NO GROWTH
Colony Count: NO GROWTH

## 2014-04-03 LAB — LEGIONELLA ANTIGEN, URINE

## 2014-04-04 LAB — CULTURE, BLOOD (ROUTINE X 2)

## 2014-04-09 NOTE — Discharge Summary (Signed)
DISCHARGE SUMMARY    Date of admit: 04/25/2014  6:39 AM Date of discharge: 04-21-2014  2:11 AM Length of Stay: 1 days  PCP is STAMBAUGH,MERRIS, MD   PROBLEM LIST Active Problems:   Septic shock due to MSSA Bacteremia. Possible vascular graft infection NSTEMI    Lactic acidosis   Shock circulatory   Bilateral cold feet   NSTEMI (non-ST elevated myocardial infarction)   Acute hip pain   Acute renal failure   DNAR (do not attempt resuscitation)   Terminal care  OTHERs   Chronic systolic CHF (congestive heart failure), NYHA class 4  SUMMARY [name] [name] was 64 y.o. patient with    has a past medical history of Diabetes mellitus; Hypertension; Hyperlipidemia; Rheumatoid arthritis; CAD (coronary artery disease); Myocardial infarction (2000 & 2006); GERD (gastroesophageal reflux disease); Leg pain; CHF (congestive heart failure); PAD (peripheral artery disease); Decubitus ulcer of right ankle; Automatic implantable cardioverter-defibrillator in situ (04/06/2012); Shortness of breath; History of blood transfusion; Chronic kidney disease; and Sepsis.   has past surgical history that includes Hernia repair; Carpal tunnel release (Right); Knee surgery (Left); Cardiac defibrillator placement (04/08/2012); difibulator; Patch angioplasty (Right, 03/14/2013); abdominal aortagram (N/A, 03/14/2013); Insertion of iliac stent (Right, 03/14/2013); I&D extremity (Right, 04/26/2013); Patch angioplasty (Right, 04/26/2013); Coronary artery bypass graft (06); Colonoscopy; Insertion of iliac stent (Left, 2011); coronary stents (2004); Femoral-popliteal Bypass Graft (Left, 09/26/2013); Intraoperative arteriogram (Left, 09/26/2013); abdominal aortagram (N/A, 03/13/2013); and abdominal aortagram (N/A, 09/25/2013).   Admitted on 04/16/2014 with    64 year old with multiple medical prolbmes inlcuding RA (on orencia, arava, prednisone), IHD/CAD - s/p CABG, defib, And chronic systolic cHF ef 25%, (plavix  + copumadin + other meds) sever PVD (mutltiple interventions with stents and bypass and amputation Left great toe), DM (on insulin + meds), chronic pain (opioids + gabapentin ). Admitted to ER 04/15/2014 with few to several day hx of bilateral groin and LE pain along with redness of LLE (feels similar to prior vascular obsturction). IN ER - significant SIRS with occult septic shock (febrile, with lactate 3.9). Lactate did not clear with fluid bolus and subsequently becaome hypoxemic, hypotensive/shock needing pressors, and worsening respiratory distress. PCCM called to admit - patient intubated and central lined in ER 04/20/2014. Full code expressed by highly involved family. OVer course of ER R > L foot felt to be COLD  He was aggressively treated with broad antibiotics, pressors and vascular consult who requested CT of his groin area. However, patient was in continued refractory shock on multiple pressors and despite high doses of fentanyl still had severe pain. He and his family subsequently expressed desire for comfort and palliation. They have had discussions about this issue in past and even in the ER though insisted full code were ambivalent to an extent. During goals of care meeting it was learned that recent admit at [facility] with MI and did not have vessels to revascularize. TEsts at this time showed NSTEMI and subsequently after his death blood culture was positive for MSSA.   At his and family wish, terminal wean was performed. Patient kept comfortable to interact with family to extent possible and subsequently died 04/21/14  SIGNED Dr. Kalman Shan, M.D., [facility].C.P Pulmonary and Critical Care Medicine Staff Physician [facility] Pager: [phone], If no answer or between  15:00h - 7:00h: call [phone]  04/09/2014 7:12 AM

## 2014-04-30 NOTE — Progress Notes (Signed)
Dilaudid IV 65 ml wasted verified by two RN's; Odette Fraction RN and Victorino Dike RN

## 2014-04-30 NOTE — Progress Notes (Signed)
At 0211 pt asystolic, no lung or heart sounds auscultated, pupils fixed and dilated. Strip printed placed in chart, MD made aware, second RN Corliss Skains verified. CDS contacted, pt is not a candidate for organ, tissue, or eye donation.   Victorino Dike RN

## 2014-04-30 DEATH — deceased

## 2014-07-18 ENCOUNTER — Encounter (HOSPITAL_COMMUNITY): Payer: Medicare Other

## 2014-07-18 ENCOUNTER — Ambulatory Visit: Payer: Medicare Other | Admitting: Vascular Surgery

## 2014-07-18 ENCOUNTER — Other Ambulatory Visit (HOSPITAL_COMMUNITY): Payer: Medicare Other

## 2015-08-19 ENCOUNTER — Encounter: Payer: Self-pay | Admitting: Cardiology

## 2016-02-08 IMAGING — CR DG CHEST 2V
2 series · 2 of 2 positions shown · non-contrast
Comparison: 04/28/2013

CLINICAL DATA: Preop for from/pop bypass.  Coronary artery disease.

EXAM:
CHEST  2 VIEW

[w chest pa]
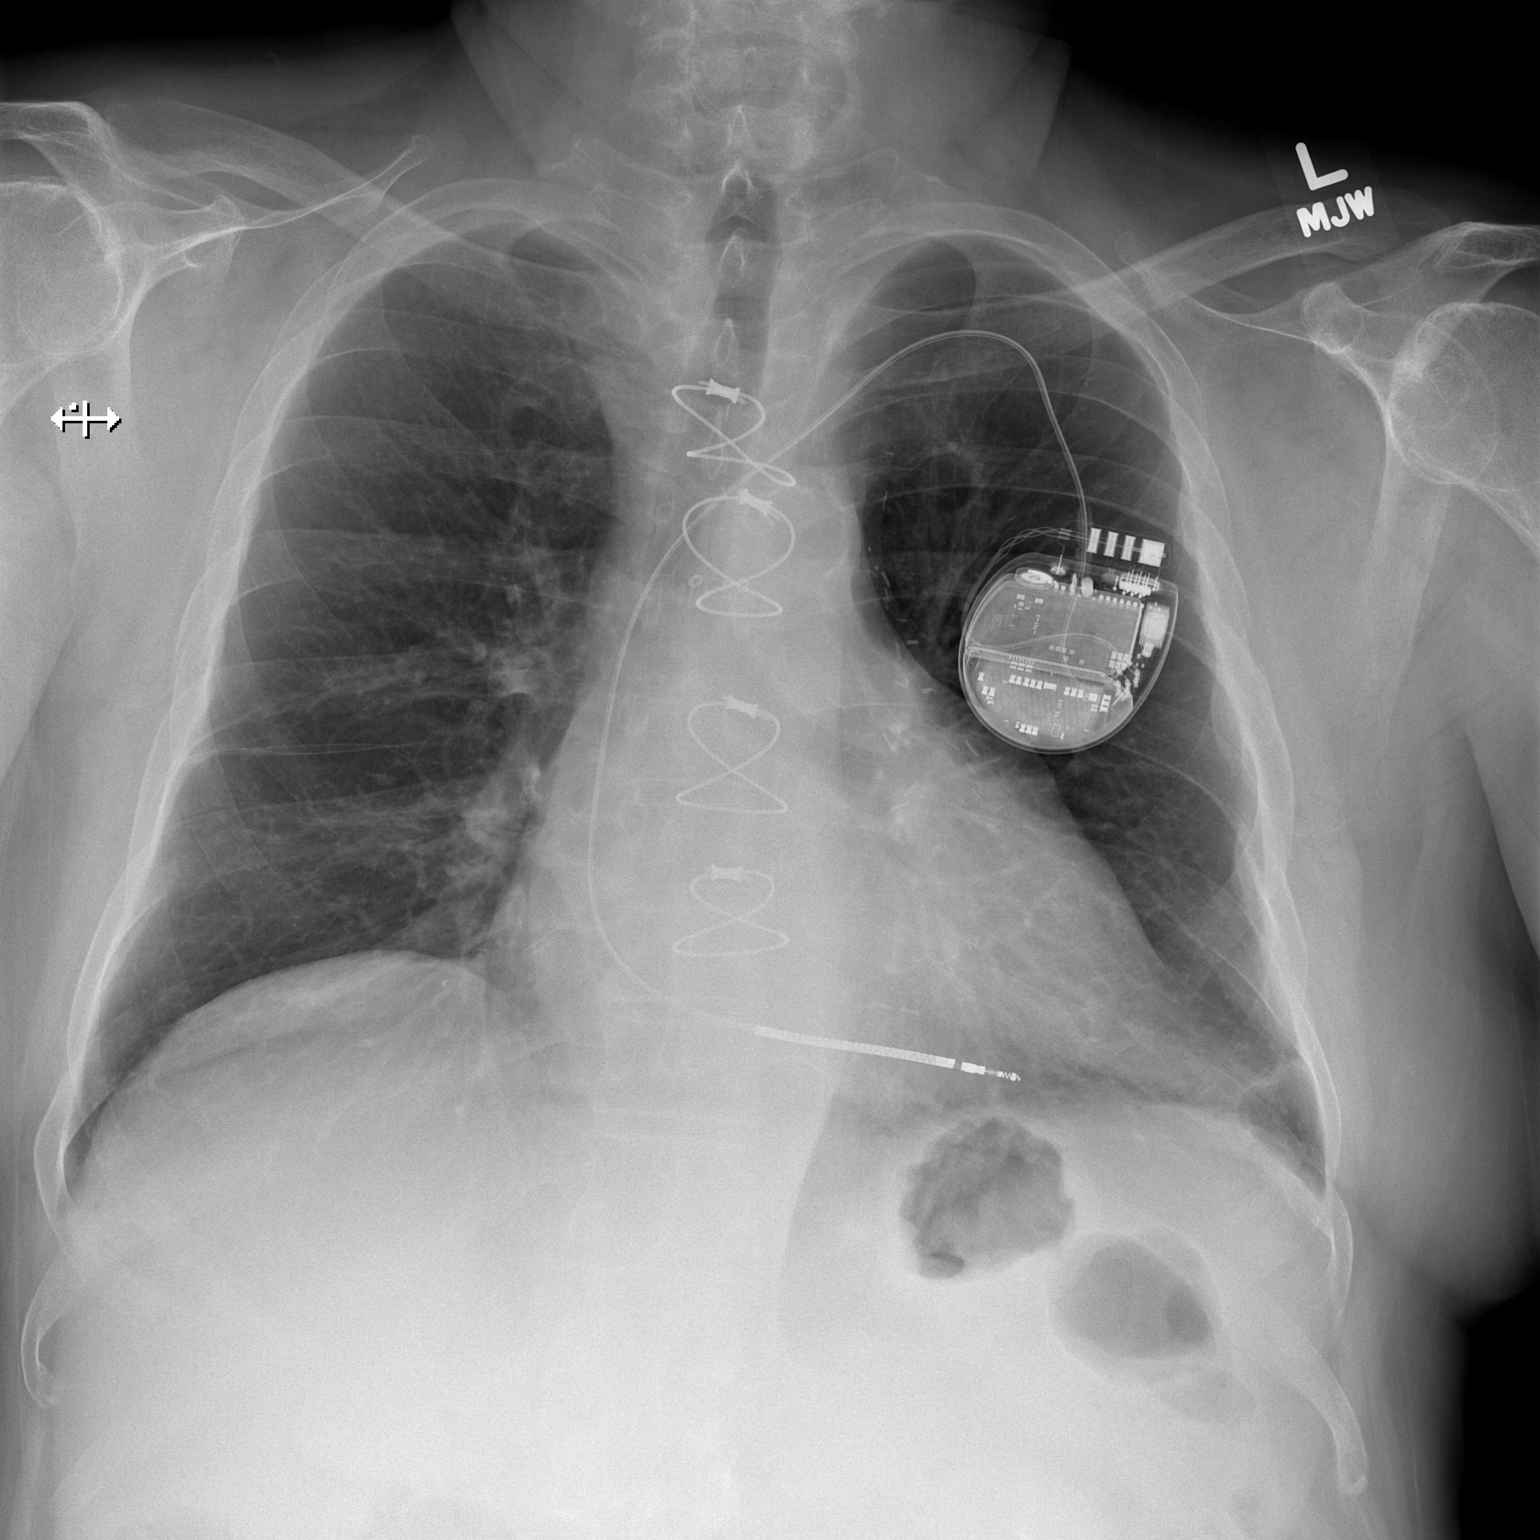

[w chest lat]
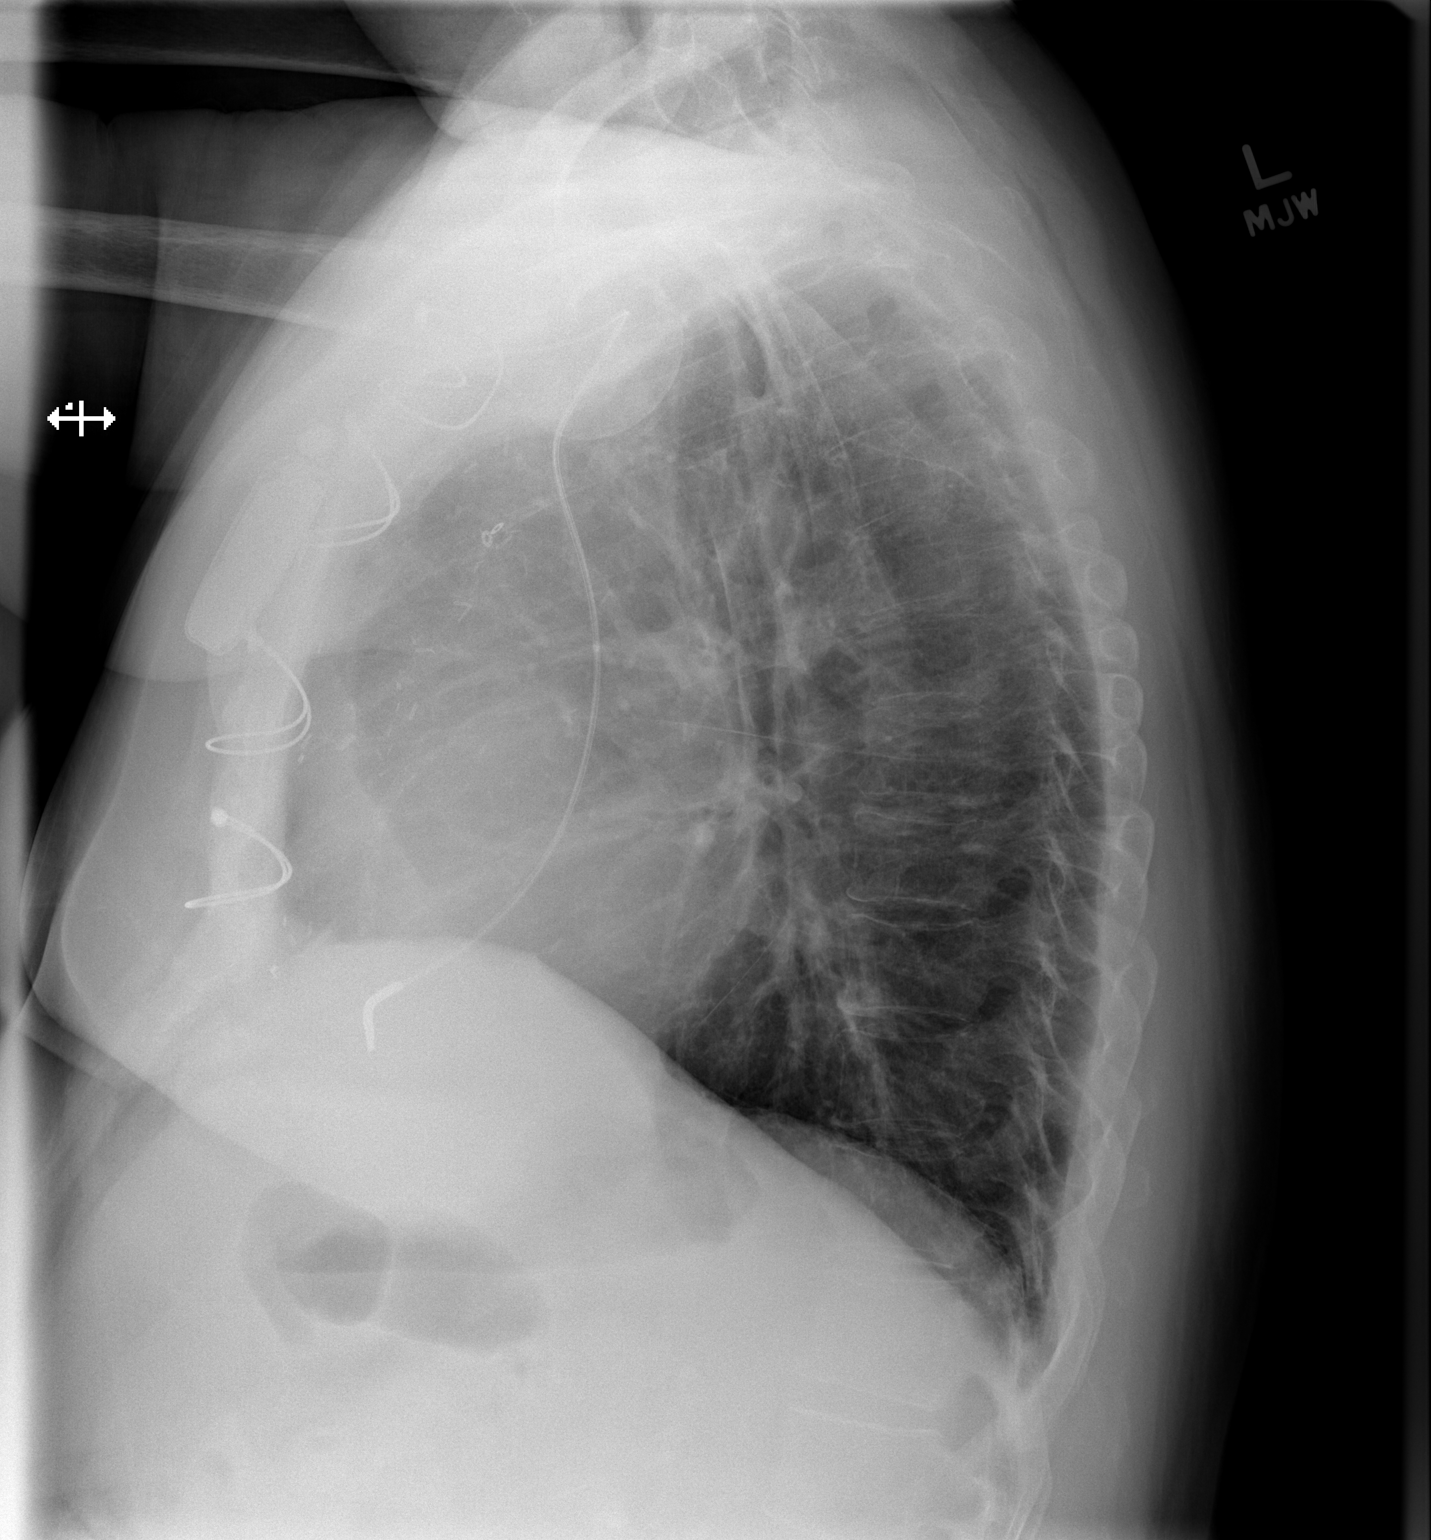

[2 of 2 positions shown; findings below may reference images not displayed]

FINDINGS: Midline trachea. Mild cardiomegaly with atherosclerosis in the
transverse aorta. Single lead AICD device with tip a right
ventricle. No pleural effusion or pneumothorax. Mild scarring at the
left lung base. No congestive failure.
IMPRESSION: Cardiomegaly, without congestive failure or acute disease.
